# Patient Record
Sex: Female | Born: 1970 | Race: White | Hispanic: No | Marital: Single | Smoking: Never smoker
Health system: Southern US, Community
[De-identification: ages and names within clinical notes are randomized; demographics above are authoritative.]

## PROBLEM LIST (undated history)

## (undated) DIAGNOSIS — G8929 Other chronic pain: Secondary | ICD-10-CM

## (undated) DIAGNOSIS — N319 Neuromuscular dysfunction of bladder, unspecified: Secondary | ICD-10-CM

## (undated) DIAGNOSIS — G629 Polyneuropathy, unspecified: Secondary | ICD-10-CM

## (undated) DIAGNOSIS — F329 Major depressive disorder, single episode, unspecified: Secondary | ICD-10-CM

## (undated) DIAGNOSIS — I10 Essential (primary) hypertension: Secondary | ICD-10-CM

## (undated) DIAGNOSIS — G4730 Sleep apnea, unspecified: Secondary | ICD-10-CM

## (undated) DIAGNOSIS — W3400XA Accidental discharge from unspecified firearms or gun, initial encounter: Secondary | ICD-10-CM

## (undated) DIAGNOSIS — E119 Type 2 diabetes mellitus without complications: Secondary | ICD-10-CM

## (undated) DIAGNOSIS — F32A Depression, unspecified: Secondary | ICD-10-CM

## (undated) DIAGNOSIS — J45909 Unspecified asthma, uncomplicated: Secondary | ICD-10-CM

## (undated) DIAGNOSIS — K219 Gastro-esophageal reflux disease without esophagitis: Secondary | ICD-10-CM

## (undated) DIAGNOSIS — F419 Anxiety disorder, unspecified: Secondary | ICD-10-CM

## (undated) DIAGNOSIS — E785 Hyperlipidemia, unspecified: Secondary | ICD-10-CM

## (undated) HISTORY — DX: Sleep apnea, unspecified: G47.30

## (undated) HISTORY — DX: Other chronic pain: G89.29

## (undated) HISTORY — PX: APPENDECTOMY: SHX54

## (undated) HISTORY — DX: Neuromuscular dysfunction of bladder, unspecified: N31.9

## (undated) HISTORY — PX: SPINE SURGERY: SHX786

## (undated) HISTORY — DX: Depression, unspecified: F32.A

## (undated) HISTORY — PX: CHOLECYSTECTOMY: SHX55

## (undated) HISTORY — DX: Gastro-esophageal reflux disease without esophagitis: K21.9

## (undated) HISTORY — PX: TUBAL LIGATION: SHX77

## (undated) HISTORY — DX: Hyperlipidemia, unspecified: E78.5

## (undated) HISTORY — PX: COLON SURGERY: SHX602

## (undated) HISTORY — DX: Major depressive disorder, single episode, unspecified: F32.9

## (undated) HISTORY — PX: ABLATION: SHX5711

## (undated) HISTORY — DX: Unspecified asthma, uncomplicated: J45.909

## (undated) HISTORY — PX: SMALL INTESTINE SURGERY: SHX150

## (undated) HISTORY — DX: Polyneuropathy, unspecified: G62.9

## (undated) HISTORY — DX: Anxiety disorder, unspecified: F41.9

---

## 1998-12-02 DIAGNOSIS — F4310 Post-traumatic stress disorder, unspecified: Secondary | ICD-10-CM | POA: Insufficient documentation

## 1998-12-02 HISTORY — PX: LAMINECTOMY: SHX219

## 2006-06-23 ENCOUNTER — Emergency Department: Payer: Self-pay | Admitting: Emergency Medicine

## 2007-02-05 ENCOUNTER — Emergency Department: Payer: Self-pay | Admitting: Unknown Physician Specialty

## 2009-06-06 DIAGNOSIS — N920 Excessive and frequent menstruation with regular cycle: Secondary | ICD-10-CM | POA: Insufficient documentation

## 2009-12-28 ENCOUNTER — Emergency Department: Payer: Self-pay | Admitting: Emergency Medicine

## 2010-10-01 DIAGNOSIS — N840 Polyp of corpus uteri: Secondary | ICD-10-CM | POA: Insufficient documentation

## 2011-02-20 ENCOUNTER — Emergency Department: Payer: Self-pay | Admitting: Emergency Medicine

## 2013-04-29 LAB — HEPATIC FUNCTION PANEL
ALT: 27 U/L (ref 7–35)
AST: 23 U/L (ref 13–35)
Alkaline Phosphatase: 103 U/L (ref 25–125)
Bilirubin, Total: 0.3 mg/dL

## 2013-04-29 LAB — TSH: TSH: 1.35 u[IU]/mL (ref <=5.90)

## 2013-04-29 LAB — BASIC METABOLIC PANEL
BUN: 14 mg/dL (ref 4–21)
CREATININE: 0.6 mg/dL (ref <=1.1)
Glucose: 115 mg/dL
POTASSIUM: 4.2 mmol/L (ref 3.4–5.3)
SODIUM: 138 mmol/L (ref 137–147)

## 2013-04-29 LAB — CBC AND DIFFERENTIAL
HEMATOCRIT: 38 % (ref 36–46)
Hemoglobin: 13.1 g/dL (ref 12.0–16.0)
NEUTROS ABS: 4 /uL
Platelets: 288 10*3/uL (ref 150–399)
WBC: 6.6 10^3/mL

## 2013-04-29 LAB — LIPID PANEL
CHOLESTEROL: 208 mg/dL — AB (ref 0–200)
HDL: 42 mg/dL (ref 35–70)
LDL Cholesterol: 112 mg/dL
Triglycerides: 271 mg/dL — AB (ref 40–160)

## 2013-04-30 DIAGNOSIS — R739 Hyperglycemia, unspecified: Secondary | ICD-10-CM | POA: Insufficient documentation

## 2013-09-02 LAB — HM PAP SMEAR: HM Pap smear: NORMAL

## 2014-06-17 LAB — HEMOGLOBIN A1C: HEMOGLOBIN A1C: 5.5

## 2015-02-01 ENCOUNTER — Emergency Department: Payer: Self-pay | Admitting: Emergency Medicine

## 2015-02-01 DIAGNOSIS — M545 Low back pain: Secondary | ICD-10-CM | POA: Diagnosis not present

## 2015-04-06 DIAGNOSIS — H6121 Impacted cerumen, right ear: Secondary | ICD-10-CM | POA: Diagnosis not present

## 2015-04-06 DIAGNOSIS — L309 Dermatitis, unspecified: Secondary | ICD-10-CM | POA: Diagnosis not present

## 2015-04-25 DIAGNOSIS — J069 Acute upper respiratory infection, unspecified: Secondary | ICD-10-CM | POA: Diagnosis not present

## 2015-05-04 ENCOUNTER — Telehealth: Payer: Self-pay | Admitting: Family Medicine

## 2015-05-04 NOTE — Telephone Encounter (Signed)
Pt called to request a refill for OXYCODONE 5MG .  AV#[phone]/WGCB#[phone]/MJ

## 2015-06-01 ENCOUNTER — Other Ambulatory Visit: Payer: Self-pay | Admitting: Family Medicine

## 2015-06-01 DIAGNOSIS — G8929 Other chronic pain: Secondary | ICD-10-CM | POA: Insufficient documentation

## 2015-06-01 DIAGNOSIS — G8921 Chronic pain due to trauma: Secondary | ICD-10-CM

## 2015-06-01 MED ORDER — OXYCODONE HCL 5 MG PO TABS: ORAL_TABLET | ORAL | Status: DC

## 2015-06-01 NOTE — Telephone Encounter (Signed)
Last Refill 05/05/2015  Thanks,   -Vernona RiegerLaura

## 2015-06-01 NOTE — Telephone Encounter (Signed)
Prescription printed. Please notify patient it is ready for pick up. Thanks- Dr. Naomia Lenderman.  

## 2015-06-01 NOTE — Telephone Encounter (Signed)
This is a Dr. Sherrie MustacheFisher pt. Pt contacted office for refill request on the following medications: Oxycodone 5 mg Thanks TNP

## 2015-07-03 ENCOUNTER — Other Ambulatory Visit: Payer: Self-pay | Admitting: Family Medicine

## 2015-07-03 DIAGNOSIS — G8921 Chronic pain due to trauma: Secondary | ICD-10-CM

## 2015-07-03 NOTE — Telephone Encounter (Signed)
Pt contacted office for refill request on the following medications:  OXYCODONE  IMMEDIATE REL TABS.  UJ#[phone]/FA

## 2015-07-04 MED ORDER — OXYCODONE HCL 5 MG PO TABS: ORAL_TABLET | ORAL | Status: DC

## 2015-08-03 ENCOUNTER — Other Ambulatory Visit: Payer: Self-pay | Admitting: Family Medicine

## 2015-08-03 DIAGNOSIS — G8921 Chronic pain due to trauma: Secondary | ICD-10-CM

## 2015-08-03 NOTE — Telephone Encounter (Signed)
Pt contacted office for refill request on the following medications: oxyCODONE (OXY IR/ROXICODONE) 5 MG immediate release tablet. Pt would like this ready by tomorrow afternoon due to the Holiday and that we will be closed Monday. Thanks TNP

## 2015-08-04 ENCOUNTER — Telehealth: Payer: Self-pay | Admitting: Family Medicine

## 2015-08-04 MED ORDER — OXYCODONE HCL 5 MG PO TABS: ORAL_TABLET | ORAL | Status: DC

## 2015-08-31 ENCOUNTER — Other Ambulatory Visit: Payer: Self-pay | Admitting: Family Medicine

## 2015-08-31 DIAGNOSIS — G8921 Chronic pain due to trauma: Secondary | ICD-10-CM

## 2015-08-31 NOTE — Telephone Encounter (Signed)
Pt contacted office for refill request on the following medications:  oxyCODONE (OXY IR/ROXICODONE) 5 MG immediate release tablet.  CB#[phone]/MW ° °

## 2015-09-01 MED ORDER — OXYCODONE HCL 5 MG PO TABS: ORAL_TABLET | ORAL | Status: DC

## 2015-09-01 NOTE — Telephone Encounter (Signed)
Last written on 08/04/15 with no refills. Last OV was 04/25/15.

## 2015-10-05 ENCOUNTER — Other Ambulatory Visit: Payer: Self-pay | Admitting: Family Medicine

## 2015-10-05 ENCOUNTER — Telehealth: Payer: Self-pay | Admitting: Family Medicine

## 2015-10-05 DIAGNOSIS — G8921 Chronic pain due to trauma: Secondary | ICD-10-CM

## 2015-10-05 NOTE — Telephone Encounter (Signed)
Pt contacted office for refill request on the following medications:  oxyCODONE (OXY IR/ROXICODONE) 5 MG immediate release tablet.  ZO#[phone]/WJCB#[phone]/MW

## 2015-10-05 NOTE — Telephone Encounter (Signed)
Pt contacted office for refill request on the following medications[facility] [address]  CB#[phone]/MW    Gabapentin 600mg   Tizanidine HCT 50mg   Tramadol HCI 50mg   Alprazolam 0.5mg   Sertraline HCI 100mg   MetroGel-Viginal 0.75%

## 2015-10-06 ENCOUNTER — Other Ambulatory Visit: Payer: Self-pay | Admitting: Family Medicine

## 2015-10-06 MED ORDER — GABAPENTIN 600 MG PO TABS: 600.0000 mg | ORAL_TABLET | Freq: Three times a day (TID) | ORAL | Status: DC

## 2015-10-06 MED ORDER — SERTRALINE HCL 100 MG PO TABS: 100.0000 mg | ORAL_TABLET | Freq: Every day | ORAL | Status: DC

## 2015-10-06 MED ORDER — TIZANIDINE HCL 4 MG PO TABS: ORAL_TABLET | ORAL | Status: DC

## 2015-10-06 MED ORDER — ALPRAZOLAM 0.5 MG PO TABS: 0.2500 mg | ORAL_TABLET | Freq: Every day | ORAL | Status: DC | PRN

## 2015-10-06 MED ORDER — TRAMADOL HCL 50 MG PO TABS: 50.0000 mg | ORAL_TABLET | Freq: Three times a day (TID) | ORAL | Status: DC | PRN

## 2015-10-06 MED ORDER — OXYCODONE HCL 5 MG PO TABS
ORAL_TABLET | ORAL | Status: DC
Start: 2015-10-06 — End: 2015-11-03

## 2015-10-10 ENCOUNTER — Other Ambulatory Visit: Payer: Self-pay | Admitting: Family Medicine

## 2015-11-03 ENCOUNTER — Other Ambulatory Visit: Payer: Self-pay | Admitting: Family Medicine

## 2015-11-03 DIAGNOSIS — G8921 Chronic pain due to trauma: Secondary | ICD-10-CM

## 2015-11-03 MED ORDER — OXYCODONE HCL 5 MG PO TABS: ORAL_TABLET | ORAL | Status: DC

## 2015-11-03 NOTE — Telephone Encounter (Signed)
Pt call for a refill oxyCODONE (OXY IR/ROXICODONE) 5 MG immediate release tablet 10/06/15 -- Malva Limesonald E Fisher, MD Take 1 and 1/2 three times a day She will run out Saturday  Call back is [phone]  Thanks Barth Kirkseri

## 2015-12-01 ENCOUNTER — Telehealth: Payer: Self-pay | Admitting: Family Medicine

## 2015-12-01 DIAGNOSIS — G8921 Chronic pain due to trauma: Secondary | ICD-10-CM

## 2015-12-01 NOTE — Telephone Encounter (Signed)
Pt needs refil oxyCODONE (OXY IR/ROXICODONE) 5 MG immediate release tablet  Can pick up Tuesday.  Thanks, Fortune Brandsteri

## 2015-12-01 NOTE — Telephone Encounter (Signed)
Last OV: 04/25/2015  Last Refill: 11/03/2015

## 2015-12-05 MED ORDER — OXYCODONE HCL 5 MG PO TABS: ORAL_TABLET | ORAL | Status: DC

## 2015-12-05 NOTE — Telephone Encounter (Signed)
Notified rx is ready to pick up.

## 2015-12-05 NOTE — Telephone Encounter (Signed)
Pt has called back asking about refill.  Call back is  [phone]

## 2016-01-02 ENCOUNTER — Other Ambulatory Visit: Payer: Self-pay | Admitting: Family Medicine

## 2016-01-02 DIAGNOSIS — G8921 Chronic pain due to trauma: Secondary | ICD-10-CM

## 2016-01-02 MED ORDER — OXYCODONE HCL 5 MG PO TABS: ORAL_TABLET | ORAL | Status: DC

## 2016-01-02 NOTE — Telephone Encounter (Signed)
Pt contacted office for refill request on the following medications: °oxyCODONE (OXY IR/ROXICODONE) 5 MG immediate release tablet. Thanks TNP °

## 2016-01-31 ENCOUNTER — Other Ambulatory Visit: Payer: Self-pay | Admitting: Family Medicine

## 2016-01-31 DIAGNOSIS — G8921 Chronic pain due to trauma: Secondary | ICD-10-CM

## 2016-01-31 NOTE — Telephone Encounter (Signed)
Pt needs refill on her oxyCODONE (OXY IR/ROXICODONE) 5 MG immediate release tablet  Thanks teri

## 2016-02-01 MED ORDER — OXYCODONE HCL 5 MG PO TABS
ORAL_TABLET | ORAL | Status: DC
Start: 2016-02-01 — End: 2016-03-01

## 2016-02-03 ENCOUNTER — Other Ambulatory Visit: Payer: Self-pay | Admitting: Family Medicine

## 2016-02-05 DIAGNOSIS — R609 Edema, unspecified: Secondary | ICD-10-CM | POA: Insufficient documentation

## 2016-02-05 DIAGNOSIS — Z8742 Personal history of other diseases of the female genital tract: Secondary | ICD-10-CM | POA: Insufficient documentation

## 2016-02-05 DIAGNOSIS — M2550 Pain in unspecified joint: Secondary | ICD-10-CM | POA: Insufficient documentation

## 2016-02-05 DIAGNOSIS — K219 Gastro-esophageal reflux disease without esophagitis: Secondary | ICD-10-CM | POA: Insufficient documentation

## 2016-02-05 DIAGNOSIS — O009 Unspecified ectopic pregnancy without intrauterine pregnancy: Secondary | ICD-10-CM | POA: Insufficient documentation

## 2016-02-05 DIAGNOSIS — F32A Depression, unspecified: Secondary | ICD-10-CM | POA: Insufficient documentation

## 2016-02-05 DIAGNOSIS — Z8349 Family history of other endocrine, nutritional and metabolic diseases: Secondary | ICD-10-CM | POA: Insufficient documentation

## 2016-02-05 DIAGNOSIS — F329 Major depressive disorder, single episode, unspecified: Secondary | ICD-10-CM | POA: Insufficient documentation

## 2016-02-05 DIAGNOSIS — E669 Obesity, unspecified: Secondary | ICD-10-CM | POA: Insufficient documentation

## 2016-02-05 DIAGNOSIS — F410 Panic disorder [episodic paroxysmal anxiety] without agoraphobia: Secondary | ICD-10-CM | POA: Insufficient documentation

## 2016-02-05 DIAGNOSIS — H6120 Impacted cerumen, unspecified ear: Secondary | ICD-10-CM | POA: Insufficient documentation

## 2016-02-05 DIAGNOSIS — E781 Pure hyperglyceridemia: Secondary | ICD-10-CM | POA: Insufficient documentation

## 2016-02-05 DIAGNOSIS — L309 Dermatitis, unspecified: Secondary | ICD-10-CM | POA: Insufficient documentation

## 2016-02-05 NOTE — Telephone Encounter (Signed)
Please call in tramadol.  

## 2016-02-05 NOTE — Telephone Encounter (Signed)
Rx called in to pharmacy. 

## 2016-02-06 ENCOUNTER — Ambulatory Visit: Payer: Self-pay | Admitting: Family Medicine

## 2016-02-09 ENCOUNTER — Other Ambulatory Visit: Payer: Self-pay | Admitting: Family Medicine

## 2016-02-09 NOTE — Telephone Encounter (Signed)
Please call in alprazolam.  

## 2016-02-09 NOTE — Telephone Encounter (Signed)
Rx called in to pharmacy. 

## 2016-02-12 ENCOUNTER — Encounter: Payer: Self-pay | Admitting: Physician Assistant

## 2016-02-12 ENCOUNTER — Ambulatory Visit (INDEPENDENT_AMBULATORY_CARE_PROVIDER_SITE_OTHER): Payer: Medicare Other | Admitting: Physician Assistant

## 2016-02-12 VITALS — BP 110/78 | HR 102 | Temp 98.0°F | Resp 16 | Wt 200.0 lb

## 2016-02-12 DIAGNOSIS — E781 Pure hyperglyceridemia: Secondary | ICD-10-CM | POA: Diagnosis not present

## 2016-02-12 DIAGNOSIS — Z8742 Personal history of other diseases of the female genital tract: Secondary | ICD-10-CM | POA: Diagnosis not present

## 2016-02-12 DIAGNOSIS — R739 Hyperglycemia, unspecified: Secondary | ICD-10-CM | POA: Diagnosis not present

## 2016-02-12 DIAGNOSIS — Z0000 Encounter for general adult medical examination without abnormal findings: Secondary | ICD-10-CM

## 2016-02-12 DIAGNOSIS — Z124 Encounter for screening for malignant neoplasm of cervix: Secondary | ICD-10-CM

## 2016-02-12 DIAGNOSIS — Z1239 Encounter for other screening for malignant neoplasm of breast: Secondary | ICD-10-CM | POA: Diagnosis not present

## 2016-02-12 NOTE — Patient Instructions (Addendum)
Health Maintenance, Female Adopting a healthy lifestyle and getting preventive care can go a long way to promote health and wellness. Talk with your health care provider about what schedule of regular examinations is right for you. This is a good chance for you to check in with your provider about disease prevention and staying healthy. In between checkups, there are plenty of things you can do on your own. Experts have done a lot of research about which lifestyle changes and preventive measures are most likely to keep you healthy. Ask your health care provider for more information. WEIGHT AND DIET  Eat a healthy diet  Be sure to include plenty of vegetables, fruits, low-fat dairy products, and lean protein.  Do not eat a lot of foods high in solid fats, added sugars, or salt.  Get regular exercise. This is one of the most important things you can do for your health.  Most adults should exercise for at least 150 minutes each week. The exercise should increase your heart rate and make you sweat (moderate-intensity exercise).  Most adults should also do strengthening exercises at least twice a week. This is in addition to the moderate-intensity exercise.  Maintain a healthy weight  Body mass index (BMI) is a measurement that can be used to identify possible weight problems. It estimates body fat based on height and weight. Your health care provider can help determine your BMI and help you achieve or maintain a healthy weight.  For females 28 years of age and older:   A BMI below 18.5 is considered underweight.  A BMI of 18.5 to 24.9 is normal.  A BMI of 25 to 29.9 is considered overweight.  A BMI of 30 and above is considered obese.  Watch levels of cholesterol and blood lipids  You should start having your blood tested for lipids and cholesterol at 43 years of age, then have this test every 5 years.  You may need to have your cholesterol levels checked more often if:  Your lipid  or cholesterol levels are high.  You are older than 43 years of age.  You are at high risk for heart disease.  CANCER SCREENING   Lung Cancer  Lung cancer screening is recommended for adults 75-66 years old who are at high risk for lung cancer because of a history of smoking.  A yearly low-dose CT scan of the lungs is recommended for people who:  Currently smoke.  Have quit within the past 15 years.  Have at least a 30-pack-year history of smoking. A pack year is smoking an average of one pack of cigarettes a day for 1 year.  Yearly screening should continue until it has been 15 years since you quit.  Yearly screening should stop if you develop a health problem that would prevent you from having lung cancer treatment.  Breast Cancer  Practice breast self-awareness. This means understanding how your breasts normally appear and feel.  It also means doing regular breast self-exams. Let your health care provider know about any changes, no matter how small.  If you are in your 20s or 30s, you should have a clinical breast exam (CBE) by a health care provider every 1-3 years as part of a regular health exam.  If you are 25 or older, have a CBE every year. Also consider having a breast X-ray (mammogram) every year.  If you have a family history of breast cancer, talk to your health care provider about genetic screening.  If you  are at high risk for breast cancer, talk to your health care provider about having an MRI and a mammogram every year.  Breast cancer gene (BRCA) assessment is recommended for women who have family members with BRCA-related cancers. BRCA-related cancers include:  Breast.  Ovarian.  Tubal.  Peritoneal cancers.  Results of the assessment will determine the need for genetic counseling and BRCA1 and BRCA2 testing. Cervical Cancer Your health care provider may recommend that you be screened regularly for cancer of the pelvic organs (ovaries, uterus, and  vagina). This screening involves a pelvic examination, including checking for microscopic changes to the surface of your cervix (Pap test). You may be encouraged to have this screening done every 3 years, beginning at age 21.  For women ages 30-65, health care providers may recommend pelvic exams and Pap testing every 3 years, or they may recommend the Pap and pelvic exam, combined with testing for human papilloma virus (HPV), every 5 years. Some types of HPV increase your risk of cervical cancer. Testing for HPV may also be done on women of any age with unclear Pap test results.  Other health care providers may not recommend any screening for nonpregnant women who are considered low risk for pelvic cancer and who do not have symptoms. Ask your health care provider if a screening pelvic exam is right for you.  If you have had past treatment for cervical cancer or a condition that could lead to cancer, you need Pap tests and screening for cancer for at least 20 years after your treatment. If Pap tests have been discontinued, your risk factors (such as having a new sexual partner) need to be reassessed to determine if screening should resume. Some women have medical problems that increase the chance of getting cervical cancer. In these cases, your health care provider may recommend more frequent screening and Pap tests. Colorectal Cancer  This type of cancer can be detected and often prevented.  Routine colorectal cancer screening usually begins at 43 years of age and continues through 43 years of age.  Your health care provider may recommend screening at an earlier age if you have risk factors for colon cancer.  Your health care provider may also recommend using home test kits to check for hidden blood in the stool.  A small camera at the end of a tube can be used to examine your colon directly (sigmoidoscopy or colonoscopy). This is done to check for the earliest forms of colorectal  cancer.  Routine screening usually begins at age 50.  Direct examination of the colon should be repeated every 5-10 years through 43 years of age. However, you may need to be screened more often if early forms of precancerous polyps or small growths are found. Skin Cancer  Check your skin from head to toe regularly.  Tell your health care provider about any new moles or changes in moles, especially if there is a change in a mole's shape or color.  Also tell your health care provider if you have a mole that is larger than the size of a pencil eraser.  Always use sunscreen. Apply sunscreen liberally and repeatedly throughout the day.  Protect yourself by wearing long sleeves, pants, a wide-brimmed hat, and sunglasses whenever you are outside. HEART DISEASE, DIABETES, AND HIGH BLOOD PRESSURE   High blood pressure causes heart disease and increases the risk of stroke. High blood pressure is more likely to develop in:  People who have blood pressure in the high end   of the normal range (130-139/85-89 mm Hg).  People who are overweight or obese.  People who are African American.  If you are 38-23 years of age, have your blood pressure checked every 3-5 years. If you are 61 years of age or older, have your blood pressure checked every year. You should have your blood pressure measured twice--once when you are at a hospital or clinic, and once when you are not at a hospital or clinic. Record the average of the two measurements. To check your blood pressure when you are not at a hospital or clinic, you can use:  An automated blood pressure machine at a pharmacy.  A home blood pressure monitor.  If you are between 45 years and 39 years old, ask your health care provider if you should take aspirin to prevent strokes.  Have regular diabetes screenings. This involves taking a blood sample to check your fasting blood sugar level.  If you are at a normal weight and have a low risk for diabetes,  have this test once every three years after 43 years of age.  If you are overweight and have a high risk for diabetes, consider being tested at a younger age or more often. PREVENTING INFECTION  Hepatitis B  If you have a higher risk for hepatitis B, you should be screened for this virus. You are considered at high risk for hepatitis B if:  You were born in a country where hepatitis B is common. Ask your health care provider which countries are considered high risk.  Your parents were born in a high-risk country, and you have not been immunized against hepatitis B (hepatitis B vaccine).  You have HIV or AIDS.  You use needles to inject street drugs.  You live with someone who has hepatitis B.  You have had sex with someone who has hepatitis B.  You get hemodialysis treatment.  You take certain medicines for conditions, including cancer, organ transplantation, and autoimmune conditions. Hepatitis C  Blood testing is recommended for:  Everyone born from [birth year] through 1963.  Anyone with known risk factors for hepatitis C. Sexually transmitted infections (STIs)  You should be screened for sexually transmitted infections (STIs) including gonorrhea and chlamydia if:  You are sexually active and are younger than 43 years of age.  You are older than 43 years of age and your health care provider tells you that you are at risk for this type of infection.  Your sexual activity has changed since you were last screened and you are at an increased risk for chlamydia or gonorrhea. Ask your health care provider if you are at risk.  If you do not have HIV, but are at risk, it may be recommended that you take a prescription medicine daily to prevent HIV infection. This is called pre-exposure prophylaxis (PrEP). You are considered at risk if:  You are sexually active and do not regularly use condoms or know the HIV status of your partner(s).  You take drugs by injection.  You are sexually  active with a partner who has HIV. Talk with your health care provider about whether you are at high risk of being infected with HIV. If you choose to begin PrEP, you should first be tested for HIV. You should then be tested every 3 months for as long as you are taking PrEP.  PREGNANCY   If you are premenopausal and you may become pregnant, ask your health care provider about preconception counseling.  If you may  become pregnant, take 400 to 800 micrograms (mcg) of folic acid every day.  If you want to prevent pregnancy, talk to your health care provider about birth control (contraception). OSTEOPOROSIS AND MENOPAUSE   Osteoporosis is a disease in which the bones lose minerals and strength with aging. This can result in serious bone fractures. Your risk for osteoporosis can be identified using a bone density scan.  If you are 61 years of age or older, or if you are at risk for osteoporosis and fractures, ask your health care provider if you should be screened.  Ask your health care provider whether you should take a calcium or vitamin D supplement to lower your risk for osteoporosis.  Menopause may have certain physical symptoms and risks.  Hormone replacement therapy may reduce some of these symptoms and risks. Talk to your health care provider about whether hormone replacement therapy is right for you.  HOME CARE INSTRUCTIONS   Schedule regular health, dental, and eye exams.  Stay current with your immunizations.   Do not use any tobacco products including cigarettes, chewing tobacco, or electronic cigarettes.  If you are pregnant, do not drink alcohol.  If you are breastfeeding, limit how much and how often you drink alcohol.  Limit alcohol intake to no more than 1 drink per day for nonpregnant women. One drink equals 12 ounces of beer, 5 ounces of wine, or 1 ounces of hard liquor.  Do not use street drugs.  Do not share needles.  Ask your health care provider for help if  you need support or information about quitting drugs.  Tell your health care provider if you often feel depressed.  Tell your health care provider if you have ever been abused or do not feel safe at home.   This information is not intended to replace advice given to you by your health care provider. Make sure you discuss any questions you have with your health care provider.   Document Released: 06/03/2011 Document Revised: 12/09/2014 Document Reviewed: 10/20/2013 Elsevier Interactive Patient Education [employer].
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
## 2016-02-12 NOTE — Addendum Note (Signed)
Addended by: Margaretann LovelessBURNETTE, JENNIFER M on: 02/12/2016 11:24 AM   Modules accepted: Level of Service

## 2016-02-13 ENCOUNTER — Telehealth: Payer: Self-pay

## 2016-02-13 LAB — LIPID PANEL
Chol/HDL Ratio: 5.7 ratio units — ABNORMAL HIGH (ref 0.0–4.4)
Cholesterol, Total: 218 mg/dL — ABNORMAL HIGH (ref 100–199)
HDL: 38 mg/dL — ABNORMAL LOW (ref >39)
LDL Calculated: 120 mg/dL — ABNORMAL HIGH (ref 0–99)
Triglycerides: 302 mg/dL — ABNORMAL HIGH (ref 0–149)
VLDL Cholesterol Cal: 60 mg/dL — ABNORMAL HIGH (ref 5–40)

## 2016-02-13 LAB — CBC WITH DIFFERENTIAL/PLATELET
Basophils Absolute: 0 10*3/uL (ref 0.0–0.2)
Basos: 0 %
EOS (ABSOLUTE): 0.1 10*3/uL (ref 0.0–0.4)
EOS: 1 %
HEMATOCRIT: 41.2 % (ref 34.0–46.6)
HEMOGLOBIN: 13.9 g/dL (ref 11.1–15.9)
IMMATURE GRANULOCYTES: 1 %
Immature Grans (Abs): 0.1 10*3/uL (ref 0.0–0.1)
LYMPHS: 26 %
Lymphocytes Absolute: 1.9 10*3/uL (ref 0.7–3.1)
MCH: 29.4 pg (ref 26.6–33.0)
MCHC: 33.7 g/dL (ref 31.5–35.7)
MCV: 87 fL (ref 79–97)
MONOCYTES: 5 %
Monocytes Absolute: 0.3 10*3/uL (ref 0.1–0.9)
NEUTROS PCT: 67 %
Neutrophils Absolute: 4.9 10*3/uL (ref 1.4–7.0)
Platelets: 299 10*3/uL (ref 150–379)
RBC: 4.72 x10E6/uL (ref 3.77–5.28)
RDW: 12.5 % (ref 12.3–15.4)
WBC: 7.2 10*3/uL (ref 3.4–10.8)

## 2016-02-13 LAB — COMPREHENSIVE METABOLIC PANEL
ALT: 39 IU/L — ABNORMAL HIGH (ref 0–32)
AST: 26 IU/L (ref 0–40)
Albumin/Globulin Ratio: 1.5 (ref 1.2–2.2)
Albumin: 4.6 g/dL (ref 3.5–5.5)
Alkaline Phosphatase: 113 IU/L (ref 39–117)
BUN / CREAT RATIO: 16 (ref 9–23)
BUN: 12 mg/dL (ref 6–24)
Bilirubin Total: 0.5 mg/dL (ref 0.0–1.2)
CALCIUM: 9.7 mg/dL (ref 8.7–10.2)
CHLORIDE: 102 mmol/L (ref 96–106)
CO2: 25 mmol/L (ref 18–29)
CREATININE: 0.75 mg/dL (ref 0.57–1.00)
GFR calc non Af Amer: 99 mL/min/{1.73_m2} (ref >59)
GFR, EST AFRICAN AMERICAN: 114 mL/min/{1.73_m2} (ref >59)
GLOBULIN, TOTAL: 3 g/dL (ref 1.5–4.5)
GLUCOSE: 117 mg/dL — AB (ref 65–99)
POTASSIUM: 4.6 mmol/L (ref 3.5–5.2)
Sodium: 143 mmol/L (ref 134–144)
Total Protein: 7.6 g/dL (ref 6.0–8.5)

## 2016-02-13 LAB — HEMOGLOBIN A1C
Est. average glucose Bld gHb Est-mCnc: 123 mg/dL
HEMOGLOBIN A1C: 5.9 % — AB (ref 4.8–5.6)

## 2016-02-13 LAB — TSH: TSH: 0.687 u[IU]/mL (ref 0.450–4.500)

## 2016-02-13 NOTE — Telephone Encounter (Signed)
Patient advised as directed below. Voiced understanding.  Thanks,  -Lyvia Mondesir 

## 2016-02-13 NOTE — Telephone Encounter (Signed)
-----   Message from Margaretann LovelessJennifer M Burnette, PA-C sent at 02/13/2016  8:25 AM EDT ----- All labs are within normal limits and stable with exception of cholesterol and HgBA1c. Start limiting carbohydrates, sugars and fatty foods from diet. Try to add physical activity into your routine.  AHA recommends 150 minutes of moderate physical activity per week. I do also think that because of the cholesterol levels you would benefit by adding fish oil 1200mg  twice daily or red yeast rice.  We will recheck fasting cholesterol in 6 months.  If still elevated will recommend starting a cholesterol lowering medication. Thanks! -JB

## 2016-02-15 ENCOUNTER — Telehealth: Payer: Self-pay

## 2016-02-15 LAB — PAP IG AND HPV HIGH-RISK
HPV, high-risk: NEGATIVE
PAP Smear Comment: 0

## 2016-02-15 NOTE — Telephone Encounter (Signed)
Patient advised as directed below. 

## 2016-02-15 NOTE — Telephone Encounter (Signed)
-----   Message from Margaretann LovelessJennifer M Burnette, PA-C sent at 02/15/2016  8:32 AM EDT ----- Pap is normal.  Negative HPV. Will repeat in 3 years.

## 2016-03-01 ENCOUNTER — Other Ambulatory Visit: Payer: Self-pay | Admitting: Family Medicine

## 2016-03-01 DIAGNOSIS — G8921 Chronic pain due to trauma: Secondary | ICD-10-CM

## 2016-03-05 NOTE — Telephone Encounter (Signed)
Pt called to see if the RX for oxyCODONE (OXY IR/ROXICODONE) 5 MG immediate release tablet was ready for pick up. Pt called in the request on Friday 03/01/16. Please advise. Thanks TNP

## 2016-03-06 MED ORDER — OXYCODONE HCL 5 MG PO TABS: ORAL_TABLET | ORAL | Status: DC

## 2016-03-06 NOTE — Telephone Encounter (Signed)
Pt called back about RX. I spoke with Roshena and she stated to advise pt that she would contact pt within the hour after speaking with Dr. Sherrie MustacheFisher. Thanks TNP

## 2016-03-06 NOTE — Telephone Encounter (Signed)
Called patient and advised her that her prescription is ready for pick up. Prescription placed up front for pick up.

## 2016-03-06 NOTE — Telephone Encounter (Signed)
Patient requested this medication 5 days ago and hasn't heard back yet. Patient is upset because this has not been done.

## 2016-04-01 ENCOUNTER — Other Ambulatory Visit: Payer: Self-pay | Admitting: Family Medicine

## 2016-04-01 DIAGNOSIS — G8921 Chronic pain due to trauma: Secondary | ICD-10-CM

## 2016-04-01 NOTE — Telephone Encounter (Signed)
Patient is requesting a refill for   oxyCODONE (OXY IR/ROXICODONE) 5 MG immediate release tablet      Call [phone] when ready

## 2016-04-02 MED ORDER — OXYCODONE HCL 5 MG PO TABS
ORAL_TABLET | ORAL | Status: DC
Start: 2016-04-02 — End: 2016-05-03

## 2016-05-03 ENCOUNTER — Other Ambulatory Visit: Payer: Self-pay | Admitting: Family Medicine

## 2016-05-03 DIAGNOSIS — G8921 Chronic pain due to trauma: Secondary | ICD-10-CM

## 2016-05-03 MED ORDER — OXYCODONE HCL 5 MG PO TABS: ORAL_TABLET | ORAL | Status: DC

## 2016-05-03 NOTE — Telephone Encounter (Signed)
Pt contacted office for refill request on the following medications: oxyCODONE (OXY IR/ROXICODONE) 5 MG immediate release tablet. Last written: 05/03/16 Last OV: 02/12/16 Please advise. Thanks TNP

## 2016-05-06 ENCOUNTER — Other Ambulatory Visit: Payer: Self-pay | Admitting: Family Medicine

## 2016-05-07 NOTE — Telephone Encounter (Signed)
Rx called in to pharmacy. 

## 2016-05-07 NOTE — Telephone Encounter (Signed)
Please call in alprazolam.  

## 2016-05-24 ENCOUNTER — Ambulatory Visit: Payer: Medicare Other | Admitting: Family Medicine

## 2016-06-05 ENCOUNTER — Ambulatory Visit (INDEPENDENT_AMBULATORY_CARE_PROVIDER_SITE_OTHER): Payer: Medicare Other | Admitting: Family Medicine

## 2016-06-05 ENCOUNTER — Encounter: Payer: Self-pay | Admitting: Family Medicine

## 2016-06-05 ENCOUNTER — Other Ambulatory Visit: Payer: Self-pay | Admitting: Family Medicine

## 2016-06-05 VITALS — BP 140/84 | HR 102 | Temp 98.3°F | Resp 16 | Wt 208.0 lb

## 2016-06-05 DIAGNOSIS — F32A Depression, unspecified: Secondary | ICD-10-CM

## 2016-06-05 DIAGNOSIS — G8921 Chronic pain due to trauma: Secondary | ICD-10-CM | POA: Diagnosis not present

## 2016-06-05 DIAGNOSIS — Z0283 Encounter for blood-alcohol and blood-drug test: Secondary | ICD-10-CM

## 2016-06-05 DIAGNOSIS — F329 Major depressive disorder, single episode, unspecified: Secondary | ICD-10-CM

## 2016-06-05 DIAGNOSIS — F410 Panic disorder [episodic paroxysmal anxiety] without agoraphobia: Secondary | ICD-10-CM

## 2016-06-05 DIAGNOSIS — Z0189 Encounter for other specified special examinations: Secondary | ICD-10-CM | POA: Diagnosis not present

## 2016-06-05 MED ORDER — OXYCODONE HCL 5 MG PO TABS: ORAL_TABLET | ORAL | Status: DC
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
## 2016-06-06 LAB — PAIN MGT SCRN (14 DRUGS), UR
Amphetamine Screen, Ur: NEGATIVE ng/mL
BUPRENORPHINE, URINE: NEGATIVE ng/mL
Barbiturate Screen, Ur: NEGATIVE ng/mL
Benzodiazepine Screen, Urine: NEGATIVE ng/mL
CREATININE(CRT), U: 180.6 mg/dL (ref 20.0–300.0)
Cannabinoids Ur Ql Scn: NEGATIVE ng/mL
Cocaine(Metab.)Screen, Urine: POSITIVE ng/mL
FENTANYL, URINE: NEGATIVE pg/mL
METHADONE SCREEN, URINE: NEGATIVE ng/mL
Meperidine Screen, Urine: NEGATIVE ng/mL
OXYCODONE+OXYMORPHONE UR QL SCN: POSITIVE ng/mL
Opiate Scrn, Ur: NEGATIVE ng/mL
PCP SCRN UR: NEGATIVE ng/mL
PH UR, DRUG SCRN: 5.5 (ref 4.5–8.9)
Propoxyphene, Screen: NEGATIVE ng/mL
TRAMADOL UR QL SCN: NEGATIVE ng/mL

## 2016-06-07 ENCOUNTER — Telehealth: Payer: Self-pay

## 2016-06-07 NOTE — Telephone Encounter (Signed)
Patient was notified. Patient stated she will be glad come in on Monday 06/10/2016 to recheck urine.

## 2016-06-07 NOTE — Telephone Encounter (Signed)
-----   Message from Malva Limesonald E Fisher, MD sent at 06/07/2016  9:48 AM EDT ----- Urine drug screen is positive for Cocaine. We cannot refill any pain medications for her if she is using illegal drugs. She will need to come in for a repeat drug scree before we can prescribe any more pain medications.

## 2016-06-07 NOTE — Telephone Encounter (Signed)
LMTCB 06/07/2016  Thanks,   -Vernona RiegerLaura

## 2016-06-10 ENCOUNTER — Ambulatory Visit (INDEPENDENT_AMBULATORY_CARE_PROVIDER_SITE_OTHER): Payer: Medicare Other | Admitting: Family Medicine

## 2016-06-10 DIAGNOSIS — R825 Elevated urine levels of drugs, medicaments and biological substances: Secondary | ICD-10-CM

## 2016-06-11 NOTE — Progress Notes (Signed)
Here for repeat drug screen only. No MD visit.

## 2016-06-12 ENCOUNTER — Telehealth: Payer: Self-pay

## 2016-06-12 LAB — PAIN MGT SCRN (14 DRUGS), UR
Amphetamine Screen, Ur: NEGATIVE ng/mL
BUPRENORPHINE, URINE: NEGATIVE ng/mL
Barbiturate Screen, Ur: NEGATIVE ng/mL
Benzodiazepine Screen, Urine: NEGATIVE ng/mL
COCAINE(METAB.) SCREEN, URINE: NEGATIVE ng/mL
Cannabinoids Ur Ql Scn: NEGATIVE ng/mL
Creatinine(Crt), U: 92.2 mg/dL (ref 20.0–300.0)
Fentanyl, Urine: NEGATIVE pg/mL
METHADONE SCREEN, URINE: NEGATIVE ng/mL
Meperidine Screen, Urine: NEGATIVE ng/mL
Opiate Scrn, Ur: POSITIVE ng/mL
Oxycodone+Oxymorphone Ur Ql Scn: POSITIVE ng/mL
PCP Scrn, Ur: NEGATIVE ng/mL
PROPOXYPHENE SCREEN: NEGATIVE ng/mL
Ph of Urine: 7.1 (ref 4.5–8.9)
Tramadol Ur Ql Scn: NEGATIVE ng/mL

## 2016-06-12 NOTE — Telephone Encounter (Signed)
Patient returned phone call. Patient states she had oral surgery 05/20/2016. She had to have a crown replaced that broke off the day before. She states they did give her numbing medication for this surgery. Patient was advised that she should repeat UDS in 1 month. Patient verbalized understanding. Patient says she will be due for a refill on 07/04/2016. Will she be able to get a refill at that time?

## 2016-06-12 NOTE — Telephone Encounter (Signed)
-----   Message from Malva Limesonald E Fisher, MD sent at 06/12/2016  8:29 AM EDT ----- Drug screen shows presence of opioids that she is not being prescribed. This can be due to vicodin, morphine, codeine, or heroin. It can also be caused by eating poppy seeds. I cannot refill her oxycodone until her drug screen is clear.

## 2016-06-12 NOTE — Telephone Encounter (Signed)
Patient advised of results. Patient says there must be some mistake because she has not taken any medications that were not prescribed to her. She says she has not been eating any poppy seeds either. Patient reports that she did have oral surgery recently and they gave her medication during the surgery.  Patient wants to know when she needs to come back in to have her urine rechecked?  I consulted with Dr. Sherrie MustacheFisher. Dr. Sherrie MustacheFisher wants  To know what date she had oral surgery? Per Dr. Sherrie MustacheFisher, patient should repeat UDS in 1 month (mid  To late August). Tried calling patient. Left message for patient to call back.

## 2016-07-04 ENCOUNTER — Other Ambulatory Visit: Payer: Self-pay | Admitting: Family Medicine

## 2016-07-04 DIAGNOSIS — G8921 Chronic pain due to trauma: Secondary | ICD-10-CM

## 2016-07-04 NOTE — Telephone Encounter (Signed)
Pt contacted office for refill request on the following medications: oxyCODONE (OXY IR/ROXICODONE) 5 MG immediate release tablet   Pt stated that she will be out of the medication either tomorrow or Saturday.  Last written: 06/05/16 Last OV: 06/10/16  Please advise. Thanks TNP

## 2016-07-05 MED ORDER — OXYCODONE HCL 5 MG PO TABS: ORAL_TABLET | ORAL | 0 refills | Status: DC

## 2016-07-05 NOTE — Telephone Encounter (Signed)
Patient needs to leave urine sample for drug screen when she comes in to pick up prescription

## 2016-07-08 ENCOUNTER — Other Ambulatory Visit: Payer: Self-pay

## 2016-07-08 ENCOUNTER — Other Ambulatory Visit: Payer: Self-pay | Admitting: Family Medicine

## 2016-07-08 DIAGNOSIS — G8921 Chronic pain due to trauma: Secondary | ICD-10-CM | POA: Diagnosis not present

## 2016-07-08 NOTE — Telephone Encounter (Signed)
Patient was notified.

## 2016-07-09 NOTE — Telephone Encounter (Signed)
Please call in tramadol.  

## 2016-07-10 LAB — PAIN MGT SCRN (14 DRUGS), UR
AMPHETAMINE SCRN UR: NEGATIVE ng/mL
BARBITURATE SCRN UR: NEGATIVE ng/mL
BENZODIAZEPINE SCREEN, URINE: NEGATIVE ng/mL
Buprenorphine, Urine: NEGATIVE ng/mL
COCAINE(METAB.) SCREEN, URINE: NEGATIVE ng/mL
Cannabinoids Ur Ql Scn: NEGATIVE ng/mL
Creatinine(Crt), U: 280.4 mg/dL (ref 20.0–300.0)
Fentanyl, Urine: NEGATIVE pg/mL
MEPERIDINE SCREEN, URINE: NEGATIVE ng/mL
Methadone Scn, Ur: NEGATIVE ng/mL
OPIATE SCRN UR: NEGATIVE ng/mL
Oxycodone+Oxymorphone Ur Ql Scn: NEGATIVE ng/mL
PCP Scrn, Ur: NEGATIVE ng/mL
PROPOXYPHENE SCREEN: NEGATIVE ng/mL
Ph of Urine: 6.2 (ref 4.5–8.9)
Tramadol Ur Ql Scn: NEGATIVE ng/mL

## 2016-07-10 NOTE — Telephone Encounter (Signed)
Rx called in to pharmacy. 

## 2016-07-17 ENCOUNTER — Ambulatory Visit: Payer: Medicare Other | Admitting: Family Medicine

## 2016-08-01 ENCOUNTER — Other Ambulatory Visit: Payer: Self-pay | Admitting: Family Medicine

## 2016-08-01 DIAGNOSIS — G8921 Chronic pain due to trauma: Secondary | ICD-10-CM

## 2016-08-01 MED ORDER — OXYCODONE HCL 5 MG PO TABS: ORAL_TABLET | ORAL | 0 refills | Status: DC

## 2016-08-01 NOTE — Telephone Encounter (Signed)
Pt contacted office for refill request on the following medications:  oxyCODONE (OXY IR/ROXICODONE) 5 MG immediate release tablet.  CB#[phone]/MW ° °

## 2016-08-30 ENCOUNTER — Other Ambulatory Visit: Payer: Self-pay | Admitting: Family Medicine

## 2016-08-30 DIAGNOSIS — G8921 Chronic pain due to trauma: Secondary | ICD-10-CM

## 2016-08-30 NOTE — Telephone Encounter (Signed)
Pt contacted office for refill request on the following medications:  oxyCODONE (OXY IR/ROXICODONE) 5 MG immediate release tablet.  CB#[phone]/MW ° °

## 2016-08-30 NOTE — Telephone Encounter (Signed)
For review, patient has been notified by phone team that you are currently out of the office. KW

## 2016-08-31 MED ORDER — OXYCODONE HCL 5 MG PO TABS: ORAL_TABLET | ORAL | 0 refills | Status: DC

## 2016-09-06 ENCOUNTER — Other Ambulatory Visit: Payer: Self-pay | Admitting: Family Medicine

## 2016-10-07 ENCOUNTER — Telehealth: Payer: Self-pay | Admitting: Family Medicine

## 2016-10-07 DIAGNOSIS — G8921 Chronic pain due to trauma: Secondary | ICD-10-CM

## 2016-10-07 MED ORDER — OXYCODONE HCL 5 MG PO TABS: ORAL_TABLET | ORAL | 0 refills | Status: DC

## 2016-10-07 NOTE — Telephone Encounter (Signed)
Pt needs refill on her oxycodone 5mg   Please call [phone] when ready   thanksTeri

## 2016-10-07 NOTE — Telephone Encounter (Signed)
Last filled 08/31/16, please review. KW

## 2016-10-08 ENCOUNTER — Other Ambulatory Visit: Payer: Self-pay | Admitting: Family Medicine

## 2016-10-08 NOTE — Telephone Encounter (Signed)
Please call in alprazolam.  

## 2016-10-08 NOTE — Telephone Encounter (Signed)
Rx called in to pharmacy. 

## 2016-10-22 ENCOUNTER — Other Ambulatory Visit: Payer: Self-pay | Admitting: Family Medicine

## 2016-11-05 ENCOUNTER — Other Ambulatory Visit: Payer: Self-pay | Admitting: Family Medicine

## 2016-11-05 DIAGNOSIS — G8921 Chronic pain due to trauma: Secondary | ICD-10-CM

## 2016-11-05 MED ORDER — OXYCODONE HCL 5 MG PO TABS: ORAL_TABLET | ORAL | 0 refills | Status: DC

## 2016-11-05 NOTE — Telephone Encounter (Signed)
Pt contacted office for refill request on the following medications:  oxyCODONE (OXY IR/ROXICODONE) 5 MG immediate release tablet.  CB#[phone]/MW ° °

## 2016-11-06 ENCOUNTER — Other Ambulatory Visit: Payer: Self-pay | Admitting: Family Medicine

## 2016-11-07 NOTE — Telephone Encounter (Signed)
Rx called in to pharmacy. 

## 2016-11-07 NOTE — Telephone Encounter (Signed)
Please call in alprazolam.  

## 2016-12-06 ENCOUNTER — Other Ambulatory Visit: Payer: Self-pay | Admitting: Family Medicine

## 2016-12-06 DIAGNOSIS — G8921 Chronic pain due to trauma: Secondary | ICD-10-CM

## 2016-12-06 MED ORDER — OXYCODONE HCL 5 MG PO TABS: ORAL_TABLET | ORAL | 0 refills | Status: DC

## 2016-12-06 NOTE — Telephone Encounter (Signed)
Pt advised. Torien Ramroop Drozdowski, CMA  

## 2016-12-06 NOTE — Telephone Encounter (Signed)
LOV 06/05/2016. Last refill 11/05/2016. Allene DillonEmily Drozdowski, CMA

## 2016-12-06 NOTE — Telephone Encounter (Signed)
Pt contacted office for refill request on the following medications:  oxyCODONE (OXY IR/ROXICODONE) 5 MG immediate release tablet.  CB#[phone]/MW ° °

## 2017-01-07 ENCOUNTER — Other Ambulatory Visit: Payer: Self-pay | Admitting: Family Medicine

## 2017-01-07 DIAGNOSIS — G8921 Chronic pain due to trauma: Secondary | ICD-10-CM

## 2017-01-07 NOTE — Telephone Encounter (Signed)
Pt contacted office for refill request on the following medications:  oxyCODONE (OXY IR/ROXICODONE) 5 MG immediate release tablet.  CB#[phone]/MW ° °

## 2017-01-07 NOTE — Telephone Encounter (Signed)
Last refill 12/06/2016. LOV 06/10/2016. Allene DillonEmily Drozdowski, CMA

## 2017-01-09 MED ORDER — OXYCODONE HCL 5 MG PO TABS: ORAL_TABLET | ORAL | 0 refills | Status: DC

## 2017-01-10 ENCOUNTER — Other Ambulatory Visit: Payer: Self-pay | Admitting: Family Medicine

## 2017-01-11 NOTE — Telephone Encounter (Signed)
Please call in tramadol.  

## 2017-01-13 NOTE — Telephone Encounter (Signed)
Rx called in to pharmacy. 

## 2017-01-14 ENCOUNTER — Ambulatory Visit: Payer: Medicare Other | Admitting: Family Medicine

## 2017-02-04 ENCOUNTER — Telehealth: Payer: Self-pay | Admitting: Family Medicine

## 2017-02-04 DIAGNOSIS — G8921 Chronic pain due to trauma: Secondary | ICD-10-CM

## 2017-02-04 NOTE — Telephone Encounter (Signed)
Last ov 06/10/16 Last filled 01/09/17 Please review. Thank you. sd

## 2017-02-04 NOTE — Telephone Encounter (Signed)
Pt scheduled for Awv w/Mckenzie this Thursday @ 3pm would like to also pick up oxyCODONE (OXY IR/ROXICODONE) 5 MG immediate release tablet while she is here. Please confirm and c/b pt. - knb

## 2017-02-06 ENCOUNTER — Ambulatory Visit (INDEPENDENT_AMBULATORY_CARE_PROVIDER_SITE_OTHER): Payer: Medicare Other

## 2017-02-06 VITALS — BP 130/82 | HR 96 | Temp 99.9°F | Ht 64.0 in | Wt 219.2 lb

## 2017-02-06 DIAGNOSIS — Z0000 Encounter for general adult medical examination without abnormal findings: Secondary | ICD-10-CM

## 2017-02-06 MED ORDER — OXYCODONE HCL 5 MG PO TABS
ORAL_TABLET | ORAL | 0 refills | Status: DC
Start: 2017-02-06 — End: 2017-03-06

## 2017-02-06 NOTE — Patient Instructions (Signed)

## 2017-02-06 NOTE — Progress Notes (Signed)
Subjective:   Alison Landry is a 46 y.o. female who presents for Medicare Annual (Subsequent) preventive examination.  Review of Systems:  N/A  Cardiac Risk Factors include: obesity (BMI >30kg/m2)     Objective:     Vitals: BP 130/82 (BP Location: Right Arm)   Pulse 96   Temp 99.9 F (37.7 C) (Oral)   Ht 5\' 4"  (1.626 m)   Wt 219 lb 3.2 oz (99.4 kg)   BMI 37.63 kg/m   Body mass index is 37.63 kg/m.   Tobacco History  Smoking Status  . Never Smoker  Smokeless Tobacco  . Never Used     Counseling given: Not Answered   Past Medical History:  Diagnosis Date  . Depression    Past Surgical History:  Procedure Laterality Date  . ABLATION     Family History  Problem Relation Age of Onset  . Healthy Mother   . Hypertension Father   . COPD Maternal Grandmother   . Heart disease Maternal Grandfather   . Hypertension Maternal Grandfather   . Heart disease Paternal Grandfather    History  Sexual Activity  . Sexual activity: Not on file    Outpatient Encounter Prescriptions as of 02/06/2017  Medication Sig  . ALPRAZolam (XANAX) 0.5 MG tablet TAKE 1/2 TO 1 TABLET BY MOUTH DAILY AS NEEDED  . gabapentin (NEURONTIN) 600 MG tablet TAKE 1 TABLET BY MOUTH THREE TIMES DAILY  . metroNIDAZOLE (METROGEL) 0.75 % vaginal gel INSERT ONE APPLICATORFUL VAGINALLY AT BEDTIME  . oxyCODONE (OXY IR/ROXICODONE) 5 MG immediate release tablet Take 1 and 1/2 three times a day.  . sertraline (ZOLOFT) 100 MG tablet Take 1 tablet (100 mg total) by mouth daily.  Marland Kitchen tiZANidine (ZANAFLEX) 4 MG tablet Every 4-6 hours as needed  . traMADol (ULTRAM) 50 MG tablet TAKE 1 TABLET BY MOUTH THREE TIMES DAILY AS NEEDED   No facility-administered encounter medications on file as of 02/06/2017.     Activities of Daily Living In your present state of health, do you have any difficulty performing the following activities: 02/06/2017 02/12/2016  Hearing? N N  Vision? Y Y  Difficulty concentrating or making  decisions? Y N  Walking or climbing stairs? Y N  Dressing or bathing? N N  Doing errands, shopping? N N  Preparing Food and eating ? N -  Using the Toilet? N -  In the past six months, have you accidently leaked urine? N -  Do you have problems with loss of bowel control? N -  Managing your Medications? N -  Managing your Finances? N -  Housekeeping or managing your Housekeeping? Y -  Some recent data might be hidden    Patient Care Team: Malva Limes, MD as PCP - General (Family Medicine) Hulan Amato, MD as Referring Physician (Obstetrics and Gynecology)    Assessment:    Exercise Activities and Dietary recommendations Current Exercise Habits: Home exercise routine, Type of exercise: walking, Time (Minutes): 15, Frequency (Times/Week): 3, Weekly Exercise (Minutes/Week): 45, Intensity: Mild  Goals    . Increase water intake          Recommend increasing water intake to 3-4 glasses a day.      Fall Risk Fall Risk  02/06/2017 02/12/2016  Falls in the past year? Yes Yes  Number falls in past yr: 2 or more 2 or more  Injury with Fall? No No  Risk Factor Category  (No Data) -  Follow up Falls prevention discussed -  Depression Screen PHQ 2/9 Scores 02/06/2017 02/12/2016  PHQ - 2 Score 0 2  PHQ- 9 Score - 9     Cognitive Function     6CIT Screen 02/06/2017  What Year? 0 points  What month? 0 points  What time? 0 points  Count back from 20 0 points  Months in reverse 0 points  Repeat phrase 0 points  Total Score 0     There is no immunization history on file for this patient. Screening Tests Health Maintenance  Topic Date Due  . INFLUENZA VACCINE  08/02/2017 (Originally 07/02/2016)  . TETANUS/TDAP  12/02/2026 (Originally 08/04/1992)  . HIV Screening  12/02/2026 (Originally 08/04/1988)  . PAP SMEAR  02/12/2019      Plan:  I have personally reviewed and addressed the Medicare Annual Wellness questionnaire and have noted the following in the patient's chart:    A. Medical and social history B. Use of alcohol, tobacco or illicit drugs  C. Current medications and supplements D. Functional ability and status E.  Nutritional status F.  Physical activity G. Advance directives H. List of other physicians I.  Hospitalizations, surgeries, and ER visits in previous 12 months J.  Vitals K. Screenings such as hearing and vision if needed, cognitive and depression L. Referrals and appointments - none  In addition, I have reviewed and discussed with patient certain preventive protocols, quality metrics, and best practice recommendations. A written personalized care plan for preventive services as well as general preventive health recommendations were provided to patient.  See attached scanned questionnaire for additional information.   Signed,  Hyacinth MeekerMckenzie Jaquay Morneault, LPN Nurse Health Advisor   MD Recommendations: None. Pt declined influenza vaccine, tetanus vaccine and HIV screening.  I have reviewed the health advisor's note, was available for consultation, and agree with documentation and plan  Mila Merryonald Fisher, MD

## 2017-02-12 ENCOUNTER — Encounter: Payer: Medicare Other | Admitting: Physician Assistant

## 2017-02-12 ENCOUNTER — Other Ambulatory Visit: Payer: Self-pay | Admitting: Family Medicine

## 2017-02-12 ENCOUNTER — Ambulatory Visit: Payer: Medicare Other

## 2017-02-13 NOTE — Telephone Encounter (Signed)
Please advise patient it Is time for follow up office visit. OK to call in 30 alprazolam to get by until she can be seen.

## 2017-02-13 NOTE — Telephone Encounter (Signed)
RX called in at [facility]. She has a follow up appointment scheduled for 02/24/2017

## 2017-02-24 ENCOUNTER — Encounter: Payer: Medicare Other | Admitting: Physician Assistant

## 2017-03-05 ENCOUNTER — Other Ambulatory Visit: Payer: Self-pay

## 2017-03-05 DIAGNOSIS — G8921 Chronic pain due to trauma: Secondary | ICD-10-CM

## 2017-03-05 NOTE — Telephone Encounter (Signed)
Patient is coming in to see you tomorrow for an appointment and wanted to see if she can pick this refill up at that time. Please review. Thank you-aa

## 2017-03-06 ENCOUNTER — Other Ambulatory Visit: Payer: Self-pay | Admitting: Family Medicine

## 2017-03-06 ENCOUNTER — Encounter: Payer: Self-pay | Admitting: Physician Assistant

## 2017-03-06 ENCOUNTER — Ambulatory Visit (INDEPENDENT_AMBULATORY_CARE_PROVIDER_SITE_OTHER): Payer: Medicare Other | Admitting: Physician Assistant

## 2017-03-06 VITALS — BP 130/80 | HR 100 | Temp 98.7°F | Resp 16 | Ht 64.0 in | Wt 217.0 lb

## 2017-03-06 DIAGNOSIS — Z1231 Encounter for screening mammogram for malignant neoplasm of breast: Secondary | ICD-10-CM | POA: Diagnosis not present

## 2017-03-06 DIAGNOSIS — A549 Gonococcal infection, unspecified: Secondary | ICD-10-CM

## 2017-03-06 DIAGNOSIS — Z113 Encounter for screening for infections with a predominantly sexual mode of transmission: Secondary | ICD-10-CM | POA: Diagnosis not present

## 2017-03-06 DIAGNOSIS — N760 Acute vaginitis: Secondary | ICD-10-CM | POA: Diagnosis not present

## 2017-03-06 DIAGNOSIS — G8921 Chronic pain due to trauma: Secondary | ICD-10-CM | POA: Diagnosis not present

## 2017-03-06 DIAGNOSIS — Z8349 Family history of other endocrine, nutritional and metabolic diseases: Secondary | ICD-10-CM

## 2017-03-06 DIAGNOSIS — Z1239 Encounter for other screening for malignant neoplasm of breast: Secondary | ICD-10-CM

## 2017-03-06 DIAGNOSIS — A749 Chlamydial infection, unspecified: Secondary | ICD-10-CM

## 2017-03-06 DIAGNOSIS — Z0000 Encounter for general adult medical examination without abnormal findings: Secondary | ICD-10-CM | POA: Diagnosis not present

## 2017-03-06 DIAGNOSIS — B9689 Other specified bacterial agents as the cause of diseases classified elsewhere: Secondary | ICD-10-CM

## 2017-03-06 DIAGNOSIS — N921 Excessive and frequent menstruation with irregular cycle: Secondary | ICD-10-CM

## 2017-03-06 DIAGNOSIS — E781 Pure hyperglyceridemia: Secondary | ICD-10-CM | POA: Diagnosis not present

## 2017-03-06 MED ORDER — NORGESTIM-ETH ESTRAD TRIPHASIC 0.18/0.215/0.25 MG-25 MCG PO TABS: 1.0000 | ORAL_TABLET | Freq: Every day | ORAL | 11 refills | Status: DC

## 2017-03-06 MED ORDER — OXYCODONE HCL 5 MG PO TABS: ORAL_TABLET | ORAL | 0 refills | Status: DC
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
## 2017-03-06 NOTE — Patient Instructions (Signed)
Health Maintenance, Female Adopting a healthy lifestyle and getting preventive care can go a long way to promote health and wellness. Talk with your health care provider about what schedule of regular examinations is right for you. This is a good chance for you to check in with your provider about disease prevention and staying healthy. In between checkups, there are plenty of things you can do on your own. Experts have done a lot of research about which lifestyle changes and preventive measures are most likely to keep you healthy. Ask your health care provider for more information. Weight and diet Eat a healthy diet  Be sure to include plenty of vegetables, fruits, low-fat dairy products, and lean protein.  Do not eat a lot of foods high in solid fats, added sugars, or salt.  Get regular exercise. This is one of the most important things you can do for your health.  Most adults should exercise for at least 150 minutes each week. The exercise should increase your heart rate and make you sweat (moderate-intensity exercise).  Most adults should also do strengthening exercises at least twice a week. This is in addition to the moderate-intensity exercise. Maintain a healthy weight  Body mass index (BMI) is a measurement that can be used to identify possible weight problems. It estimates body fat based on height and weight. Your health care provider can help determine your BMI and help you achieve or maintain a healthy weight.  For females 44 years of age and older:  A BMI below 18.5 is considered underweight.  A BMI of 18.5 to 24.9 is normal.  A BMI of 25 to 29.9 is considered overweight.  A BMI of 30 and above is considered obese. Watch levels of cholesterol and blood lipids  You should start having your blood tested for lipids and cholesterol at 44 years of age, then have this test every 5 years.  You may need to have your cholesterol levels checked more often if:  Your lipid or  cholesterol levels are high.  You are older than 44 years of age.  You are at high risk for heart disease. Cancer screening Lung Cancer  Lung cancer screening is recommended for adults 64-42 years old who are at high risk for lung cancer because of a history of smoking.  A yearly low-dose CT scan of the lungs is recommended for people who:  Currently smoke.  Have quit within the past 15 years.  Have at least a 30-pack-year history of smoking. A pack year is smoking an average of one pack of cigarettes a day for 1 year.  Yearly screening should continue until it has been 15 years since you quit.  Yearly screening should stop if you develop a health problem that would prevent you from having lung cancer treatment. Breast Cancer  Practice breast self-awareness. This means understanding how your breasts normally appear and feel.  It also means doing regular breast self-exams. Let your health care provider know about any changes, no matter how small.  If you are in your 20s or 30s, you should have a clinical breast exam (CBE) by a health care provider every 1-3 years as part of a regular health exam.  If you are 34 or older, have a CBE every year. Also consider having a breast X-ray (mammogram) every year.  If you have a family history of breast cancer, talk to your health care provider about genetic screening.  If you are at high risk for breast cancer, talk  to your health care provider about having an MRI and a mammogram every year.  Breast cancer gene (BRCA) assessment is recommended for women who have family members with BRCA-related cancers. BRCA-related cancers include:  Breast.  Ovarian.  Tubal.  Peritoneal cancers.  Results of the assessment will determine the need for genetic counseling and BRCA1 and BRCA2 testing. Cervical Cancer  Your health care provider may recommend that you be screened regularly for cancer of the pelvic organs (ovaries, uterus, and vagina).  This screening involves a pelvic examination, including checking for microscopic changes to the surface of your cervix (Pap test). You may be encouraged to have this screening done every 3 years, beginning at age 24.  For women ages 66-65, health care providers may recommend pelvic exams and Pap testing every 3 years, or they may recommend the Pap and pelvic exam, combined with testing for human papilloma virus (HPV), every 5 years. Some types of HPV increase your risk of cervical cancer. Testing for HPV may also be done on women of any age with unclear Pap test results.  Other health care providers may not recommend any screening for nonpregnant women who are considered low risk for pelvic cancer and who do not have symptoms. Ask your health care provider if a screening pelvic exam is right for you.  If you have had past treatment for cervical cancer or a condition that could lead to cancer, you need Pap tests and screening for cancer for at least 20 years after your treatment. If Pap tests have been discontinued, your risk factors (such as having a new sexual partner) need to be reassessed to determine if screening should resume. Some women have medical problems that increase the chance of getting cervical cancer. In these cases, your health care provider may recommend more frequent screening and Pap tests. Colorectal Cancer  This type of cancer can be detected and often prevented.  Routine colorectal cancer screening usually begins at 44 years of age and continues through 44 years of age.  Your health care provider may recommend screening at an earlier age if you have risk factors for colon cancer.  Your health care provider may also recommend using home test kits to check for hidden blood in the stool.  A small camera at the end of a tube can be used to examine your colon directly (sigmoidoscopy or colonoscopy). This is done to check for the earliest forms of colorectal cancer.  Routine  screening usually begins at age 41.  Direct examination of the colon should be repeated every 5-10 years through 44 years of age. However, you may need to be screened more often if early forms of precancerous polyps or small growths are found. Skin Cancer  Check your skin from head to toe regularly.  Tell your health care provider about any new moles or changes in moles, especially if there is a change in a mole's shape or color.  Also tell your health care provider if you have a mole that is larger than the size of a pencil eraser.  Always use sunscreen. Apply sunscreen liberally and repeatedly throughout the day.  Protect yourself by wearing long sleeves, pants, a wide-brimmed hat, and sunglasses whenever you are outside. Heart disease, diabetes, and high blood pressure  High blood pressure causes heart disease and increases the risk of stroke. High blood pressure is more likely to develop in:  People who have blood pressure in the high end of the normal range (130-139/85-89 mm Hg).  People who are overweight or obese.  People who are African American.  If you are 59-24 years of age, have your blood pressure checked every 3-5 years. If you are 34 years of age or older, have your blood pressure checked every year. You should have your blood pressure measured twice-once when you are at a hospital or clinic, and once when you are not at a hospital or clinic. Record the average of the two measurements. To check your blood pressure when you are not at a hospital or clinic, you can use:  An automated blood pressure machine at a pharmacy.  A home blood pressure monitor.  If you are between 29 years and 60 years old, ask your health care provider if you should take aspirin to prevent strokes.  Have regular diabetes screenings. This involves taking a blood sample to check your fasting blood sugar level.  If you are at a normal weight and have a low risk for diabetes, have this test once  every three years after 44 years of age.  If you are overweight and have a high risk for diabetes, consider being tested at a younger age or more often. Preventing infection Hepatitis B  If you have a higher risk for hepatitis B, you should be screened for this virus. You are considered at high risk for hepatitis B if:  You were born in a country where hepatitis B is common. Ask your health care provider which countries are considered high risk.  Your parents were born in a high-risk country, and you have not been immunized against hepatitis B (hepatitis B vaccine).  You have HIV or AIDS.  You use needles to inject street drugs.  You live with someone who has hepatitis B.  You have had sex with someone who has hepatitis B.  You get hemodialysis treatment.  You take certain medicines for conditions, including cancer, organ transplantation, and autoimmune conditions. Hepatitis C  Blood testing is recommended for:  Everyone born from [birth year] through 1963.  Anyone with known risk factors for hepatitis C. Sexually transmitted infections (STIs)  You should be screened for sexually transmitted infections (STIs) including gonorrhea and chlamydia if:  You are sexually active and are younger than 44 years of age.  You are older than 44 years of age and your health care provider tells you that you are at risk for this type of infection.  Your sexual activity has changed since you were last screened and you are at an increased risk for chlamydia or gonorrhea. Ask your health care provider if you are at risk.  If you do not have HIV, but are at risk, it may be recommended that you take a prescription medicine daily to prevent HIV infection. This is called pre-exposure prophylaxis (PrEP). You are considered at risk if:  You are sexually active and do not regularly use condoms or know the HIV status of your partner(s).  You take drugs by injection.  You are sexually active with a partner  who has HIV. Talk with your health care provider about whether you are at high risk of being infected with HIV. If you choose to begin PrEP, you should first be tested for HIV. You should then be tested every 3 months for as long as you are taking PrEP. Pregnancy  If you are premenopausal and you may become pregnant, ask your health care provider about preconception counseling.  If you may become pregnant, take 400 to 800 micrograms (mcg) of folic acid  every day.  If you want to prevent pregnancy, talk to your health care provider about birth control (contraception). Osteoporosis and menopause  Osteoporosis is a disease in which the bones lose minerals and strength with aging. This can result in serious bone fractures. Your risk for osteoporosis can be identified using a bone density scan.  If you are 4 years of age or older, or if you are at risk for osteoporosis and fractures, ask your health care provider if you should be screened.  Ask your health care provider whether you should take a calcium or vitamin D supplement to lower your risk for osteoporosis.  Menopause may have certain physical symptoms and risks.  Hormone replacement therapy may reduce some of these symptoms and risks. Talk to your health care provider about whether hormone replacement therapy is right for you. Follow these instructions at home:  Schedule regular health, dental, and eye exams.  Stay current with your immunizations.  Do not use any tobacco products including cigarettes, chewing tobacco, or electronic cigarettes.  If you are pregnant, do not drink alcohol.  If you are breastfeeding, limit how much and how often you drink alcohol.  Limit alcohol intake to no more than 1 drink per day for nonpregnant women. One drink equals 12 ounces of beer, 5 ounces of wine, or 1 ounces of hard liquor.  Do not use street drugs.  Do not share needles.  Ask your health care provider for help if you need support  or information about quitting drugs.  Tell your health care provider if you often feel depressed.  Tell your health care provider if you have ever been abused or do not feel safe at home. This information is not intended to replace advice given to you by your health care provider. Make sure you discuss any questions you have with your health care provider. Document Released: 06/03/2011 Document Revised: 04/25/2016 Document Reviewed: 08/22/2015 Elsevier Interactive Patient Education  2017 [employer].

## 2017-03-09 NOTE — Telephone Encounter (Signed)
Please call in alprazolam.  

## 2017-03-10 DIAGNOSIS — A749 Chlamydial infection, unspecified: Secondary | ICD-10-CM | POA: Insufficient documentation

## 2017-03-10 DIAGNOSIS — N760 Acute vaginitis: Secondary | ICD-10-CM

## 2017-03-10 DIAGNOSIS — B9689 Other specified bacterial agents as the cause of diseases classified elsewhere: Secondary | ICD-10-CM | POA: Insufficient documentation

## 2017-03-10 DIAGNOSIS — A549 Gonococcal infection, unspecified: Secondary | ICD-10-CM

## 2017-03-10 HISTORY — DX: Gonococcal infection, unspecified: A54.9

## 2017-03-10 HISTORY — DX: Chlamydial infection, unspecified: A74.9

## 2017-03-10 LAB — NUSWAB VAGINITIS PLUS (VG+)
Atopobium vaginae: HIGH Score — AB
BVAB 2: HIGH {score} — AB
CANDIDA ALBICANS, NAA: NEGATIVE
CHLAMYDIA TRACHOMATIS, NAA: POSITIVE — AB
Candida glabrata, NAA: NEGATIVE
Neisseria gonorrhoeae, NAA: POSITIVE — AB
Trich vag by NAA: NEGATIVE

## 2017-03-10 MED ORDER — AZITHROMYCIN 500 MG PO TABS: 1000.0000 mg | ORAL_TABLET | Freq: Once | ORAL | 0 refills | Status: AC

## 2017-03-10 MED ORDER — CEFIXIME 400 MG PO TABS: 400.0000 mg | ORAL_TABLET | Freq: Once | ORAL | 0 refills | Status: AC

## 2017-03-10 MED ORDER — METRONIDAZOLE 500 MG PO TABS: 500.0000 mg | ORAL_TABLET | Freq: Two times a day (BID) | ORAL | 0 refills | Status: DC

## 2017-03-10 NOTE — Progress Notes (Signed)
Advised  ED 

## 2017-03-10 NOTE — Telephone Encounter (Signed)
Rx called in to pharmacy. 

## 2017-03-10 NOTE — Addendum Note (Signed)
Addended by: Margaretann Loveless on: 03/10/2017 11:13 AM   Modules accepted: Orders

## 2017-03-11 ENCOUNTER — Telehealth: Payer: Self-pay

## 2017-03-11 ENCOUNTER — Telehealth: Payer: Self-pay | Admitting: Family Medicine

## 2017-03-11 MED ORDER — AZITHROMYCIN 500 MG PO TABS: 500.0000 mg | ORAL_TABLET | Freq: Once | ORAL | 0 refills | Status: AC

## 2017-03-11 MED ORDER — CEFIXIME 400 MG PO TABS: 400.0000 mg | ORAL_TABLET | Freq: Every day | ORAL | 0 refills | Status: DC

## 2017-03-11 NOTE — Telephone Encounter (Signed)
Patient requesting refill on Suprax and Azithromycin. Patient reports she took both medications yesterday at the same time and vomited pills up.  Per Boneta Lucks, send medications [facility]. Patient advised if this happens again to call and she will need to come in the office for a shot of ceftriaxone.

## 2017-03-24 ENCOUNTER — Telehealth: Payer: Self-pay | Admitting: Family Medicine

## 2017-03-24 DIAGNOSIS — N921 Excessive and frequent menstruation with irregular cycle: Secondary | ICD-10-CM

## 2017-03-24 DIAGNOSIS — Z8619 Personal history of other infectious and parasitic diseases: Secondary | ICD-10-CM

## 2017-03-24 NOTE — Telephone Encounter (Signed)
Referral placed.

## 2017-03-24 NOTE — Telephone Encounter (Signed)
Please review

## 2017-03-24 NOTE — Telephone Encounter (Signed)
Pt is requesting a referral to go to [facility] Minimal invasive surgery.  Phone # [phone] and Fax # #[phone] due to heavy bleeding.  GN#[phone]/QI

## 2017-04-07 ENCOUNTER — Other Ambulatory Visit: Payer: Self-pay | Admitting: Family Medicine

## 2017-04-07 DIAGNOSIS — G8921 Chronic pain due to trauma: Secondary | ICD-10-CM

## 2017-04-07 MED ORDER — OXYCODONE HCL 5 MG PO TABS: ORAL_TABLET | ORAL | 0 refills | Status: DC

## 2017-04-07 NOTE — Telephone Encounter (Signed)
Pt contacted office for refill request on the following medications:  oxyCODONE (OXY IR/ROXICODONE) 5 MG immediate release tablet.  CB#[phone]/MW ° °

## 2017-04-14 DIAGNOSIS — N924 Excessive bleeding in the premenopausal period: Secondary | ICD-10-CM | POA: Diagnosis not present

## 2017-05-05 ENCOUNTER — Other Ambulatory Visit: Payer: Self-pay | Admitting: Family Medicine

## 2017-05-05 DIAGNOSIS — G8921 Chronic pain due to trauma: Secondary | ICD-10-CM

## 2017-05-05 MED ORDER — OXYCODONE HCL 5 MG PO TABS: ORAL_TABLET | ORAL | 0 refills | Status: DC

## 2017-05-05 NOTE — Telephone Encounter (Signed)
Las fill was 04/07/17-aa

## 2017-05-05 NOTE — Telephone Encounter (Signed)
Pt contacted office for refill request on the following medications:  oxyCODONE (OXY IR/ROXICODONE) 5 MG immediate release tablet.  CB#[phone]/MW ° °

## 2017-05-21 ENCOUNTER — Other Ambulatory Visit: Payer: Self-pay | Admitting: Radiology

## 2017-05-21 DIAGNOSIS — Z803 Family history of malignant neoplasm of breast: Secondary | ICD-10-CM | POA: Diagnosis not present

## 2017-05-21 DIAGNOSIS — Z79899 Other long term (current) drug therapy: Secondary | ICD-10-CM | POA: Diagnosis not present

## 2017-05-21 DIAGNOSIS — Z87442 Personal history of urinary calculi: Secondary | ICD-10-CM | POA: Diagnosis not present

## 2017-05-21 DIAGNOSIS — Z842 Family history of other diseases of the genitourinary system: Secondary | ICD-10-CM | POA: Diagnosis not present

## 2017-05-21 DIAGNOSIS — N939 Abnormal uterine and vaginal bleeding, unspecified: Secondary | ICD-10-CM

## 2017-05-21 DIAGNOSIS — G629 Polyneuropathy, unspecified: Secondary | ICD-10-CM | POA: Diagnosis not present

## 2017-05-21 DIAGNOSIS — Z8249 Family history of ischemic heart disease and other diseases of the circulatory system: Secondary | ICD-10-CM | POA: Diagnosis not present

## 2017-05-21 DIAGNOSIS — Z833 Family history of diabetes mellitus: Secondary | ICD-10-CM | POA: Diagnosis not present

## 2017-05-21 DIAGNOSIS — Z9049 Acquired absence of other specified parts of digestive tract: Secondary | ICD-10-CM | POA: Diagnosis not present

## 2017-05-27 ENCOUNTER — Ambulatory Visit
Admission: RE | Admit: 2017-05-27 | Discharge: 2017-05-27 | Disposition: A | Discharge status: Home / Self Care | Payer: Medicare Other | Source: Ambulatory Visit | Attending: Radiology | Admitting: Radiology

## 2017-05-27 DIAGNOSIS — K5730 Diverticulosis of large intestine without perforation or abscess without bleeding: Secondary | ICD-10-CM | POA: Insufficient documentation

## 2017-05-27 DIAGNOSIS — Q512 Other doubling of uterus: Secondary | ICD-10-CM | POA: Insufficient documentation

## 2017-05-27 DIAGNOSIS — N858 Other specified noninflammatory disorders of uterus: Secondary | ICD-10-CM | POA: Diagnosis not present

## 2017-05-27 DIAGNOSIS — N939 Abnormal uterine and vaginal bleeding, unspecified: Secondary | ICD-10-CM

## 2017-05-27 MED ORDER — GADOBENATE DIMEGLUMINE 529 MG/ML IV SOLN
20.0000 mL | Freq: Once | INTRAVENOUS | Status: AC | PRN
  Administered 2017-05-27: 20 mL via INTRAVENOUS

## 2017-06-02 ENCOUNTER — Other Ambulatory Visit: Payer: Self-pay | Admitting: Family Medicine

## 2017-06-02 DIAGNOSIS — G8921 Chronic pain due to trauma: Secondary | ICD-10-CM

## 2017-06-02 MED ORDER — OXYCODONE HCL 5 MG PO TABS: ORAL_TABLET | ORAL | 0 refills | Status: DC

## 2017-06-02 NOTE — Telephone Encounter (Signed)
Pt contacted office for refill request on the following medications:  oxyCODONE (OXY IR/ROXICODONE) 5 MG immediate release tablet.  CB#[phone]/MW ° °

## 2017-06-05 HISTORY — PX: UTERINE ARTERY EMBOLIZATION: SHX2629

## 2017-06-06 ENCOUNTER — Encounter: Payer: Self-pay | Admitting: Family Medicine

## 2017-06-10 ENCOUNTER — Encounter: Payer: Self-pay | Admitting: Family Medicine

## 2017-06-22 ENCOUNTER — Other Ambulatory Visit: Payer: Self-pay

## 2017-06-22 ENCOUNTER — Encounter: Payer: Self-pay | Admitting: Emergency Medicine

## 2017-06-22 ENCOUNTER — Emergency Department: Payer: Medicare Other

## 2017-06-22 DIAGNOSIS — K222 Esophageal obstruction: Secondary | ICD-10-CM | POA: Diagnosis not present

## 2017-06-22 DIAGNOSIS — K219 Gastro-esophageal reflux disease without esophagitis: Secondary | ICD-10-CM | POA: Insufficient documentation

## 2017-06-22 DIAGNOSIS — R109 Unspecified abdominal pain: Secondary | ICD-10-CM | POA: Diagnosis not present

## 2017-06-22 DIAGNOSIS — Z79899 Other long term (current) drug therapy: Secondary | ICD-10-CM | POA: Insufficient documentation

## 2017-06-22 DIAGNOSIS — Q393 Congenital stenosis and stricture of esophagus: Secondary | ICD-10-CM | POA: Diagnosis not present

## 2017-06-22 DIAGNOSIS — R05 Cough: Secondary | ICD-10-CM | POA: Diagnosis not present

## 2017-06-22 DIAGNOSIS — J40 Bronchitis, not specified as acute or chronic: Secondary | ICD-10-CM | POA: Diagnosis not present

## 2017-06-22 LAB — COMPREHENSIVE METABOLIC PANEL
ALK PHOS: 108 U/L (ref 38–126)
ALT: 31 U/L (ref 14–54)
AST: 35 U/L (ref 15–41)
Albumin: 3.7 g/dL (ref 3.5–5.0)
Anion gap: 11 (ref 5–15)
BUN: 7 mg/dL (ref 6–20)
CALCIUM: 9.2 mg/dL (ref 8.9–10.3)
CHLORIDE: 101 mmol/L (ref 101–111)
CO2: 26 mmol/L (ref 22–32)
CREATININE: 0.59 mg/dL (ref 0.44–1.00)
Glucose, Bld: 215 mg/dL — ABNORMAL HIGH (ref 65–99)
Potassium: 3.4 mmol/L — ABNORMAL LOW (ref 3.5–5.1)
Sodium: 138 mmol/L (ref 135–145)
Total Bilirubin: 0.7 mg/dL (ref 0.3–1.2)
Total Protein: 7.5 g/dL (ref 6.5–8.1)

## 2017-06-22 LAB — CBC
HCT: 38 % (ref 35.0–47.0)
Hemoglobin: 13.5 g/dL (ref 12.0–16.0)
MCH: 30 pg (ref 26.0–34.0)
MCHC: 35.4 g/dL (ref 32.0–36.0)
MCV: 84.7 fL (ref 80.0–100.0)
PLATELETS: 279 10*3/uL (ref 150–440)
RBC: 4.49 MIL/uL (ref 3.80–5.20)
RDW: 12.7 % (ref 11.5–14.5)
WBC: 7.8 10*3/uL (ref 3.6–11.0)

## 2017-06-22 LAB — TROPONIN I

## 2017-06-22 LAB — LIPASE, BLOOD: LIPASE: 24 U/L (ref 11–51)

## 2017-06-22 NOTE — ED Triage Notes (Signed)
Patient states that she has had a cough times one week. Patient states that 2 days ago she started having upper abdominal pain. Patient states that since 9:00 this morning she felt like has a food bolus. Patient states that every time she eats that the food comes back up.

## 2017-06-23 ENCOUNTER — Emergency Department
Admission: EM | Admit: 2017-06-23 | Discharge: 2017-06-23 | Disposition: A | Discharge status: Home / Self Care | Payer: Medicare Other | Attending: Emergency Medicine | Admitting: Emergency Medicine

## 2017-06-23 DIAGNOSIS — K222 Esophageal obstruction: Secondary | ICD-10-CM

## 2017-06-23 DIAGNOSIS — J40 Bronchitis, not specified as acute or chronic: Secondary | ICD-10-CM

## 2017-06-23 DIAGNOSIS — K219 Gastro-esophageal reflux disease without esophagitis: Secondary | ICD-10-CM

## 2017-06-23 HISTORY — DX: Accidental discharge from unspecified firearms or gun, initial encounter: W34.00XA

## 2017-06-23 MED ORDER — ALBUTEROL SULFATE HFA 108 (90 BASE) MCG/ACT IN AERS: 2.0000 | INHALATION_SPRAY | RESPIRATORY_TRACT | 0 refills | Status: DC | PRN

## 2017-06-23 MED ORDER — HYDROCOD POLST-CPM POLST ER 10-8 MG/5ML PO SUER: 5.0000 mL | Freq: Two times a day (BID) | ORAL | 0 refills | Status: DC

## 2017-06-23 MED ORDER — HYDROCOD POLST-CPM POLST ER 10-8 MG/5ML PO SUER
5.0000 mL | Freq: Once | ORAL | Status: AC
  Administered 2017-06-23: 5 mL via ORAL
  Filled 2017-06-23: qty 5

## 2017-06-23 MED ORDER — IPRATROPIUM-ALBUTEROL 0.5-2.5 (3) MG/3ML IN SOLN
3.0000 mL | Freq: Once | RESPIRATORY_TRACT | Status: AC
Start: 2017-06-23 — End: 2017-06-23
  Administered 2017-06-23: 3 mL via RESPIRATORY_TRACT
  Filled 2017-06-23: qty 3

## 2017-06-23 MED ORDER — FAMOTIDINE 20 MG PO TABS: 20.0000 mg | ORAL_TABLET | Freq: Two times a day (BID) | ORAL | 0 refills | Status: DC

## 2017-06-23 NOTE — Discharge Instructions (Signed)
1. Start Pepcid twice daily for reflux. 2. Use albuterol inhaler 2 puffs every 4 hours as needed for cough/difficulty breathing. 3. You may take Tussionex as needed for cough. 4. Eat blended foods or liquids until seen by the GI doctor. 5. Return to the ER for worsening symptoms, persistent vomiting, difficulty breathing or other concerns.

## 2017-06-23 NOTE — ED Notes (Signed)
Patient to stat desk asking about wait time. Patient updated on wait time.  
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
## 2017-07-10 ENCOUNTER — Other Ambulatory Visit: Payer: Self-pay | Admitting: Emergency Medicine

## 2017-07-10 DIAGNOSIS — G8921 Chronic pain due to trauma: Secondary | ICD-10-CM

## 2017-07-10 MED ORDER — OXYCODONE HCL 5 MG PO TABS
ORAL_TABLET | ORAL | 0 refills | Status: DC
Start: 2017-07-10 — End: 2017-08-05

## 2017-08-05 ENCOUNTER — Other Ambulatory Visit: Payer: Self-pay | Admitting: Family Medicine

## 2017-08-05 DIAGNOSIS — G8921 Chronic pain due to trauma: Secondary | ICD-10-CM

## 2017-08-05 MED ORDER — OXYCODONE HCL 5 MG PO TABS: ORAL_TABLET | ORAL | 0 refills | Status: DC

## 2017-08-05 NOTE — Telephone Encounter (Signed)
Pt contacted office for refill request on the following medications:  oxyCODONE (OXY IR/ROXICODONE) 5 MG immediate release tablet  Last Rx: 07/10/17 LOV: 03/06/17 with Jenni Pt doesn't have a F/U scheduled at this time. Please advise. Thanks TNP

## 2017-08-26 ENCOUNTER — Telehealth: Payer: Self-pay | Admitting: Family Medicine

## 2017-08-26 MED ORDER — SCOPOLAMINE 1 MG/3DAYS TD PT72: 1.0000 | MEDICATED_PATCH | TRANSDERMAL | 0 refills | Status: AC

## 2017-08-26 NOTE — Telephone Encounter (Signed)
Patient stated yes med request is for motion sickness. She will be on cruise for 11 days.

## 2017-08-26 NOTE — Telephone Encounter (Signed)
Please clarify, does she want something for motion sickness? How long will she be gone.

## 2017-08-26 NOTE — Telephone Encounter (Signed)
Please advise 

## 2017-08-26 NOTE — Telephone Encounter (Signed)
Pt would like to get something for nausea for a cruise that she is going to on Tuesday.  She uses [facility] in [place]  Pt's call [phone]  Thanks Barth Kirks

## 2017-08-29 ENCOUNTER — Other Ambulatory Visit: Payer: Self-pay | Admitting: Family Medicine

## 2017-08-29 DIAGNOSIS — G8921 Chronic pain due to trauma: Secondary | ICD-10-CM

## 2017-08-29 NOTE — Telephone Encounter (Signed)
Pt contacted office for refill request on the following medications:  oxyCODONE (OXY IR/ROXICODONE) 5 MG immediate release tablet.  CB#[phone]/MW ° °

## 2017-08-29 NOTE — Telephone Encounter (Signed)
Last RF 08/05/17 and last OV 03/06/17 for CPE

## 2017-09-01 ENCOUNTER — Other Ambulatory Visit: Payer: Self-pay | Admitting: Family Medicine

## 2017-09-01 MED ORDER — OXYCODONE HCL 5 MG PO TABS: ORAL_TABLET | ORAL | 0 refills | Status: DC

## 2017-09-01 NOTE — Telephone Encounter (Signed)
Please call in alprazolam.  

## 2017-09-02 NOTE — Telephone Encounter (Signed)
Rx called in to pharmacy. 

## 2017-10-02 ENCOUNTER — Other Ambulatory Visit: Payer: Self-pay

## 2017-10-02 DIAGNOSIS — G8921 Chronic pain due to trauma: Secondary | ICD-10-CM

## 2017-10-02 MED ORDER — OXYCODONE HCL 5 MG PO TABS: ORAL_TABLET | ORAL | 0 refills | Status: DC

## 2017-10-02 NOTE — Telephone Encounter (Signed)
Patient called requesting refill on Oxycodone and last fill was on 09/01/17-,Anastasiya V Hopkins, RMA

## 2017-10-06 ENCOUNTER — Other Ambulatory Visit: Payer: Self-pay | Admitting: Family Medicine

## 2017-10-06 NOTE — Telephone Encounter (Signed)
Please call in alprazolam.  

## 2017-10-07 NOTE — Telephone Encounter (Signed)
Called in Rx as below.  

## 2017-11-03 ENCOUNTER — Other Ambulatory Visit: Payer: Self-pay | Admitting: Family Medicine

## 2017-11-03 DIAGNOSIS — G8921 Chronic pain due to trauma: Secondary | ICD-10-CM

## 2017-11-03 NOTE — Telephone Encounter (Signed)
Pt contacted office for refill request on the following medications:  oxyCODONE (OXY IR/ROXICODONE) 5 MG immediate release tablet   [facility] [address]  [phone]#[phone]/MW

## 2017-11-04 MED ORDER — OXYCODONE HCL 5 MG PO TABS: ORAL_TABLET | ORAL | 0 refills | Status: DC

## 2017-11-04 NOTE — Telephone Encounter (Signed)
Please advise patient that oxycodone/apap  prescription has been sent electronically to   [facility] - [place], [place] - [address] AT [address]EC OF [address] [place] [address] Phone: [phone] Fax: [phone]   

## 2017-11-21 ENCOUNTER — Encounter: Payer: Self-pay | Admitting: Family Medicine

## 2017-11-21 ENCOUNTER — Ambulatory Visit (INDEPENDENT_AMBULATORY_CARE_PROVIDER_SITE_OTHER): Payer: Medicare Other | Admitting: Family Medicine

## 2017-11-21 VITALS — BP 138/82 | HR 98 | Temp 98.2°F | Wt 194.0 lb

## 2017-11-21 DIAGNOSIS — E119 Type 2 diabetes mellitus without complications: Secondary | ICD-10-CM | POA: Insufficient documentation

## 2017-11-21 DIAGNOSIS — Z114 Encounter for screening for human immunodeficiency virus [HIV]: Secondary | ICD-10-CM

## 2017-11-21 DIAGNOSIS — G909 Disorder of the autonomic nervous system, unspecified: Secondary | ICD-10-CM

## 2017-11-21 DIAGNOSIS — R300 Dysuria: Secondary | ICD-10-CM

## 2017-11-21 LAB — POCT URINALYSIS DIPSTICK
BILIRUBIN UA: NEGATIVE
KETONES UA: NEGATIVE
Leukocytes, UA: NEGATIVE
Nitrite, UA: NEGATIVE
PH UA: 6 (ref 5.0–8.0)
Protein, UA: NEGATIVE
RBC UA: NEGATIVE
SPEC GRAV UA: 1.015 (ref 1.010–1.025)
UROBILINOGEN UA: 0.2 U/dL

## 2017-11-21 LAB — POCT UA - MICROALBUMIN: MICROALBUMIN (UR) POC: 20 mg/L

## 2017-11-21 LAB — POCT GLYCOSYLATED HEMOGLOBIN (HGB A1C): Hemoglobin A1C: 10.8

## 2017-11-21 MED ORDER — METFORMIN HCL 500 MG PO TABS: 500.0000 mg | ORAL_TABLET | Freq: Two times a day (BID) | ORAL | 3 refills | Status: DC

## 2017-11-21 MED ORDER — BLOOD GLUCOSE MONITOR KIT: PACK | 0 refills | Status: DC

## 2017-11-21 NOTE — Patient Instructions (Signed)
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
## 2017-11-22 LAB — C. TRACHOMATIS/N. GONORRHOEAE RNA
C. trachomatis RNA, TMA: NOT DETECTED
N. gonorrhoeae RNA, TMA: NOT DETECTED

## 2017-11-23 LAB — URINE CULTURE
MICRO NUMBER: 81441531
SPECIMEN QUALITY: ADEQUATE

## 2017-11-24 ENCOUNTER — Telehealth: Payer: Self-pay

## 2017-11-24 MED ORDER — AMOXICILLIN-POT CLAVULANATE 875-125 MG PO TABS: 1.0000 | ORAL_TABLET | Freq: Two times a day (BID) | ORAL | 0 refills | Status: DC

## 2017-11-24 MED ORDER — FLUCONAZOLE 150 MG PO TABS: 150.0000 mg | ORAL_TABLET | Freq: Once | ORAL | 0 refills | Status: DC

## 2017-11-24 NOTE — Telephone Encounter (Signed)
Both RXs sent in-Lyndal Alamillo Ander PurpuraV Georgeanna Radziewicz, RMA

## 2017-11-24 NOTE — Telephone Encounter (Signed)
Diflucan 150 mg 1 stat by mouth if yeast develops #1.

## 2017-11-24 NOTE — Telephone Encounter (Signed)
Patient advised and she states she gets yeast infection from antibiotics can she have something for this? Please advise and then will [facility]May[facility] Bare [facility] Ermal Brzozowski, RMA

## 2017-11-24 NOTE — Telephone Encounter (Signed)
-----   Message from Tamsen Roersennis E Chrismon, [place] sent at 11/24/2017  8:29 AM EST ----- A strep bacteria found in urine culture. Need Amoxicillin 875 mg BID #20 and increase water intake to flush kidneys. Urine test for STD's was negative. Awaiting final blood test results.

## 2017-12-03 ENCOUNTER — Other Ambulatory Visit: Payer: Self-pay | Admitting: Family Medicine

## 2017-12-03 DIAGNOSIS — G8921 Chronic pain due to trauma: Secondary | ICD-10-CM

## 2017-12-03 MED ORDER — OXYCODONE HCL 5 MG PO TABS: ORAL_TABLET | ORAL | 0 refills | Status: DC

## 2017-12-03 NOTE — Telephone Encounter (Signed)
Please review. Thanks!  

## 2017-12-03 NOTE — Telephone Encounter (Signed)
Needs refill on Oxycodone

## 2017-12-04 ENCOUNTER — Other Ambulatory Visit: Payer: Self-pay | Admitting: Family Medicine

## 2017-12-04 DIAGNOSIS — E119 Type 2 diabetes mellitus without complications: Secondary | ICD-10-CM | POA: Diagnosis not present

## 2017-12-05 ENCOUNTER — Encounter: Payer: Self-pay | Admitting: Family Medicine

## 2017-12-05 ENCOUNTER — Ambulatory Visit (INDEPENDENT_AMBULATORY_CARE_PROVIDER_SITE_OTHER): Payer: Medicare Other | Admitting: Family Medicine

## 2017-12-05 VITALS — BP 128/84 | HR 97 | Temp 98.6°F | Resp 18 | Wt 192.8 lb

## 2017-12-05 DIAGNOSIS — E119 Type 2 diabetes mellitus without complications: Secondary | ICD-10-CM

## 2017-12-05 LAB — CBC WITH DIFFERENTIAL/PLATELET
Basophils Absolute: 0 10*3/uL (ref 0.0–0.2)
Basos: 0 %
EOS (ABSOLUTE): 0.1 10*3/uL (ref 0.0–0.4)
Eos: 1 %
Hematocrit: 40.8 % (ref 34.0–46.6)
Hemoglobin: 13.7 g/dL (ref 11.1–15.9)
IMMATURE GRANULOCYTES: 1 %
Immature Grans (Abs): 0 10*3/uL (ref 0.0–0.1)
Lymphocytes Absolute: 2 10*3/uL (ref 0.7–3.1)
Lymphs: 26 %
MCH: 29.9 pg (ref 26.6–33.0)
MCHC: 33.6 g/dL (ref 31.5–35.7)
MCV: 89 fL (ref 79–97)
MONOS ABS: 0.3 10*3/uL (ref 0.1–0.9)
Monocytes: 4 %
NEUTROS PCT: 68 %
Neutrophils Absolute: 5.1 10*3/uL (ref 1.4–7.0)
PLATELETS: 322 10*3/uL (ref 150–379)
RBC: 4.58 x10E6/uL (ref 3.77–5.28)
RDW: 13.1 % (ref 12.3–15.4)
WBC: 7.5 10*3/uL (ref 3.4–10.8)

## 2017-12-05 LAB — COMPREHENSIVE METABOLIC PANEL
A/G RATIO: 1.6 (ref 1.2–2.2)
ALT: 52 IU/L — AB (ref 0–32)
AST: 38 IU/L (ref 0–40)
Albumin: 4.2 g/dL (ref 3.5–5.5)
Alkaline Phosphatase: 138 IU/L — ABNORMAL HIGH (ref 39–117)
BUN/Creatinine Ratio: 11 (ref 9–23)
BUN: 6 mg/dL (ref 6–24)
Bilirubin Total: 0.5 mg/dL (ref 0.0–1.2)
CALCIUM: 9.2 mg/dL (ref 8.7–10.2)
CHLORIDE: 104 mmol/L (ref 96–106)
CO2: 18 mmol/L — AB (ref 20–29)
Creatinine, Ser: 0.56 mg/dL — ABNORMAL LOW (ref 0.57–1.00)
GFR calc Af Amer: 131 mL/min/{1.73_m2} (ref >59)
GFR, EST NON AFRICAN AMERICAN: 114 mL/min/{1.73_m2} (ref >59)
GLUCOSE: 163 mg/dL — AB (ref 65–99)
Globulin, Total: 2.6 g/dL (ref 1.5–4.5)
POTASSIUM: 4.1 mmol/L (ref 3.5–5.2)
Sodium: 140 mmol/L (ref 134–144)
Total Protein: 6.8 g/dL (ref 6.0–8.5)

## 2017-12-05 LAB — HIV ANTIBODY (ROUTINE TESTING W REFLEX): HIV Screen 4th Generation wRfx: NONREACTIVE
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
## 2017-12-08 ENCOUNTER — Telehealth: Payer: Self-pay | Admitting: Family Medicine

## 2017-12-08 NOTE — Telephone Encounter (Signed)
No switch that I was aware of - just thought I saw her because I was available at the time.

## 2017-12-08 NOTE — Telephone Encounter (Signed)
Patient would like to know if she could get her hormone levels checked.  Please advise.

## 2017-12-08 NOTE — Telephone Encounter (Signed)
Maurine MinisterDennis, you last saw this patient recently on 12/05/2017.Did she switch PCP's. Patient wants to know if she can have her hormone levels checked.

## 2017-12-09 NOTE — Telephone Encounter (Signed)
LMOVM to return call.

## 2017-12-09 NOTE — Telephone Encounter (Signed)
I would need to see her for o.v. For whatever problems she thinks are related to hormones.

## 2017-12-10 NOTE — Telephone Encounter (Signed)
Patient stated that she will discuss getting hormone levels checked when she sees Maurine MinisterDennis in March.

## 2017-12-13 ENCOUNTER — Other Ambulatory Visit: Payer: Self-pay | Admitting: Family Medicine

## 2018-01-05 ENCOUNTER — Other Ambulatory Visit: Payer: Self-pay

## 2018-01-05 DIAGNOSIS — G8921 Chronic pain due to trauma: Secondary | ICD-10-CM

## 2018-01-05 NOTE — Telephone Encounter (Signed)
Patient is requesting a refill on oxyCODONE (OXY IR/ROXICODONE) 5 MG immediate release tablet be sent to [facility] pharmacy.  

## 2018-01-06 ENCOUNTER — Other Ambulatory Visit: Payer: Self-pay | Admitting: Family Medicine

## 2018-01-06 MED ORDER — OXYCODONE HCL 5 MG PO TABS: ORAL_TABLET | ORAL | 0 refills | Status: DC

## 2018-01-06 NOTE — Telephone Encounter (Signed)
Pharmacy requesting refills. Thanks!  

## 2018-02-02 ENCOUNTER — Other Ambulatory Visit: Payer: Self-pay | Admitting: Family Medicine

## 2018-02-02 DIAGNOSIS — G8921 Chronic pain due to trauma: Secondary | ICD-10-CM

## 2018-02-02 MED ORDER — OXYCODONE HCL 5 MG PO TABS: ORAL_TABLET | ORAL | 0 refills | Status: DC

## 2018-02-02 NOTE — Telephone Encounter (Signed)
Pt contacted office for refill request on the following medications:  oxyCODONE (OXY IR/ROXICODONE) 5 MG immediate release tablet  [facility] [address]/[place]  Last Rx: 01/06/18 LOV: 12/05/17 Maurine Minister(Dennis)  Please advise. Thanks TNP

## 2018-02-12 ENCOUNTER — Telehealth: Payer: Self-pay | Admitting: Family Medicine

## 2018-02-12 ENCOUNTER — Other Ambulatory Visit: Payer: Self-pay | Admitting: Family Medicine

## 2018-02-12 MED ORDER — FLUCONAZOLE 150 MG PO TABS: 150.0000 mg | ORAL_TABLET | Freq: Once | ORAL | 0 refills | Status: DC

## 2018-02-12 NOTE — Telephone Encounter (Signed)
Please advise 

## 2018-02-12 NOTE — Telephone Encounter (Signed)
[facility] [address] Keep follow up appointment.

## 2018-02-12 NOTE — Telephone Encounter (Signed)
Pt contacted office for refill request on the following medications:  fluconazole (DIFLUCAN) 150 MG tablet   [facility] [address]  Pt stated that she gets yeast infection because of her diabetes and nothing OTC helps. Pt stated that she has OV scheduled for 02/26/18 and she can't come in before that date and she doesn't want to suffer until the appt. Please advise. Thanks TNP

## 2018-02-13 NOTE — Telephone Encounter (Signed)
LMOVM for pt to return call 

## 2018-02-13 NOTE — Progress Notes (Signed)
Patient advised.

## 2018-02-17 NOTE — Telephone Encounter (Signed)
Left detailed message advising pt rx was sent to pharmacy.

## 2018-02-26 ENCOUNTER — Encounter: Payer: Self-pay | Admitting: Family Medicine

## 2018-02-26 ENCOUNTER — Ambulatory Visit (INDEPENDENT_AMBULATORY_CARE_PROVIDER_SITE_OTHER): Payer: Medicare Other | Admitting: Family Medicine

## 2018-02-26 ENCOUNTER — Ambulatory Visit: Payer: Medicare Other | Admitting: Family Medicine

## 2018-02-26 VITALS — BP 170/98 | HR 100 | Temp 98.3°F | Wt 199.6 lb

## 2018-02-26 DIAGNOSIS — G909 Disorder of the autonomic nervous system, unspecified: Secondary | ICD-10-CM | POA: Diagnosis not present

## 2018-02-26 DIAGNOSIS — E119 Type 2 diabetes mellitus without complications: Secondary | ICD-10-CM | POA: Diagnosis not present

## 2018-02-26 DIAGNOSIS — E782 Mixed hyperlipidemia: Secondary | ICD-10-CM

## 2018-02-26 DIAGNOSIS — Z113 Encounter for screening for infections with a predominantly sexual mode of transmission: Secondary | ICD-10-CM

## 2018-02-26 LAB — POCT GLYCOSYLATED HEMOGLOBIN (HGB A1C): Hemoglobin A1C: 6.5

## 2018-02-26 NOTE — Progress Notes (Deleted)
Patient: Alison Landry Female    DOB: [birth year]   47 y.o.   MRN: [id number] Visit Date: 02/26/2018  Today's Provider: Vernie Murders, PA   No chief complaint on file.  Subjective:    HPI  Diabetes Mellitus Type II, Follow-up:   RecentLabs       Lab Results  Component Value Date   HGBA1C 10.8 11/21/2017   HGBA1C 5.9 (H) 02/12/2016   HGBA1C 5.5 06/17/2014      Last seen for diabetes 3 months ago  Management since then includes continue Metformin 500 mg BID and advised to check FBS. She reports good compliance with treatment. She is not having side effects.  Current symptoms include visual disturbances Home blood sugar records: highest was 442 and lowest was 190  Episodes of hypoglycemia? no              Current Insulin Regimen: none Most Recent Eye Exam: recently diagnosed on 11/21/17 Weight trend: stable Prior visit with dietician:patient declined referral on 12/05/17 Current diet: in general, an "unhealthy" diet Current exercise: none  Pertinent Labs: RecentLabs   Lab Results  Component Value Date   CHOL 218 (H) 02/12/2016   HDL 38 (L) 02/12/2016   LDLCALC 120 (H) 02/12/2016   TRIG 302 (H) 02/12/2016   CHOLHDL 5.7 (H) 02/12/2016        Wt Readings from Last 3 Encounters:  11/21/17 194 lb (88 kg)  06/22/17 210 lb (95.3 kg)  03/06/17 217 lb (98.4 kg)                 Allergies  Allergen Reactions  . Nubain  [Nalbuphine]      Current Outpatient Medications:  .  ALPRAZolam (XANAX) 0.5 MG tablet, TAKE 1/2 TO 1 TABLET BY MOUTH EVERY DAY AS NEEDED, Disp: 30 tablet, Rfl: 5 .  blood glucose meter kit and supplies KIT, Dispense based on patient and insurance preference. Use up to four times daily as directed. (FOR ICD-9 250.00, 250.01)., Disp: 1 each, Rfl: 0 .  gabapentin (NEURONTIN) 600 MG tablet, TAKE 1 TABLET BY MOUTH THREE TIMES DAILY, Disp: 90 tablet, Rfl: 12 .  glucose blood (ONE TOUCH ULTRA TEST) test strip, Check  fasting blood sugar once a day., Disp: 100 each, Rfl: 4 .  metFORMIN (GLUCOPHAGE) 500 MG tablet, Take 1 tablet (500 mg total) by mouth 2 (two) times daily with a meal., Disp: 180 tablet, Rfl: 3 .  metroNIDAZOLE (METROGEL) 0.75 % vaginal gel, INSERT ONE APPLICATORFUL VAGINALLY AT BEDTIME, Disp: 70 g, Rfl: 3 .  Norgestimate-Ethinyl Estradiol Triphasic (ORTHO TRI-CYCLEN LO) 0.18/0.215/0.25 MG-25 MCG tab, Take 1 tablet by mouth daily., Disp: 1 Package, Rfl: 11 .  ONETOUCH DELICA LANCETS 74J MISC, Check fasting blood sugar once a day., Disp: 100 each, Rfl: 4 .  oxyCODONE (OXY IR/ROXICODONE) 5 MG immediate release tablet, Take 1 and 1/2 three times a day., Disp: 135 tablet, Rfl: 0 .  sertraline (ZOLOFT) 100 MG tablet, Take 1 tablet (100 mg total) by mouth daily., Disp: 30 tablet, Rfl: 0 .  tiZANidine (ZANAFLEX) 4 MG tablet, Every 4-6 hours as needed, Disp: 120 tablet, Rfl: 1 .  traMADol (ULTRAM) 50 MG tablet, TAKE 1 TABLET BY MOUTH THREE TIMES DAILY AS NEEDED, Disp: 90 tablet, Rfl: 4  Review of Systems  Social History   Tobacco Use  . Smoking status: Never Smoker  . Smokeless tobacco: Never Used  Substance Use Topics  . Alcohol use: Yes  Alcohol/week: 0.0 oz    Comment: Occasionally. Once a month.    Objective:   There were no vitals taken for this visit.   Physical Exam      Assessment & Plan:           Vernie Murders, PA  [facility]
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
## 2018-03-04 LAB — GC/CHLAMYDIA PROBE AMP
Chlamydia trachomatis, NAA: NEGATIVE
NEISSERIA GONORRHOEAE BY PCR: NEGATIVE

## 2018-03-05 ENCOUNTER — Other Ambulatory Visit: Payer: Self-pay

## 2018-03-05 DIAGNOSIS — G8921 Chronic pain due to trauma: Secondary | ICD-10-CM

## 2018-03-05 NOTE — Telephone Encounter (Signed)
Patient called requesting refill on medication. 

## 2018-03-06 MED ORDER — OXYCODONE HCL 5 MG PO TABS: ORAL_TABLET | ORAL | 0 refills | Status: DC

## 2018-03-18 ENCOUNTER — Telehealth: Payer: Self-pay | Admitting: Family Medicine

## 2018-03-18 DIAGNOSIS — L989 Disorder of the skin and subcutaneous tissue, unspecified: Secondary | ICD-10-CM

## 2018-03-18 NOTE — Telephone Encounter (Signed)
Pt stated at her OV with Maurine Ministerennis on 02/26/18 they had discussed the growth/possible mole on pt's left eye. [facility] on [address] so she can see about having it removed. Please advise. Thanks TNP

## 2018-03-19 NOTE — Telephone Encounter (Signed)
It is a growth around the eye, not on the eye ball or eye lid.

## 2018-03-19 NOTE — Telephone Encounter (Signed)
Schedule dermatology referral for lesion around eye of uncertain behavior.

## 2018-03-19 NOTE — Telephone Encounter (Signed)
If this is truly on the eye instead of the eyelid, the dermatologist may want you to be seen by an ophthalmologist.

## 2018-03-19 NOTE — Telephone Encounter (Signed)
Referral placed.

## 2018-04-25 ENCOUNTER — Other Ambulatory Visit: Payer: Self-pay | Admitting: Family Medicine

## 2018-04-28 ENCOUNTER — Other Ambulatory Visit: Payer: Self-pay | Admitting: Family Medicine

## 2018-04-28 DIAGNOSIS — G8921 Chronic pain due to trauma: Secondary | ICD-10-CM

## 2018-04-28 MED ORDER — OXYCODONE HCL 5 MG PO TABS: ORAL_TABLET | ORAL | 0 refills | Status: DC

## 2018-04-28 NOTE — Telephone Encounter (Signed)
Patient is requesting a refill on the following medication  oxyCODONE (OXY IR/ROXICODONE) 5 MG immediate release tablet  She uses [facility] on [address] and [place],

## 2018-05-20 ENCOUNTER — Ambulatory Visit (INDEPENDENT_AMBULATORY_CARE_PROVIDER_SITE_OTHER): Payer: Medicare Other

## 2018-05-20 VITALS — BP 136/70 | HR 98 | Temp 98.0°F | Ht 64.0 in | Wt 211.0 lb

## 2018-05-20 DIAGNOSIS — Z0000 Encounter for general adult medical examination without abnormal findings: Secondary | ICD-10-CM | POA: Diagnosis not present

## 2018-05-20 NOTE — Patient Instructions (Signed)
Ms. Alison Landry , Thank you for taking time to come for your Medicare Wellness Visit. I appreciate your ongoing commitment to your health goals. Please review the following plan we discussed and let me know if I can assist you in the future.   Screening recommendations/referrals: Colonoscopy: N/A Mammogram: N/A Bone Density: N/A Recommended yearly ophthalmology/optometry visit for glaucoma screening and checkup Recommended yearly dental visit for hygiene and checkup  Vaccinations: Influenza vaccine: N/A Pneumococcal vaccine: N/A Tdap vaccine: Pt declines today.  Shingles vaccine: N/A    Advanced directives: Advance directive discussed with you today. I have provided a copy for you to complete at home and have notarized. Once this is complete please bring a copy in to our office so we can scan it into your chart.  Conditions/risks identified: Obesity- recommend to start exercising 3 days a week for at least 30 minutes at a time.   Next appointment: 05/24/19 @ 2 for AWV. Pt declined setting up CPE today.   Preventive Care 40-64 Years, Female Preventive care refers to lifestyle choices and visits with your health care provider that can promote health and wellness. What does preventive care include?  A yearly physical exam. This is also called an annual well check.  Dental exams once or twice a year.  Routine eye exams. Ask your health care provider how often you should have your eyes checked.  Personal lifestyle choices, including:  Daily care of your teeth and gums.  Regular physical activity.  Eating a healthy diet.  Avoiding tobacco and drug use.  Limiting alcohol use.  Practicing safe sex.  Taking low-dose aspirin daily starting at age 56.  Taking vitamin and mineral supplements as recommended by your health care provider. What happens during an annual well check? The services and screenings done by your health care provider during your annual well check will depend on  your age, overall health, lifestyle risk factors, and family history of disease. Counseling  Your health care provider may ask you questions about your:  Alcohol use.  Tobacco use.  Drug use.  Emotional well-being.  Home and relationship well-being.  Sexual activity.  Eating habits.  Work and work Statistician.  Method of birth control.  Menstrual cycle.  Pregnancy history. Screening  You may have the following tests or measurements:  Height, weight, and BMI.  Blood pressure.  Lipid and cholesterol levels. These may be checked every 5 years, or more frequently if you are over 83 years old.  Skin check.  Lung cancer screening. You may have this screening every year starting at age 67 if you have a 30-pack-year history of smoking and currently smoke or have quit within the past 15 years.  Fecal occult blood test (FOBT) of the stool. You may have this test every year starting at age 51.  Flexible sigmoidoscopy or colonoscopy. You may have a sigmoidoscopy every 5 years or a colonoscopy every 10 years starting at age 50.  Hepatitis C blood test.  Hepatitis B blood test.  Sexually transmitted disease (STD) testing.  Diabetes screening. This is done by checking your blood sugar (glucose) after you have not eaten for a while (fasting). You may have this done every 1-3 years.  Mammogram. This may be done every 1-2 years. Talk to your health care provider about when you should start having regular mammograms. This may depend on whether you have a family history of breast cancer.  BRCA-related cancer screening. This may be done if you have a family history of  breast, ovarian, tubal, or peritoneal cancers.  Pelvic exam and Pap test. This may be done every 3 years starting at age 39. Starting at age 15, this may be done every 5 years if you have a Pap test in combination with an HPV test.  Bone density scan. This is done to screen for osteoporosis. You may have this scan if  you are at high risk for osteoporosis. Discuss your test results, treatment options, and if necessary, the need for more tests with your health care provider. Vaccines  Your health care provider may recommend certain vaccines, such as:  Influenza vaccine. This is recommended every year.  Tetanus, diphtheria, and acellular pertussis (Tdap, Td) vaccine. You may need a Td booster every 10 years.  Zoster vaccine. You may need this after age 72.  Pneumococcal 13-valent conjugate (PCV13) vaccine. You may need this if you have certain conditions and were not previously vaccinated.  Pneumococcal polysaccharide (PPSV23) vaccine. You may need one or two doses if you smoke cigarettes or if you have certain conditions. Talk to your health care provider about which screenings and vaccines you need and how often you need them. This information is not intended to replace advice given to you by your health care provider. Make sure you discuss any questions you have with your health care provider. Document Released: 12/15/2015 Document Revised: 08/07/2016 Document Reviewed: 09/19/2015 Elsevier Interactive Patient Education  2017 [place] Prevention in the Home Falls can cause injuries. They can happen to people of all ages. There are many things you can do to make your home safe and to help prevent falls. What can I do on the outside of my home?  Regularly fix the edges of walkways and driveways and fix any cracks.  Remove anything that might make you trip as you walk through a door, such as a raised step or threshold.  Trim any bushes or trees on the path to your home.  Use bright outdoor lighting.  Clear any walking paths of anything that might make someone trip, such as rocks or tools.  Regularly check to see if handrails are loose or broken. Make sure that both sides of any steps have handrails.  Any raised decks and porches should have guardrails on the edges.  Have any  leaves, snow, or ice cleared regularly.  Use sand or salt on walking paths during winter.  Clean up any spills in your garage right away. This includes oil or grease spills. What can I do in the bathroom?  Use night lights.  Install grab bars by the toilet and in the tub and shower. Do not use towel bars as grab bars.  Use non-skid mats or decals in the tub or shower.  If you need to sit down in the shower, use a plastic, non-slip stool.  Keep the floor dry. Clean up any water that spills on the floor as soon as it happens.  Remove soap buildup in the tub or shower regularly.  Attach bath mats securely with double-sided non-slip rug tape.  Do not have throw rugs and other things on the floor that can make you trip. What can I do in the bedroom?  Use night lights.  Make sure that you have a light by your bed that is easy to reach.  Do not use any sheets or blankets that are too big for your bed. They should not hang down onto the floor.  Have a firm chair that has  side arms. You can use this for support while you get dressed.  Do not have throw rugs and other things on the floor that can make you trip. What can I do in the kitchen?  Clean up any spills right away.  Avoid walking on wet floors.  Keep items that you use a lot in easy-to-reach places.  If you need to reach something above you, use a strong step stool that has a grab bar.  Keep electrical cords out of the way.  Do not use floor polish or wax that makes floors slippery. If you must use wax, use non-skid floor wax.  Do not have throw rugs and other things on the floor that can make you trip. What can I do with my stairs?  Do not leave any items on the stairs.  Make sure that there are handrails on both sides of the stairs and use them. Fix handrails that are broken or loose. Make sure that handrails are as long as the stairways.  Check any carpeting to make sure that it is firmly attached to the stairs.  Fix any carpet that is loose or worn.  Avoid having throw rugs at the top or bottom of the stairs. If you do have throw rugs, attach them to the floor with carpet tape.  Make sure that you have a light switch at the top of the stairs and the bottom of the stairs. If you do not have them, ask someone to add them for you. What else can I do to help prevent falls?  Wear shoes that:  Do not have high heels.  Have rubber bottoms.  Are comfortable and fit you well.  Are closed at the toe. Do not wear sandals.  If you use a stepladder:  Make sure that it is fully opened. Do not climb a closed stepladder.  Make sure that both sides of the stepladder are locked into place.  Ask someone to hold it for you, if possible.  Clearly mark and make sure that you can see:  Any grab bars or handrails.  First and last steps.  Where the edge of each step is.  Use tools that help you move around (mobility aids) if they are needed. These include:  Canes.  Walkers.  Scooters.  Crutches.  Turn on the lights when you go into a dark area. Replace any light bulbs as soon as they burn out.  Set up your furniture so you have a clear path. Avoid moving your furniture around.  If any of your floors are uneven, fix them.  If there are any pets around you, be aware of where they are.  Review your medicines with your doctor. Some medicines can make you feel dizzy. This can increase your chance of falling. Ask your doctor what other things that you can do to help prevent falls. This information is not intended to replace advice given to you by your health care provider. Make sure you discuss any questions you have with your health care provider. Document Released: 09/14/2009 Document Revised: 04/25/2016 Document Reviewed: 12/23/2014 Elsevier Interactive Patient Education  2017 [employer].
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
## 2018-05-21 ENCOUNTER — Other Ambulatory Visit: Payer: Self-pay

## 2018-05-21 DIAGNOSIS — G8921 Chronic pain due to trauma: Secondary | ICD-10-CM

## 2018-05-21 NOTE — Telephone Encounter (Signed)
Patient request refill

## 2018-05-22 NOTE — Telephone Encounter (Signed)
i've not seen patient since 2017. Needs to schedule follow up before refill can be approved.

## 2018-05-26 ENCOUNTER — Encounter: Payer: Self-pay | Admitting: Family Medicine

## 2018-05-26 ENCOUNTER — Ambulatory Visit: Payer: Medicare Other | Admitting: Family Medicine

## 2018-05-26 VITALS — BP 132/62 | HR 112 | Temp 99.2°F | Wt 211.4 lb

## 2018-05-26 DIAGNOSIS — E781 Pure hyperglyceridemia: Secondary | ICD-10-CM | POA: Diagnosis not present

## 2018-05-26 DIAGNOSIS — E119 Type 2 diabetes mellitus without complications: Secondary | ICD-10-CM

## 2018-05-26 DIAGNOSIS — G8921 Chronic pain due to trauma: Secondary | ICD-10-CM | POA: Diagnosis not present

## 2018-05-26 DIAGNOSIS — G909 Disorder of the autonomic nervous system, unspecified: Secondary | ICD-10-CM | POA: Diagnosis not present

## 2018-05-26 DIAGNOSIS — F4310 Post-traumatic stress disorder, unspecified: Secondary | ICD-10-CM | POA: Diagnosis not present

## 2018-05-26 DIAGNOSIS — N319 Neuromuscular dysfunction of bladder, unspecified: Secondary | ICD-10-CM | POA: Diagnosis not present

## 2018-05-26 MED ORDER — OXYCODONE HCL 5 MG PO TABS: ORAL_TABLET | ORAL | 0 refills | Status: DC

## 2018-05-26 MED ORDER — GABAPENTIN 600 MG PO TABS: 600.0000 mg | ORAL_TABLET | Freq: Three times a day (TID) | ORAL | 12 refills | Status: DC

## 2018-05-26 MED ORDER — TIZANIDINE HCL 4 MG PO TABS: ORAL_TABLET | ORAL | 1 refills | Status: DC

## 2018-05-26 MED ORDER — TRAMADOL HCL 50 MG PO TABS: 50.0000 mg | ORAL_TABLET | Freq: Three times a day (TID) | ORAL | 4 refills | Status: DC | PRN
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
## 2018-06-09 DIAGNOSIS — N319 Neuromuscular dysfunction of bladder, unspecified: Secondary | ICD-10-CM | POA: Diagnosis not present

## 2018-06-09 DIAGNOSIS — E119 Type 2 diabetes mellitus without complications: Secondary | ICD-10-CM | POA: Diagnosis not present

## 2018-06-09 DIAGNOSIS — G8921 Chronic pain due to trauma: Secondary | ICD-10-CM | POA: Diagnosis not present

## 2018-06-09 DIAGNOSIS — G909 Disorder of the autonomic nervous system, unspecified: Secondary | ICD-10-CM | POA: Diagnosis not present

## 2018-06-10 LAB — COMPREHENSIVE METABOLIC PANEL
A/G RATIO: 1.4 (ref 1.2–2.2)
ALBUMIN: 4.2 g/dL (ref 3.5–5.5)
ALT: 32 IU/L (ref 0–32)
AST: 32 IU/L (ref 0–40)
Alkaline Phosphatase: 120 IU/L — ABNORMAL HIGH (ref 39–117)
BUN / CREAT RATIO: 18 (ref 9–23)
BUN: 10 mg/dL (ref 6–24)
Bilirubin Total: 0.4 mg/dL (ref 0.0–1.2)
CO2: 16 mmol/L — ABNORMAL LOW (ref 20–29)
Calcium: 9.4 mg/dL (ref 8.7–10.2)
Chloride: 101 mmol/L (ref 96–106)
Creatinine, Ser: 0.57 mg/dL (ref 0.57–1.00)
GFR, EST AFRICAN AMERICAN: 130 mL/min/{1.73_m2} (ref >59)
GFR, EST NON AFRICAN AMERICAN: 113 mL/min/{1.73_m2} (ref >59)
GLOBULIN, TOTAL: 3 g/dL (ref 1.5–4.5)
Glucose: 214 mg/dL — ABNORMAL HIGH (ref 65–99)
POTASSIUM: 4 mmol/L (ref 3.5–5.2)
SODIUM: 141 mmol/L (ref 134–144)
TOTAL PROTEIN: 7.2 g/dL (ref 6.0–8.5)

## 2018-06-10 LAB — HEMOGLOBIN A1C
ESTIMATED AVERAGE GLUCOSE: 160 mg/dL
Hgb A1c MFr Bld: 7.2 % — ABNORMAL HIGH (ref 4.8–5.6)

## 2018-06-10 LAB — LIPID PANEL
CHOL/HDL RATIO: 6.3 ratio — AB (ref 0.0–4.4)
Cholesterol, Total: 202 mg/dL — ABNORMAL HIGH (ref 100–199)
HDL: 32 mg/dL — AB (ref >39)
Triglycerides: 425 mg/dL — ABNORMAL HIGH (ref 0–149)

## 2018-06-10 LAB — CBC WITH DIFFERENTIAL/PLATELET
Basophils Absolute: 0 10*3/uL (ref 0.0–0.2)
Basos: 1 %
EOS (ABSOLUTE): 0.1 10*3/uL (ref 0.0–0.4)
EOS: 2 %
HEMATOCRIT: 39.4 % (ref 34.0–46.6)
Hemoglobin: 13.9 g/dL (ref 11.1–15.9)
IMMATURE GRANS (ABS): 0.1 10*3/uL (ref 0.0–0.1)
IMMATURE GRANULOCYTES: 1 %
LYMPHS: 28 %
Lymphocytes Absolute: 1.8 10*3/uL (ref 0.7–3.1)
MCH: 30.6 pg (ref 26.6–33.0)
MCHC: 35.3 g/dL (ref 31.5–35.7)
MCV: 87 fL (ref 79–97)
Monocytes Absolute: 0.3 10*3/uL (ref 0.1–0.9)
Monocytes: 5 %
NEUTROS PCT: 63 %
Neutrophils Absolute: 4.2 10*3/uL (ref 1.4–7.0)
Platelets: 314 10*3/uL (ref 150–450)
RBC: 4.54 x10E6/uL (ref 3.77–5.28)
RDW: 13.8 % (ref 12.3–15.4)
WBC: 6.5 10*3/uL (ref 3.4–10.8)

## 2018-06-10 LAB — TSH: TSH: 1.32 u[IU]/mL (ref 0.450–4.500)

## 2018-06-11 ENCOUNTER — Telehealth: Payer: Self-pay

## 2018-06-11 ENCOUNTER — Other Ambulatory Visit: Payer: Self-pay | Admitting: Family Medicine

## 2018-06-11 DIAGNOSIS — R110 Nausea: Secondary | ICD-10-CM

## 2018-06-11 MED ORDER — PROMETHAZINE HCL 25 MG PO TABS: 25.0000 mg | ORAL_TABLET | Freq: Three times a day (TID) | ORAL | 0 refills | Status: DC | PRN

## 2018-06-11 NOTE — Telephone Encounter (Signed)
RX called in at [facility] pharmacy. Patient advised.  

## 2018-06-11 NOTE — Telephone Encounter (Signed)
LMTCB  Also, need to increase Metformin to 1000 mg BID.

## 2018-06-11 NOTE — Telephone Encounter (Signed)
Promethazine 25 mg 1 tablet TID prn nausea #21.

## 2018-06-11 NOTE — Telephone Encounter (Signed)
Patient advised. She states she was only taking Metformin 500 mg daily instead of BID due to nausea. She states she just now started taking the Metformin BID. [facility] She prefers to try Metformin 500  Mg BID until follow up appointment before increasing dose. Follow up scheduled.

## 2018-06-11 NOTE — Telephone Encounter (Signed)
-----   Message from Tamsen Roersennis E Chrismon, [place] sent at 06/11/2018  9:40 AM EDT ----- Glucose high at 214 and Hgb A1C worse at 7.2%. Triglycerides much higher and HDL low. Unable to calculate LDL due to high triglycerides. May try Krill Oil 1000 mg qd with Red Yeast Rice 600 mg qd (both are OTC supplements) and need to lose weight. Recheck progress in 3 months.

## 2018-06-19 ENCOUNTER — Telehealth: Payer: Self-pay | Admitting: Family Medicine

## 2018-06-19 MED ORDER — FLUCONAZOLE 150 MG PO TABS: 150.0000 mg | ORAL_TABLET | Freq: Once | ORAL | 0 refills | Status: AC

## 2018-06-19 NOTE — Telephone Encounter (Signed)
Patient called in asking for [facility] [address]

## 2018-06-22 ENCOUNTER — Other Ambulatory Visit: Payer: Self-pay | Admitting: Family Medicine

## 2018-06-22 DIAGNOSIS — G8921 Chronic pain due to trauma: Secondary | ICD-10-CM

## 2018-06-22 MED ORDER — OXYCODONE HCL 5 MG PO TABS: ORAL_TABLET | ORAL | 0 refills | Status: DC

## 2018-06-22 NOTE — Telephone Encounter (Signed)
Pt contacted office for refill request on the following medications:  oxyCODONE (OXY IR/ROXICODONE) 5 MG immediate release tablet  [facility] [address]  Last Rx: 05/26/18 LOV: 05/26/18 with Maurine Ministerennis NOV: 09/11/18 Pt stated her PCP is Dr. Sherrie MustacheFisher but she has recently been seeing Maurine Ministerennis. Maurine MinisterDennis is out of the office for a few weeks can Dr. Sherrie MustacheFisher refill the medication? Please advise. Thanks TNP

## 2018-07-06 ENCOUNTER — Other Ambulatory Visit: Payer: Self-pay | Admitting: Family Medicine

## 2018-07-06 DIAGNOSIS — G8921 Chronic pain due to trauma: Secondary | ICD-10-CM

## 2018-07-21 ENCOUNTER — Other Ambulatory Visit: Payer: Self-pay | Admitting: Family Medicine

## 2018-07-21 DIAGNOSIS — G8921 Chronic pain due to trauma: Secondary | ICD-10-CM

## 2018-07-21 NOTE — Telephone Encounter (Signed)
Pt contacted office for refill request on the following medications:  oxyCODONE (OXY IR/ROXICODONE) 5 MG immediate release tablet   [facility] [address]  Last Rx: 06/22/18 LOV: 05/26/18 with Maurine Ministerennis Please advise. Thanks TNP

## 2018-07-22 MED ORDER — OXYCODONE HCL 5 MG PO TABS: ORAL_TABLET | ORAL | 0 refills | Status: DC

## 2018-08-14 ENCOUNTER — Telehealth: Payer: Self-pay | Admitting: Family Medicine

## 2018-08-14 MED ORDER — FLUCONAZOLE 150 MG PO TABS: 150.0000 mg | ORAL_TABLET | Freq: Once | ORAL | 0 refills | Status: AC

## 2018-08-14 NOTE — Telephone Encounter (Signed)
Please review. Thanks!  

## 2018-08-14 NOTE — Telephone Encounter (Signed)
Pt [facility]  [facility] [address]  She thinks she has a yeast infection   She frequently gets them  CB#  [phone]  Thank s Barth Kirksteri

## 2018-08-19 DIAGNOSIS — H52223 Regular astigmatism, bilateral: Secondary | ICD-10-CM | POA: Diagnosis not present

## 2018-08-19 DIAGNOSIS — E1165 Type 2 diabetes mellitus with hyperglycemia: Secondary | ICD-10-CM | POA: Diagnosis not present

## 2018-08-19 DIAGNOSIS — H5213 Myopia, bilateral: Secondary | ICD-10-CM | POA: Diagnosis not present

## 2018-08-19 DIAGNOSIS — Z7984 Long term (current) use of oral hypoglycemic drugs: Secondary | ICD-10-CM | POA: Diagnosis not present

## 2018-08-19 LAB — HM DIABETES EYE EXAM

## 2018-08-21 ENCOUNTER — Other Ambulatory Visit: Payer: Self-pay | Admitting: Family Medicine

## 2018-08-21 DIAGNOSIS — G8921 Chronic pain due to trauma: Secondary | ICD-10-CM

## 2018-08-21 MED ORDER — OXYCODONE HCL 5 MG PO TABS: ORAL_TABLET | ORAL | 0 refills | Status: DC

## 2018-08-21 NOTE — Telephone Encounter (Signed)
Pt needs a refill on   Oxycodone 5 mg''  She uses [facility] [address]  CB#  [phone]  Thanks Barth Kirksteri

## 2018-08-31 ENCOUNTER — Other Ambulatory Visit: Payer: Self-pay | Admitting: Family Medicine

## 2018-08-31 DIAGNOSIS — E119 Type 2 diabetes mellitus without complications: Secondary | ICD-10-CM

## 2018-09-11 ENCOUNTER — Ambulatory Visit (INDEPENDENT_AMBULATORY_CARE_PROVIDER_SITE_OTHER): Payer: Medicare Other | Admitting: Family Medicine

## 2018-09-11 ENCOUNTER — Encounter: Payer: Self-pay | Admitting: Family Medicine

## 2018-09-11 VITALS — BP 140/90 | HR 102 | Temp 98.5°F | Wt 206.8 lb

## 2018-09-11 DIAGNOSIS — E781 Pure hyperglyceridemia: Secondary | ICD-10-CM

## 2018-09-11 DIAGNOSIS — E119 Type 2 diabetes mellitus without complications: Secondary | ICD-10-CM | POA: Diagnosis not present

## 2018-09-11 DIAGNOSIS — G909 Disorder of the autonomic nervous system, unspecified: Secondary | ICD-10-CM
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
## 2018-09-16 DIAGNOSIS — G909 Disorder of the autonomic nervous system, unspecified: Secondary | ICD-10-CM | POA: Diagnosis not present

## 2018-09-16 DIAGNOSIS — E781 Pure hyperglyceridemia: Secondary | ICD-10-CM | POA: Diagnosis not present

## 2018-09-16 DIAGNOSIS — E119 Type 2 diabetes mellitus without complications: Secondary | ICD-10-CM | POA: Diagnosis not present

## 2018-09-17 ENCOUNTER — Other Ambulatory Visit: Payer: Self-pay | Admitting: Family Medicine

## 2018-09-17 DIAGNOSIS — E119 Type 2 diabetes mellitus without complications: Secondary | ICD-10-CM

## 2018-09-17 LAB — LIPID PANEL
CHOLESTEROL TOTAL: 207 mg/dL — AB (ref 100–199)
Chol/HDL Ratio: 7.7 ratio — ABNORMAL HIGH (ref 0.0–4.4)
HDL: 27 mg/dL — ABNORMAL LOW (ref >39)
Triglycerides: 703 mg/dL (ref 0–149)

## 2018-09-17 LAB — COMPREHENSIVE METABOLIC PANEL
ALK PHOS: 127 IU/L — AB (ref 39–117)
ALT: 45 IU/L — ABNORMAL HIGH (ref 0–32)
AST: 26 IU/L (ref 0–40)
Albumin/Globulin Ratio: 1.7 (ref 1.2–2.2)
Albumin: 4.4 g/dL (ref 3.5–5.5)
BILIRUBIN TOTAL: 0.4 mg/dL (ref 0.0–1.2)
BUN / CREAT RATIO: 14 (ref 9–23)
BUN: 8 mg/dL (ref 6–24)
CHLORIDE: 96 mmol/L (ref 96–106)
CO2: 18 mmol/L — AB (ref 20–29)
Calcium: 9.4 mg/dL (ref 8.7–10.2)
Creatinine, Ser: 0.59 mg/dL (ref 0.57–1.00)
GFR calc non Af Amer: 111 mL/min/{1.73_m2} (ref >59)
GFR, EST AFRICAN AMERICAN: 128 mL/min/{1.73_m2} (ref >59)
GLOBULIN, TOTAL: 2.6 g/dL (ref 1.5–4.5)
Glucose: 294 mg/dL — ABNORMAL HIGH (ref 65–99)
POTASSIUM: 3.9 mmol/L (ref 3.5–5.2)
Sodium: 134 mmol/L (ref 134–144)
TOTAL PROTEIN: 7 g/dL (ref 6.0–8.5)

## 2018-09-17 LAB — CBC WITH DIFFERENTIAL/PLATELET
Basophils Absolute: 0.1 10*3/uL (ref 0.0–0.2)
Basos: 1 %
EOS (ABSOLUTE): 0.1 10*3/uL (ref 0.0–0.4)
EOS: 2 %
HEMOGLOBIN: 14.1 g/dL (ref 11.1–15.9)
Hematocrit: 41.2 % (ref 34.0–46.6)
IMMATURE GRANS (ABS): 0.1 10*3/uL (ref 0.0–0.1)
Immature Granulocytes: 1 %
LYMPHS ABS: 2 10*3/uL (ref 0.7–3.1)
Lymphs: 31 %
MCH: 29.4 pg (ref 26.6–33.0)
MCHC: 34.2 g/dL (ref 31.5–35.7)
MCV: 86 fL (ref 79–97)
MONOCYTES: 5 %
Monocytes Absolute: 0.4 10*3/uL (ref 0.1–0.9)
NEUTROS ABS: 4.1 10*3/uL (ref 1.4–7.0)
Neutrophils: 60 %
Platelets: 302 10*3/uL (ref 150–450)
RBC: 4.79 x10E6/uL (ref 3.77–5.28)
RDW: 12.3 % (ref 12.3–15.4)
WBC: 6.7 10*3/uL (ref 3.4–10.8)

## 2018-09-17 LAB — HEMOGLOBIN A1C
ESTIMATED AVERAGE GLUCOSE: 220 mg/dL
HEMOGLOBIN A1C: 9.3 % — AB (ref 4.8–5.6)

## 2018-09-17 LAB — TSH: TSH: 1.13 u[IU]/mL (ref 0.450–4.500)

## 2018-09-17 MED ORDER — METFORMIN HCL 1000 MG PO TABS: ORAL_TABLET | ORAL | 1 refills | Status: DC

## 2018-09-17 MED ORDER — GEMFIBROZIL 600 MG PO TABS: 600.0000 mg | ORAL_TABLET | Freq: Two times a day (BID) | ORAL | 1 refills | Status: DC

## 2018-09-17 NOTE — Progress Notes (Signed)
Done. Recheck in 3-4 weeks.

## 2018-09-21 ENCOUNTER — Other Ambulatory Visit: Payer: Self-pay | Admitting: Family Medicine

## 2018-09-21 DIAGNOSIS — G8921 Chronic pain due to trauma: Secondary | ICD-10-CM

## 2018-09-21 MED ORDER — OXYCODONE HCL 5 MG PO TABS: ORAL_TABLET | ORAL | 0 refills | Status: DC

## 2018-09-21 NOTE — Telephone Encounter (Signed)
t needs a refill on   Oxycodone 5mg   [facility] [address] [facility]  CB#  [phone]  Thanks Barth Kirks

## 2018-10-22 ENCOUNTER — Other Ambulatory Visit: Payer: Self-pay | Admitting: Family Medicine

## 2018-10-22 DIAGNOSIS — G8921 Chronic pain due to trauma: Secondary | ICD-10-CM

## 2018-10-22 MED ORDER — OXYCODONE HCL 5 MG PO TABS: ORAL_TABLET | ORAL | 0 refills | Status: DC

## 2018-10-22 NOTE — Telephone Encounter (Signed)
Pt needing a refill on: ° °oxyCODONE (OXY IR/ROXICODONE) 5 MG immediate release tablet ° °Please fill at: ° °[id number] - [place], [place] - [address] [phone] (Phone) [phone] (Fax)  ° °Thanks, °[facility] °

## 2018-10-31 ENCOUNTER — Other Ambulatory Visit: Payer: Self-pay | Admitting: Family Medicine

## 2018-11-01 NOTE — Telephone Encounter (Signed)
This patient is now followed by Maurine Ministerennis and is due to follow up with him for diabetes. I've not seen her for two years. Please advise that I sent a one month refill for alprazolam but I can't continue to fill it. She needs to make a follow up appt with dennis and talk to him about prescribing alprazolam refills in the future.

## 2018-11-20 ENCOUNTER — Other Ambulatory Visit: Payer: Self-pay | Admitting: Family Medicine

## 2018-11-20 DIAGNOSIS — G8921 Chronic pain due to trauma: Secondary | ICD-10-CM

## 2018-11-20 MED ORDER — OXYCODONE HCL 5 MG PO TABS: ORAL_TABLET | ORAL | 0 refills | Status: DC

## 2018-11-20 NOTE — Telephone Encounter (Signed)
Pt needing a refill on: ° °oxyCODONE (OXY IR/ROXICODONE) 5 MG immediate release tablet ° °Please fill at: ° °[id number] - [place], [place] - [address] AT [address] [phone] (Phone) [phone] (Fax)  ° °Thanks, °[facility] °

## 2018-12-05 ENCOUNTER — Other Ambulatory Visit: Payer: Self-pay | Admitting: Family Medicine

## 2018-12-21 ENCOUNTER — Other Ambulatory Visit: Payer: Self-pay | Admitting: Family Medicine

## 2018-12-21 ENCOUNTER — Telehealth: Payer: Self-pay | Admitting: Family Medicine

## 2018-12-21 DIAGNOSIS — G8921 Chronic pain due to trauma: Secondary | ICD-10-CM

## 2018-12-21 MED ORDER — OXYCODONE HCL 5 MG PO TABS: ORAL_TABLET | ORAL | 0 refills | Status: DC

## 2018-12-21 NOTE — Telephone Encounter (Signed)
Done but also due for diabetes follow up appointment.

## 2018-12-21 NOTE — Telephone Encounter (Signed)
Pt needing refill on:  oxyCODONE (OXY IR/ROXICODONE) 5 MG immediate release tablet  Please fill at:  [id number] [place], [place] - [address] AT [facility] [address] [phone] (Phone) [phone] (Fax)   Thanks, [facility]

## 2018-12-23 NOTE — Telephone Encounter (Signed)
Patient scheduled appointment.
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
## 2019-01-01 ENCOUNTER — Ambulatory Visit (INDEPENDENT_AMBULATORY_CARE_PROVIDER_SITE_OTHER): Payer: Medicare Other | Admitting: Family Medicine

## 2019-01-01 ENCOUNTER — Encounter: Payer: Self-pay | Admitting: Family Medicine

## 2019-01-01 VITALS — BP 122/70 | HR 72 | Temp 98.6°F | Resp 16 | Wt 204.0 lb

## 2019-01-01 DIAGNOSIS — E781 Pure hyperglyceridemia: Secondary | ICD-10-CM

## 2019-01-01 DIAGNOSIS — N319 Neuromuscular dysfunction of bladder, unspecified: Secondary | ICD-10-CM

## 2019-01-01 DIAGNOSIS — E119 Type 2 diabetes mellitus without complications: Secondary | ICD-10-CM

## 2019-01-01 DIAGNOSIS — G8921 Chronic pain due to trauma: Secondary | ICD-10-CM | POA: Diagnosis not present

## 2019-01-01 LAB — POCT GLYCOSYLATED HEMOGLOBIN (HGB A1C)
ESTIMATED AVERAGE GLUCOSE: 154
Hemoglobin A1C: 7 % — AB (ref 4.0–5.6)

## 2019-01-18 ENCOUNTER — Other Ambulatory Visit: Payer: Self-pay | Admitting: Family Medicine

## 2019-01-18 DIAGNOSIS — R110 Nausea: Secondary | ICD-10-CM

## 2019-01-20 ENCOUNTER — Encounter: Payer: Self-pay | Admitting: Family Medicine

## 2019-01-20 ENCOUNTER — Ambulatory Visit (INDEPENDENT_AMBULATORY_CARE_PROVIDER_SITE_OTHER): Payer: Medicare Other | Admitting: Family Medicine

## 2019-01-20 VITALS — BP 160/100 | HR 122 | Temp 99.5°F | Resp 18 | Wt 201.0 lb

## 2019-01-20 DIAGNOSIS — R059 Cough, unspecified: Secondary | ICD-10-CM

## 2019-01-20 DIAGNOSIS — R05 Cough: Secondary | ICD-10-CM | POA: Diagnosis not present

## 2019-01-20 DIAGNOSIS — R6889 Other general symptoms and signs: Secondary | ICD-10-CM | POA: Insufficient documentation

## 2019-01-20 MED ORDER — AZITHROMYCIN 250 MG PO TABS: ORAL_TABLET | ORAL | 0 refills | Status: AC

## 2019-01-20 MED ORDER — OSELTAMIVIR PHOSPHATE 75 MG PO CAPS: 75.0000 mg | ORAL_CAPSULE | Freq: Two times a day (BID) | ORAL | 0 refills | Status: AC

## 2019-01-20 MED ORDER — HYDROCODONE-HOMATROPINE 5-1.5 MG/5ML PO SYRP: 5.0000 mL | ORAL_SOLUTION | Freq: Three times a day (TID) | ORAL | 0 refills | Status: DC | PRN

## 2019-01-20 NOTE — Progress Notes (Signed)
     Patient: Alison Landry Female    DOB: 01/06/1971   48 y.o.   MRN: [id number] Visit Date: 01/20/2019  Today's Provider: Donald Fisher, MD   Chief Complaint  Patient presents with  . Cough   Subjective:     Cough  This is a new problem. Episode onset: 2 days ago. The problem has been gradually worsening. The cough is non-productive. Associated symptoms include chills, a sore throat and shortness of breath. Pertinent negatives include no chest pain, ear congestion, ear pain, fever, headaches, hemoptysis, nasal congestion, postnasal drip, rhinorrhea or wheezing. Treatments tried: Robitussin, DayQuil, NyQuil, Delsym and Mucinex. The treatment provided no relief.  Not achy or headaches.   Allergies  Allergen Reactions  . Nalbuphine Other (See Comments)     Current Outpatient Medications:  .  ALPRAZolam (XANAX) 0.5 MG tablet, TAKE 1/2 TO 1 TABLET BY MOUTH EVERY DAY AS NEEDED, Disp: 30 tablet, Rfl: 5 .  blood glucose meter kit and supplies KIT, Dispense based on patient and insurance preference. Use up to four times daily as directed. (FOR ICD-9 250.00, 250.01)., Disp: 1 each, Rfl: 0 .  fluconazole (DIFLUCAN) 150 MG tablet, , Disp: , Rfl: 0 .  gabapentin (NEURONTIN) 600 MG tablet, Take 1 tablet (600 mg total) by mouth 3 (three) times daily., Disp: 90 tablet, Rfl: 12 .  gemfibrozil (LOPID) 600 MG tablet, Take 1 tablet (600 mg total) by mouth 2 (two) times daily before a meal., Disp: 180 tablet, Rfl: 1 .  glucose blood test strip, CHECK FASTING BLOOD SUGAR ONCE DAILY, Disp: 100 each, Rfl: 4 .  Lancets (ONETOUCH DELICA PLUS LANCET33G) MISC, CHECK FASTING BLOOD SUGAR ONCE DAILY AS DIRECTED, Disp: 100 each, Rfl: 4 .  metFORMIN (GLUCOPHAGE) 1000 MG tablet, TAKE 1 TABLET(500 MG) BY MOUTH TWICE DAILY WITH A MEAL, Disp: 180 tablet, Rfl: 1 .  metroNIDAZOLE (METROGEL) 0.75 % vaginal gel, INSERT ONE APPLICATORFUL VAGINALLY AT BEDTIME, Disp: 70 g, Rfl: 3 .  oxyCODONE (OXY IR/ROXICODONE) 5 MG  immediate release tablet, Take 1 and 1/2 three times a day., Disp: 135 tablet, Rfl: 0 .  promethazine (PHENERGAN) 25 MG tablet, TAKE 1 TABLET(25 MG) BY MOUTH EVERY 8 HOURS AS NEEDED FOR NAUSEA OR VOMITING, Disp: 21 tablet, Rfl: 0 .  sertraline (ZOLOFT) 100 MG tablet, Take 1 tablet (100 mg total) by mouth daily., Disp: 30 tablet, Rfl: 0 .  tiZANidine (ZANAFLEX) 4 MG tablet, TAKE 1 TABLET BY MOUTH EVERY 4 TO 6 HOURS AS NEEDED, Disp: 120 tablet, Rfl: 1 .  traMADol (ULTRAM) 50 MG tablet, Take 1 tablet (50 mg total) by mouth 3 (three) times daily as needed., Disp: 90 tablet, Rfl: 4  Review of Systems  Constitutional: Positive for chills, diaphoresis and fatigue. Negative for appetite change and fever.  HENT: Positive for congestion (chest congestion) and sore throat. Negative for ear pain, postnasal drip and rhinorrhea.   Respiratory: Positive for cough, chest tightness and shortness of breath. Negative for hemoptysis and wheezing.        Hurts to deep breath  Cardiovascular: Negative for chest pain and palpitations.  Gastrointestinal: Positive for nausea. Negative for abdominal pain and vomiting.  Neurological: Negative for dizziness, weakness and headaches.    Social History   Tobacco Use  . Smoking status: Never Smoker  . Smokeless tobacco: Never Used  Substance Use Topics  . Alcohol use: Yes    Alcohol/week: 0.0 standard drinks    Comment: Occasionally. Once a month.         Objective:   BP (!) 160/100 (BP Location: Left Arm, Cuff Size: Large)   Pulse (!) 122   Temp 99.5 F (37.5 C) (Oral)   Resp 18   Wt 201 lb (91.2 kg)   SpO2 96% Comment: room air  BMI 34.50 kg/m  Vitals:   01/20/19 1112 01/20/19 1119  BP: (!) 166/110 (!) 160/100  Pulse: (!) 122   Resp: 18   Temp: 99.5 F (37.5 C)   TempSrc: Oral   SpO2: 96%   Weight: 201 lb (91.2 kg)      Physical Exam  General Appearance:    Alert, cooperative, no distress  HENT:     Eyes:    PERRL, conjunctiva/corneas clear,  EOM's intact       Lungs:     Clear to auscultation bilaterally, respirations unlabored  Heart:    Regular rate and rhythm  Neurologic:   Awake, alert, oriented x 3. No apparent focal neurological           defect.          Assessment & Plan    1. Flu-like symptoms  - oseltamivir (TAMIFLU) 75 MG capsule; Take 1 capsule (75 mg total) by mouth 2 (two) times daily for 5 days.  Dispense: 10 capsule; Refill: 0  2. Cough  - azithromycin (ZITHROMAX) 250 MG tablet; 2 by mouth today, then 1 daily for 4 days  Dispense: 6 tablet; Refill: 0 - HYDROcodone-homatropine (HYCODAN) 5-1.5 MG/5ML syrup; Take 5 mLs by mouth every 8 (eight) hours as needed.  Dispense: 100 mL; Refill: 0  Call if symptoms change or if not rapidly improving.        Lelon Huh, MD  [facility]

## 2019-01-20 NOTE — Patient Instructions (Signed)
.   Please review the attached list of medications and notify my office if there are any errors.   . Please bring all of your medications to every appointment so we can make sure that our medication list is the same as yours.    You can take OTC Mucinex DM during the day for your cough. Do not take any other cough or cold medications   Do not take oxycodone within 4 hours of the hydrocodone cough syrup

## 2019-01-21 ENCOUNTER — Other Ambulatory Visit: Payer: Self-pay | Admitting: Family Medicine

## 2019-01-21 DIAGNOSIS — G8921 Chronic pain due to trauma: Secondary | ICD-10-CM

## 2019-01-21 MED ORDER — OXYCODONE HCL 5 MG PO TABS: ORAL_TABLET | ORAL | 0 refills | Status: DC

## 2019-01-21 NOTE — Telephone Encounter (Signed)
pt needs refill on oxycodone 5 mg   [facility]  [address]  CB#  [phone]  Thanks Barth Kirks

## 2019-02-05 ENCOUNTER — Other Ambulatory Visit: Payer: Self-pay

## 2019-02-05 DIAGNOSIS — R059 Cough, unspecified: Secondary | ICD-10-CM

## 2019-02-05 DIAGNOSIS — R05 Cough: Secondary | ICD-10-CM

## 2019-02-05 NOTE — Telephone Encounter (Signed)
Patient called office requesting refill for Hycodan. Patient states that Dr. Sherrie Mustache prescribed her medication to treat for URI symptoms. Patient states that medication was helping her with cough but states that since she finished medicine and cough has returned. Patient is requesting refill on medication. KW

## 2019-02-05 NOTE — Telephone Encounter (Signed)
Please advise 

## 2019-02-08 ENCOUNTER — Other Ambulatory Visit: Payer: Self-pay | Admitting: Family Medicine

## 2019-02-08 DIAGNOSIS — E119 Type 2 diabetes mellitus without complications: Secondary | ICD-10-CM

## 2019-02-08 MED ORDER — HYDROCODONE-HOMATROPINE 5-1.5 MG/5ML PO SYRP: 5.0000 mL | ORAL_SOLUTION | Freq: Three times a day (TID) | ORAL | 0 refills | Status: DC | PRN

## 2019-02-08 NOTE — Telephone Encounter (Signed)
Do you want to refill? Chronic oxycodone and xanax.

## 2019-02-19 ENCOUNTER — Other Ambulatory Visit: Payer: Self-pay | Admitting: Family Medicine

## 2019-02-19 DIAGNOSIS — G8921 Chronic pain due to trauma: Secondary | ICD-10-CM

## 2019-02-19 MED ORDER — OXYCODONE HCL 5 MG PO TABS: ORAL_TABLET | ORAL | 0 refills | Status: DC

## 2019-02-19 NOTE — Telephone Encounter (Signed)
Pt needing a refill on: ° °oxyCODONE (OXY IR/ROXICODONE) 5 MG immediate release tablet ° °Please fill at: ° °[id number] - [place], [place] - [address] [phone] (Phone) °[phone] (Fax)  ° °Thanks, °[facility] °

## 2019-02-25 ENCOUNTER — Telehealth: Payer: Self-pay

## 2019-02-25 NOTE — Telephone Encounter (Signed)
Need more information regarding nausea and when it stopped responding to the Phenergan. Also, need to know the side effects she is having.

## 2019-02-25 NOTE — Telephone Encounter (Signed)
Patient wants to know if she could have Zofran send in to the pharmacy for nausea instead of the phenergan due to the side effects. Please advise.

## 2019-02-25 NOTE — Telephone Encounter (Signed)
Please advise 

## 2019-03-01 NOTE — Telephone Encounter (Signed)
Need to schedule appointment if she is still having nausea.

## 2019-03-01 NOTE — Telephone Encounter (Signed)
LMOVM for pt to return call 

## 2019-03-01 NOTE — Telephone Encounter (Signed)
Patient stated Phenergan works however it makes her very drowsy. Patient states she has taken Zofran in the past and it work well for her.

## 2019-03-02 NOTE — Telephone Encounter (Signed)
Pt advised.  She states the nausea is not that bad at this time.  She says she will call back if she needs to schedule an appointment.  Thanks,   -Vernona Rieger

## 2019-03-02 NOTE — Telephone Encounter (Signed)
LMTCB 03/02/2019  Thanks,   -Vernona Rieger

## 2019-03-23 ENCOUNTER — Other Ambulatory Visit: Payer: Self-pay | Admitting: Family Medicine

## 2019-03-23 ENCOUNTER — Other Ambulatory Visit: Payer: Self-pay

## 2019-03-23 DIAGNOSIS — G8921 Chronic pain due to trauma: Secondary | ICD-10-CM

## 2019-03-23 DIAGNOSIS — E119 Type 2 diabetes mellitus without complications: Secondary | ICD-10-CM

## 2019-03-23 MED ORDER — OXYCODONE HCL 5 MG PO TABS: ORAL_TABLET | ORAL | 0 refills | Status: DC

## 2019-03-23 NOTE — Telephone Encounter (Signed)
Patiwent is requesting a refill on   oxyCODONE (OXY IR/ROXICODONE) 5 MG immediate release tablet Be sent to [facility].

## 2019-03-23 NOTE — Telephone Encounter (Signed)
Pharmacy requesting refills. Thanks!  

## 2019-04-21 ENCOUNTER — Other Ambulatory Visit: Payer: Self-pay

## 2019-04-21 DIAGNOSIS — G8921 Chronic pain due to trauma: Secondary | ICD-10-CM

## 2019-04-22 MED ORDER — OXYCODONE HCL 5 MG PO TABS: ORAL_TABLET | ORAL | 0 refills | Status: DC

## 2019-05-20 ENCOUNTER — Other Ambulatory Visit: Payer: Self-pay | Admitting: Family Medicine

## 2019-05-20 DIAGNOSIS — G8921 Chronic pain due to trauma: Secondary | ICD-10-CM

## 2019-05-20 MED ORDER — OXYCODONE HCL 5 MG PO TABS: ORAL_TABLET | ORAL | 0 refills | Status: DC

## 2019-05-20 NOTE — Telephone Encounter (Signed)
Pt needs a refill on oxycodone 5 mg  [facility] [address]  Thanks Con Memos

## 2019-05-24 ENCOUNTER — Other Ambulatory Visit: Payer: Self-pay

## 2019-05-24 NOTE — Progress Notes (Signed)
This encounter was created in error - please disregard.

## 2019-05-31 ENCOUNTER — Other Ambulatory Visit: Payer: Self-pay | Admitting: Family Medicine

## 2019-05-31 DIAGNOSIS — E119 Type 2 diabetes mellitus without complications: Secondary | ICD-10-CM

## 2019-06-17 ENCOUNTER — Other Ambulatory Visit: Payer: Self-pay | Admitting: Family Medicine

## 2019-06-18 ENCOUNTER — Other Ambulatory Visit: Payer: Self-pay

## 2019-06-18 DIAGNOSIS — G8921 Chronic pain due to trauma: Secondary | ICD-10-CM

## 2019-06-18 MED ORDER — OXYCODONE HCL 5 MG PO TABS: ORAL_TABLET | ORAL | 0 refills | Status: DC

## 2019-06-18 NOTE — Telephone Encounter (Signed)
Patient called requesting refills. Thanks!  

## 2019-06-23 ENCOUNTER — Other Ambulatory Visit: Payer: Self-pay

## 2019-06-23 ENCOUNTER — Ambulatory Visit (INDEPENDENT_AMBULATORY_CARE_PROVIDER_SITE_OTHER): Payer: Medicare Other

## 2019-06-23 DIAGNOSIS — Z0000 Encounter for general adult medical examination without abnormal findings: Secondary | ICD-10-CM | POA: Diagnosis not present

## 2019-06-23 NOTE — Patient Instructions (Signed)
Alison Landry , Thank you for taking time to come for your Medicare Wellness Visit. I appreciate your ongoing commitment to your health goals. Please review the following plan we discussed and let me know if I can assist you in the future.   Screening recommendations/referrals: Recommended yearly ophthalmology/optometry visit for glaucoma screening and checkup Recommended yearly dental visit for hygiene and checkup  Vaccinations: Influenza vaccine: Due fall 2020 Tdap vaccine: Pt declines today.     Advanced directives: Advance directive discussed with you today. Even though you declined this today please call our office should you change your mind and we can give you the proper paperwork for you to fill out.  Conditions/risks identified: Fall risk prevention discussed today.   Next appointment: 06/26/20 for AWV. Declined scheduling a CPE for this year.  Preventive Care 40-64 Years, Female Preventive care refers to lifestyle choices and visits with your health care provider that can promote health and wellness. What does preventive care include?  A yearly physical exam. This is also called an annual well check.  Dental exams once or twice a year.  Routine eye exams. Ask your health care provider how often you should have your eyes checked.  Personal lifestyle choices, including:  Daily care of your teeth and gums.  Regular physical activity.  Eating a healthy diet.  Avoiding tobacco and drug use.  Limiting alcohol use.  Practicing safe sex.  Taking low-dose aspirin daily starting at age 35.  Taking vitamin and mineral supplements as recommended by your health care provider. What happens during an annual well check? The services and screenings done by your health care provider during your annual well check will depend on your age, overall health, lifestyle risk factors, and family history of disease. Counseling  Your health care provider may ask you questions about your:   Alcohol use.  Tobacco use.  Drug use.  Emotional well-being.  Home and relationship well-being.  Sexual activity.  Eating habits.  Work and work Statistician.  Method of birth control.  Menstrual cycle.  Pregnancy history. Screening  You may have the following tests or measurements:  Height, weight, and BMI.  Blood pressure.  Lipid and cholesterol levels. These may be checked every 5 years, or more frequently if you are over 43 years old.  Skin check.  Lung cancer screening. You may have this screening every year starting at age 20 if you have a 30-pack-year history of smoking and currently smoke or have quit within the past 15 years.  Fecal occult blood test (FOBT) of the stool. You may have this test every year starting at age 18.  Flexible sigmoidoscopy or colonoscopy. You may have a sigmoidoscopy every 5 years or a colonoscopy every 10 years starting at age 70.  Hepatitis C blood test.  Hepatitis B blood test.  Sexually transmitted disease (STD) testing.  Diabetes screening. This is done by checking your blood sugar (glucose) after you have not eaten for a while (fasting). You may have this done every 1-3 years.  Mammogram. This may be done every 1-2 years. Talk to your health care provider about when you should start having regular mammograms. This may depend on whether you have a family history of breast cancer.  BRCA-related cancer screening. This may be done if you have a family history of breast, ovarian, tubal, or peritoneal cancers.  Pelvic exam and Pap test. This may be done every 3 years starting at age 42. Starting at age 58, this may be done  every 5 years if you have a Pap test in combination with an HPV test.  Bone density scan. This is done to screen for osteoporosis. You may have this scan if you are at high risk for osteoporosis. Discuss your test results, treatment options, and if necessary, the need for more tests with your health care  provider. Vaccines  Your health care provider may recommend certain vaccines, such as:  Influenza vaccine. This is recommended every year.  Tetanus, diphtheria, and acellular pertussis (Tdap, Td) vaccine. You may need a Td booster every 10 years.  Zoster vaccine. You may need this after age 20.  Pneumococcal 13-valent conjugate (PCV13) vaccine. You may need this if you have certain conditions and were not previously vaccinated.  Pneumococcal polysaccharide (PPSV23) vaccine. You may need one or two doses if you smoke cigarettes or if you have certain conditions. Talk to your health care provider about which screenings and vaccines you need and how often you need them. This information is not intended to replace advice given to you by your health care provider. Make sure you discuss any questions you have with your health care provider. Document Released: 12/15/2015 Document Revised: 08/07/2016 Document Reviewed: 09/19/2015 Elsevier Interactive Patient Education  2017 [place] Prevention in the Home Falls can cause injuries. They can happen to people of all ages. There are many things you can do to make your home safe and to help prevent falls. What can I do on the outside of my home?  Regularly fix the edges of walkways and driveways and fix any cracks.  Remove anything that might make you trip as you walk through a door, such as a raised step or threshold.  Trim any bushes or trees on the path to your home.  Use bright outdoor lighting.  Clear any walking paths of anything that might make someone trip, such as rocks or tools.  Regularly check to see if handrails are loose or broken. Make sure that both sides of any steps have handrails.  Any raised decks and porches should have guardrails on the edges.  Have any leaves, snow, or ice cleared regularly.  Use sand or salt on walking paths during winter.  Clean up any spills in your garage right away. This includes  oil or grease spills. What can I do in the bathroom?  Use night lights.  Install grab bars by the toilet and in the tub and shower. Do not use towel bars as grab bars.  Use non-skid mats or decals in the tub or shower.  If you need to sit down in the shower, use a plastic, non-slip stool.  Keep the floor dry. Clean up any water that spills on the floor as soon as it happens.  Remove soap buildup in the tub or shower regularly.  Attach bath mats securely with double-sided non-slip rug tape.  Do not have throw rugs and other things on the floor that can make you trip. What can I do in the bedroom?  Use night lights.  Make sure that you have a light by your bed that is easy to reach.  Do not use any sheets or blankets that are too big for your bed. They should not hang down onto the floor.  Have a firm chair that has side arms. You can use this for support while you get dressed.  Do not have throw rugs and other things on the floor that can make you trip. What can  I do in the kitchen?  Clean up any spills right away.  Avoid walking on wet floors.  Keep items that you use a lot in easy-to-reach places.  If you need to reach something above you, use a strong step stool that has a grab bar.  Keep electrical cords out of the way.  Do not use floor polish or wax that makes floors slippery. If you must use wax, use non-skid floor wax.  Do not have throw rugs and other things on the floor that can make you trip. What can I do with my stairs?  Do not leave any items on the stairs.  Make sure that there are handrails on both sides of the stairs and use them. Fix handrails that are broken or loose. Make sure that handrails are as long as the stairways.  Check any carpeting to make sure that it is firmly attached to the stairs. Fix any carpet that is loose or worn.  Avoid having throw rugs at the top or bottom of the stairs. If you do have throw rugs, attach them to the floor  with carpet tape.  Make sure that you have a light switch at the top of the stairs and the bottom of the stairs. If you do not have them, ask someone to add them for you. What else can I do to help prevent falls?  Wear shoes that:  Do not have high heels.  Have rubber bottoms.  Are comfortable and fit you well.  Are closed at the toe. Do not wear sandals.  If you use a stepladder:  Make sure that it is fully opened. Do not climb a closed stepladder.  Make sure that both sides of the stepladder are locked into place.  Ask someone to hold it for you, if possible.  Clearly mark and make sure that you can see:  Any grab bars or handrails.  First and last steps.  Where the edge of each step is.  Use tools that help you move around (mobility aids) if they are needed. These include:  Canes.  Walkers.  Scooters.  Crutches.  Turn on the lights when you go into a dark area. Replace any light bulbs as soon as they burn out.  Set up your furniture so you have a clear path. Avoid moving your furniture around.  If any of your floors are uneven, fix them.  If there are any pets around you, be aware of where they are.  Review your medicines with your doctor. Some medicines can make you feel dizzy. This can increase your chance of falling. Ask your doctor what other things that you can do to help prevent falls. This information is not intended to replace advice given to you by your health care provider. Make sure you discuss any questions you have with your health care provider. Document Released: 09/14/2009 Document Revised: 04/25/2016 Document Reviewed: 12/23/2014 Elsevier Interactive Patient Education  2017 [employer].
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
## 2019-07-19 ENCOUNTER — Other Ambulatory Visit: Payer: Self-pay | Admitting: Family Medicine

## 2019-07-19 DIAGNOSIS — G8921 Chronic pain due to trauma: Secondary | ICD-10-CM

## 2019-07-19 NOTE — Telephone Encounter (Signed)
Pt needs refill on oxycodone 5mg   [facility]  [address]

## 2019-07-19 NOTE — Telephone Encounter (Signed)
Please review. Dennis pt. Thanks!  

## 2019-07-20 MED ORDER — OXYCODONE HCL 5 MG PO TABS: ORAL_TABLET | ORAL | 0 refills | Status: DC

## 2019-08-19 ENCOUNTER — Other Ambulatory Visit: Payer: Self-pay | Admitting: Family Medicine

## 2019-08-19 DIAGNOSIS — G8921 Chronic pain due to trauma: Secondary | ICD-10-CM

## 2019-08-19 MED ORDER — OXYCODONE HCL 5 MG PO TABS: ORAL_TABLET | ORAL | 0 refills | Status: DC

## 2019-08-19 NOTE — Telephone Encounter (Signed)
Pt needs a refill on Oxycodone 5 mg  [facility]  [address]

## 2019-08-26 ENCOUNTER — Other Ambulatory Visit: Payer: Self-pay | Admitting: Family Medicine

## 2019-08-26 DIAGNOSIS — G909 Disorder of the autonomic nervous system, unspecified: Secondary | ICD-10-CM

## 2019-09-17 ENCOUNTER — Other Ambulatory Visit: Payer: Self-pay | Admitting: Family Medicine

## 2019-09-17 DIAGNOSIS — G8921 Chronic pain due to trauma: Secondary | ICD-10-CM

## 2019-09-17 MED ORDER — OXYCODONE HCL 5 MG PO TABS: ORAL_TABLET | ORAL | 0 refills | Status: DC

## 2019-09-17 NOTE — Telephone Encounter (Signed)
Pt needing a refill on:  oxyCODONE (OXY IR/ROXICODONE) 5 MG immediate release tablet  Please fill at:  [facility] [id number], [place] [phone] (Phone) [phone] (Fax)   Thanks, [place]

## 2019-09-21 ENCOUNTER — Other Ambulatory Visit: Payer: Self-pay | Admitting: Family Medicine

## 2019-09-21 DIAGNOSIS — E119 Type 2 diabetes mellitus without complications: Secondary | ICD-10-CM

## 2019-10-18 ENCOUNTER — Other Ambulatory Visit: Payer: Self-pay | Admitting: Family Medicine

## 2019-10-18 DIAGNOSIS — G8921 Chronic pain due to trauma: Secondary | ICD-10-CM

## 2019-10-18 NOTE — Telephone Encounter (Signed)
Pt request refill  oxyCODONE (OXY IR/ROXICODONE) 5 MG immediate release tablet  Also would like the dr to send in fluconazole (DIFLUCAN) 150 MG tablet  Pt states she has had spike in blood sugars which gives her yeast infection at times.  She states the dr aware of this issue.  [id number], [place] [phone] (Phone) [phone] (Fax)

## 2019-10-20 MED ORDER — FLUCONAZOLE 150 MG PO TABS: 150.0000 mg | ORAL_TABLET | Freq: Once | ORAL | 0 refills | Status: AC

## 2019-10-20 MED ORDER — OXYCODONE HCL 5 MG PO TABS: ORAL_TABLET | ORAL | 0 refills | Status: DC

## 2019-10-20 NOTE — Telephone Encounter (Signed)
Pt is aware refill can take up to 3 business days 

## 2019-10-22 ENCOUNTER — Other Ambulatory Visit: Payer: Self-pay

## 2019-10-22 ENCOUNTER — Ambulatory Visit (INDEPENDENT_AMBULATORY_CARE_PROVIDER_SITE_OTHER): Payer: Medicare Other | Admitting: Family Medicine

## 2019-10-22 ENCOUNTER — Ambulatory Visit: Payer: Self-pay | Admitting: Family Medicine

## 2019-10-22 ENCOUNTER — Encounter: Payer: Self-pay | Admitting: Family Medicine

## 2019-10-22 VITALS — BP 139/85 | HR 105 | Temp 97.1°F | Resp 16 | Ht 64.0 in | Wt 200.0 lb

## 2019-10-22 DIAGNOSIS — Z0283 Encounter for blood-alcohol and blood-drug test: Secondary | ICD-10-CM

## 2019-10-22 DIAGNOSIS — R110 Nausea: Secondary | ICD-10-CM

## 2019-10-22 DIAGNOSIS — E119 Type 2 diabetes mellitus without complications: Secondary | ICD-10-CM

## 2019-10-22 DIAGNOSIS — N319 Neuromuscular dysfunction of bladder, unspecified: Secondary | ICD-10-CM

## 2019-10-22 DIAGNOSIS — G8921 Chronic pain due to trauma: Secondary | ICD-10-CM

## 2019-10-22 DIAGNOSIS — G909 Disorder of the autonomic nervous system, unspecified: Secondary | ICD-10-CM

## 2019-10-22 DIAGNOSIS — F410 Panic disorder [episodic paroxysmal anxiety] without agoraphobia: Secondary | ICD-10-CM | POA: Diagnosis not present

## 2019-10-22 LAB — POCT UA - MICROALBUMIN: Microalbumin Ur, POC: 100 mg/L

## 2019-10-22 LAB — POCT GLUCOSE (DEVICE FOR HOME USE): POC Glucose: 298 mg/dl — AB (ref 70–99)

## 2019-10-22 MED ORDER — ONDANSETRON 4 MG PO TBDP: 4.0000 mg | ORAL_TABLET | Freq: Three times a day (TID) | ORAL | 0 refills | Status: DC | PRN

## 2019-10-22 NOTE — Telephone Encounter (Signed)
Pt. Called to schedule an appt. For high blood sugars.  Reported fasting BS's over last 4 days ranging from 315-370; with one reading at 289.  Reported current blood sugar taken at 10:55 was 523.  Reported she feels a little nauseated, but stated that is her usual baseline.  Denied any other ill feeling at this time.  Denied missing any doses of Metformin.  Has a 1:20 PM appt. Today with PCP.  Advised to increase water intake at this time, to aide in lowering blood sugar.  Reported eating chicken minis about 10:00 AM, and drinking 1/2 bottle water.  Called FC.  Advised to send Triage note to the office for PCP to review and make further recommendations re: ER vs. Office appt. Today.  Pt. Made aware that office will call her back with further recommendations.  Encouraged to continue drinking water at this time to help to lower blood sugar.  Verb. Understanding.   Reason for Disposition . [1] Blood glucose > 240 mg/dL (13.3 mmol/L) AND [2] urine ketones moderate-large (or more than 1+)  Answer Assessment - Initial Assessment Questions 1. BLOOD GLUCOSE: "What is your blood glucose level?"      523 @ 10:55 AM  2. ONSET: "When did you check the blood glucose?"     See above 3. USUAL RANGE: "What is your glucose level usually?" (e.g., usual fasting morning value, usual evening value)     Fasting readings 289- 370 4. KETONES: "Do you check for ketones (urine or blood test strips)?" If yes, ask: "What does the test show now?"      *No Answer* 5. TYPE 1 or 2:  "Do you know what type of diabetes you have?"  (e.g., Type 1, Type 2, Gestational; doesn't know)     Type 2 6. INSULIN: "Do you take insulin?" "What type of insulin(s) do you use? What is the mode of delivery? (syringe, pen; injection or pump)?"      no 7. DIABETES PILLS: "Do you take any pills for your diabetes?" If yes, ask: "Have you missed taking any pills recently?"     Metformin; last dose at 9:30 AM;  8. OTHER SYMPTOMS: "Do you have any  symptoms?" (e.g., fever, frequent urination, difficulty breathing, dizziness, weakness, vomiting)     Feels "a little nauseated"; (reported she feels like that all the time) Denies any symptoms 9. PREGNANCY: "Is there any chance you are pregnant?" "When was your last menstrual period?"     LMP: has had tubal ligation  Protocols used: DIABETES - HIGH BLOOD SUGAR-A-AH

## 2019-10-22 NOTE — Telephone Encounter (Signed)
Will check at office appointment at 1:20 pm today.

## 2019-10-22 NOTE — Telephone Encounter (Signed)
Please advise 
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
## 2019-10-23 LAB — MED LIST OPTION NOT SELECTED

## 2019-10-24 LAB — PAIN MGT SCRN (14 DRUGS), UR
Amphetamine Scrn, Ur: NEGATIVE ng/mL
BARBITURATE SCREEN URINE: NEGATIVE ng/mL
BENZODIAZEPINE SCREEN, URINE: NEGATIVE ng/mL
Buprenorphine, Urine: NEGATIVE ng/mL
CANNABINOIDS UR QL SCN: NEGATIVE ng/mL
Cocaine (Metab) Scrn, Ur: NEGATIVE ng/mL
Creatinine(Crt), U: 26 mg/dL (ref 20.0–300.0)
Fentanyl, Urine: NEGATIVE pg/mL
Meperidine Screen, Urine: NEGATIVE ng/mL
Methadone Screen, Urine: NEGATIVE ng/mL
OXYCODONE+OXYMORPHONE UR QL SCN: NEGATIVE ng/mL
Opiate Scrn, Ur: NEGATIVE ng/mL
Ph of Urine: 5.7 (ref 4.5–8.9)
Phencyclidine Qn, Ur: NEGATIVE ng/mL
Propoxyphene Scrn, Ur: NEGATIVE ng/mL
Tramadol Screen, Urine: NEGATIVE ng/mL

## 2019-10-25 LAB — COMPREHENSIVE METABOLIC PANEL
ALT: 40 IU/L — ABNORMAL HIGH (ref 0–32)
AST: 27 IU/L (ref 0–40)
Albumin/Globulin Ratio: 1.4 (ref 1.2–2.2)
Albumin: 4.4 g/dL (ref 3.8–4.8)
Alkaline Phosphatase: 141 IU/L — ABNORMAL HIGH (ref 39–117)
BUN/Creatinine Ratio: 14 (ref 9–23)
BUN: 9 mg/dL (ref 6–24)
Bilirubin Total: 0.3 mg/dL (ref 0.0–1.2)
CO2: 22 mmol/L (ref 20–29)
Calcium: 9.7 mg/dL (ref 8.7–10.2)
Chloride: 98 mmol/L (ref 96–106)
Creatinine, Ser: 0.66 mg/dL (ref 0.57–1.00)
GFR calc Af Amer: 123 mL/min/{1.73_m2} (ref >59)
GFR calc non Af Amer: 106 mL/min/{1.73_m2} (ref >59)
Globulin, Total: 3.1 g/dL (ref 1.5–4.5)
Glucose: 247 mg/dL — ABNORMAL HIGH (ref 65–99)
Potassium: 4 mmol/L (ref 3.5–5.2)
Sodium: 138 mmol/L (ref 134–144)
Total Protein: 7.5 g/dL (ref 6.0–8.5)

## 2019-10-25 LAB — CBC WITH DIFFERENTIAL/PLATELET
Basophils Absolute: 0.1 10*3/uL (ref 0.0–0.2)
Basos: 1 %
EOS (ABSOLUTE): 0.1 10*3/uL (ref 0.0–0.4)
Eos: 2 %
Hematocrit: 43 % (ref 34.0–46.6)
Hemoglobin: 14.5 g/dL (ref 11.1–15.9)
Immature Grans (Abs): 0.1 10*3/uL (ref 0.0–0.1)
Immature Granulocytes: 1 %
Lymphocytes Absolute: 2.6 10*3/uL (ref 0.7–3.1)
Lymphs: 34 %
MCH: 29.1 pg (ref 26.6–33.0)
MCHC: 33.7 g/dL (ref 31.5–35.7)
MCV: 86 fL (ref 79–97)
Monocytes Absolute: 0.4 10*3/uL (ref 0.1–0.9)
Monocytes: 5 %
Neutrophils Absolute: 4.4 10*3/uL (ref 1.4–7.0)
Neutrophils: 57 %
Platelets: 342 10*3/uL (ref 150–450)
RBC: 4.98 x10E6/uL (ref 3.77–5.28)
RDW: 12.4 % (ref 11.7–15.4)
WBC: 7.6 10*3/uL (ref 3.4–10.8)

## 2019-10-25 LAB — HEMOGLOBIN A1C
Est. average glucose Bld gHb Est-mCnc: 223 mg/dL
Hgb A1c MFr Bld: 9.4 % — ABNORMAL HIGH (ref 4.8–5.6)

## 2019-10-26 ENCOUNTER — Telehealth: Payer: Self-pay

## 2019-10-26 MED ORDER — GLIPIZIDE 5 MG PO TABS: 5.0000 mg | ORAL_TABLET | Freq: Every day | ORAL | 3 refills | Status: DC

## 2019-10-26 NOTE — Telephone Encounter (Signed)
-----   Message from Margo Common, [place] sent at 10/25/2019  6:50 PM EST ----- Blood sugar, urine microalbumin and Hgb A1C very high. Must take Metformin 500 mg 2 tablets BID and add Glipizide 5 mg qd #30 & 3 RF. Check fasting blood sugar daily and schedule follow up appointment in 3 weeks.

## 2019-10-26 NOTE — Telephone Encounter (Signed)
Patient advised as below. Patient verbalizes understanding and is in agreement with treatment plan.  

## 2019-10-26 NOTE — Telephone Encounter (Signed)
Which medication is needing refill? The Oxycodone refill was sent to the pharmacy on 10-20-19. Glipizide was sent today 9 10-26-19).

## 2019-10-26 NOTE — Telephone Encounter (Signed)
Patient advised as below. Patient reports she does take medication daily, and has been taking for about 20 years. Patient reports that she had been without medication for three days before the test was done. Patient also reports that she had drunk eight glasses of water before being able to void for test. Patient is requesting medication to be sent in to pharmacy. Please advise.   Notes recorded by Margo Common, PA on 10/25/2019 at 8:22 AM EST  Blood sugar high at 247 the Hgb A1C report is pending. Drug screen does not show any Oxycodone in your system. Can't prescribe this if you are not taking it. This should not be shared with anyone else.

## 2019-10-27 NOTE — Telephone Encounter (Signed)
Tried calling; no answer.   Thanks,   -Laura  

## 2019-10-27 NOTE — Telephone Encounter (Signed)
No answer and no vm

## 2019-10-27 NOTE — Telephone Encounter (Signed)
Patient requesting call back to discuss instructions for the medication glipiZIDE (GLUCOTROL) 5 MG tablet

## 2019-10-29 NOTE — Telephone Encounter (Signed)
Pt called back.  States she was verbally instructed at office to take medicine in the afternoon, but printed instructions on bottle say to take in the morning.  Pt hasn't heard from anyone on how to properly take medication and is very upset. Pt would like a call back ASAP.

## 2019-10-29 NOTE — Telephone Encounter (Signed)
Left detailed message on voicemail.  

## 2019-10-29 NOTE — Telephone Encounter (Signed)
Left a voice message for patient to return call to be advised. Triage nurse can advise patient.

## 2019-10-29 NOTE — Telephone Encounter (Signed)
Pt calling back. Pt states that this is concerning new blood medication and not the oxycodone

## 2019-10-29 NOTE — Telephone Encounter (Signed)
Take this medication in the morning with breakfast.

## 2019-10-29 NOTE — Telephone Encounter (Signed)
Patient Is checking status of  Blood sugar medication not pain medication. Please advise. Patient has been trying to get an answer since Wednesday.   Call back [phone]

## 2019-11-01 NOTE — Telephone Encounter (Signed)
No answer

## 2019-11-01 NOTE — Telephone Encounter (Signed)
Patient was advised to take the medication in the morning with breakfast.

## 2019-11-17 ENCOUNTER — Other Ambulatory Visit: Payer: Self-pay | Admitting: Family Medicine

## 2019-11-17 NOTE — Telephone Encounter (Signed)
Copied from [place] [phone]. Topic: Quick Communication - Rx Refill/Question >> Nov 17, 2019 11:23 AM Rainey Pines A wrote: Medication: oxyCODONE (OXY IR/ROXICODONE) 5 MG immediate release tablet  Has the patient contacted their pharmacy? {Yes (Agent: If no, request that the patient contact the pharmacy for the refill.) (Agent: If yes, when and what did the pharmacy advise?)Contact PCP  Preferred Pharmacy (with phone number or street name): [id number] [address], [place]  Phone:  [phone] Fax:  [phone]     Agent: Please be advised that RX refills may take up to 3 business days. We ask that you follow-up with your pharmacy.

## 2019-11-17 NOTE — Telephone Encounter (Signed)
Tried calling patient and got no answer.  Alison Landry is out of the office the rest of the month.  When asked if another provider would refill the medication they declined because patient had negative drug screen last month.

## 2019-11-19 ENCOUNTER — Encounter: Payer: Self-pay | Admitting: Adult Health

## 2019-11-19 ENCOUNTER — Encounter: Payer: Self-pay | Admitting: *Deleted

## 2019-11-19 ENCOUNTER — Other Ambulatory Visit: Payer: Self-pay

## 2019-11-19 ENCOUNTER — Ambulatory Visit (INDEPENDENT_AMBULATORY_CARE_PROVIDER_SITE_OTHER): Payer: Medicare Other | Admitting: Adult Health

## 2019-11-19 VITALS — BP 166/106 | HR 116 | Temp 98.0°F | Resp 16 | Ht 64.0 in | Wt 201.0 lb

## 2019-11-19 DIAGNOSIS — Z9119 Patient's noncompliance with other medical treatment and regimen: Secondary | ICD-10-CM | POA: Diagnosis not present

## 2019-11-19 DIAGNOSIS — Z79899 Other long term (current) drug therapy: Secondary | ICD-10-CM | POA: Diagnosis not present

## 2019-11-19 DIAGNOSIS — Z91199 Patient's noncompliance with other medical treatment and regimen due to unspecified reason: Secondary | ICD-10-CM

## 2019-11-19 DIAGNOSIS — I10 Essential (primary) hypertension: Secondary | ICD-10-CM

## 2019-11-19 DIAGNOSIS — Z0283 Encounter for blood-alcohol and blood-drug test: Secondary | ICD-10-CM | POA: Diagnosis not present

## 2019-11-19 DIAGNOSIS — N898 Other specified noninflammatory disorders of vagina: Secondary | ICD-10-CM

## 2019-11-19 MED ORDER — TERCONAZOLE 0.8 % VA CREA: 1.0000 | TOPICAL_CREAM | Freq: Every day | VAGINAL | 0 refills | Status: DC

## 2019-11-19 NOTE — Patient Instructions (Addendum)
May use Ibuprofen 800 mg every 8 hours as needed for pain.  Seek emergency care/ 911 if emergency.    Ibuprofen tablets and capsules What is this medicine? IBUPROFEN (eye BYOO proe fen) is a non-steroidal anti-inflammatory drug (NSAID). It is used for dental pain, fever, headaches or migraines, osteoarthritis, rheumatoid arthritis, or painful monthly periods. It can also relieve minor aches and pains caused by a cold, flu, or sore throat. This medicine may be used for other purposes; ask your health care provider or pharmacist if you have questions. COMMON BRAND NAME(S): Advil, Advil Junior Strength, Advil Migraine, Genpril, Ibren, IBU, Midol, Midol Cramps and Body Aches, Motrin, Motrin IB, Motrin Junior Strength, Motrin Migraine Pain, Samson-8, Toxicology Saliva Collection What should I tell my health care provider before I take this medicine? They need to know if you have any of these conditions:  cigarette smoker  coronary artery bypass graft (CABG) surgery within the past 2 weeks  drink more than 3 alcohol-containing drinks a day  heart disease  high blood pressure  history of stomach bleeding  kidney disease  liver disease  lung or breathing disease, like asthma  an unusual or allergic reaction to ibuprofen, aspirin, other NSAIDs, other medicines, foods, dyes, or preservatives  pregnant or trying to get pregnant  breast-feeding How should I use this medicine? Take this medicine by mouth with a glass of water. Follow the directions on the prescription label. Take this medicine with food if your stomach gets upset. Try to not lie down for at least 10 minutes after you take the medicine. Take your medicine at regular intervals. Do not take your medicine more often than directed. A special MedGuide will be given to you by the pharmacist with each prescription and refill. Be sure to read this information carefully each time. Talk to your pediatrician regarding the use of this  medicine in children. Special care may be needed. Overdosage: If you think you have taken too much of this medicine contact a poison control center or emergency room at once. NOTE: This medicine is only for you. Do not share this medicine with others. What if I miss a dose? If you miss a dose, take it as soon as you can. If it is almost time for your next dose, take only that dose. Do not take double or extra doses. What may interact with this medicine? Do not take this medicine with any of the following medications:  cidofovir  ketorolac  methotrexate  pemetrexed This medicine may also interact with the following medications:  alcohol  aspirin  diuretics  lithium  other drugs for inflammation like prednisone  warfarin This list may not describe all possible interactions. Give your health care provider a list of all the medicines, herbs, non-prescription drugs, or dietary supplements you use. Also tell them if you smoke, drink alcohol, or use illegal drugs. Some items may interact with your medicine. What should I watch for while using this medicine? Tell your doctor or healthcare provider if your symptoms do not start to get better or if they get worse. This medicine may cause serious skin reactions. They can happen weeks to months after starting the medicine. Contact your healthcare provider right away if you notice fevers or flu-like symptoms with a rash. The rash may be red or purple and then turn into blisters or peeling of the skin. Or, you might notice a red rash with swelling of the face, lips or lymph nodes in your neck or under  your arms. This medicine does not prevent heart attack or stroke. In fact, this medicine may increase the chance of a heart attack or stroke. The chance may increase with longer use of this medicine and in people who have heart disease. If you take aspirin to prevent heart attack or stroke, talk with your doctor or healthcare provider. Do not take  other medicines that contain aspirin, ibuprofen, or naproxen with this medicine. Side effects such as stomach upset, nausea, or ulcers may be more likely to occur. Many medicines available without a prescription should not be taken with this medicine. This medicine can cause ulcers and bleeding in the stomach and intestines at any time during treatment. Ulcers and bleeding can happen without warning symptoms and can cause death. To reduce your risk, do not smoke cigarettes or drink alcohol while you are taking this medicine. You may get drowsy or dizzy. Do not drive, use machinery, or do anything that needs mental alertness until you know how this medicine affects you. Do not stand or sit up quickly, especially if you are an older patient. This reduces the risk of dizzy or fainting spells. This medicine can cause you to bleed more easily. Try to avoid damage to your teeth and gums when you brush or floss your teeth. This medicine may be used to treat migraines. If you take migraine medicines for 10 or more days a month, your migraines may get worse. Keep a diary of headache days and medicine use. Contact your healthcare provider if your migraine attacks occur more frequently. What side effects may I notice from receiving this medicine? Side effects that you should report to your doctor or health care professional as soon as possible:  allergic reactions like skin rash, itching or hives, swelling of the face, lips, or tongue  redness, blistering, peeling or loosening of the skin, including inside the mouth  severe stomach pain  signs and symptoms of bleeding such as bloody or black, tarry stools; red or dark-brown urine; spitting up blood or brown material that looks like coffee grounds; red spots on the skin; unusual bruising or bleeding from the eye, gums, or nose  signs and symptoms of a blood clot such as changes in vision; chest pain; severe, sudden headache; trouble speaking; sudden numbness or  weakness of the face, arm, or leg  unexplained weight gain or swelling  unusually weak or tired  yellowing of eyes or skin Side effects that usually do not require medical attention (report to your doctor or health care professional if they continue or are bothersome):  bruising  diarrhea  dizziness, drowsiness  headache  nausea, vomiting This list may not describe all possible side effects. Call your doctor for medical advice about side effects. You may report side effects to FDA at 1-800-FDA-1088. Where should I keep my medicine? Keep out of the reach of children. Store at room temperature between 15 and 30 degrees C (59 and 86 degrees F). Keep container tightly closed. Throw away any unused medicine after the expiration date. NOTE: This sheet is a summary. It may not cover all possible information. If you have questions about this medicine, talk to your doctor, pharmacist, or health care provider.  2020 Elsevier/Gold Standard (2019-02-03 14:11:00) Substance Abuse Testing Why am I having this test? Substance abuse testing is done to identify the presence of drugs in the body. You may have this test to measure the levels of certain medicines or illegal drugs in your body. Substance abuse testing  is most often used by employers and Patent examiner agencies to identify whether a person has used illegal drugs. You may also have this test if you are involved in an accident at work. What is being tested? A substance abuse test may check for:  Medicines that you have been prescribed, such as pain medicine or ADHD medicine.  Illegal drugs, such as heroin, cocaine, amphetamines, and marijuana. What kind of sample is taken?        Your health care provider may collect one or more of the following to perform the test:  A urine sample. A test sample is collected by passing urine into a clean cup.  A hair sample. This requires cutting a collection of hair from your head or body that  is about the width of a pencil. Hair may be taken from your head, chest, underarms, legs, or face.  A blood sample. This is usually collected by inserting a needle into a blood vessel. How do I prepare for this test? You may be asked to provide a list of your current prescription medicines. If the test is required for employment or legal reasons, you will be asked to give permission (consent) for the test. Before providing a sample, you may be asked to put all of your belongings in a locker or other safe location, and you may be asked to wash your hands. Follow the specific directions of the lab or department that is doing the test. Tell a health care provider about:  Any allergies you have.  All medicines you are taking, including vitamins, herbs, eye drops, creams, and over-the-counter medicines.  Any blood disorders you have.  Any surgeries you have had.  Any medical conditions you have.  Whether you are pregnant or may be pregnant. How are the results reported? Your test results will be reported as either positive or negative. What do the results mean? A negative test result means that no drugs or medicines were found in the sample that you provided. A positive test result may mean that you have recently used drugs or taken a medicine. If your result is positive, more testing will be done to confirm the presence of drugs. Talk with your health care provider or the department that is doing the test about what your results mean. Questions to ask your health care provider Ask your health care provider, or the department that is doing the test:  When will my results be ready?  How will I get my results?  What are my treatment options?  What other tests do I need?  What are my next steps? Summary  Substance abuse testing is done to identify the presence of drugs in the body.  Substance abuse testing is most often used by employers and Patent examiner agencies to identify  whether a person has used illegal drugs. You may also have this test if you are involved in an accident at work.  You may be asked to provide a list of your current prescription medicines. If the test is required for employment or legal reasons, you will be asked to give permission (consent) for the test. This information is not intended to replace advice given to you by your health care provider. Make sure you discuss any questions you have with your health care provider. Document Released: 11/18/2005 Document Revised: 02/16/2018 Document Reviewed: 08/18/2017 Elsevier Patient Education  2020 [employer] Terconazole vaginal cream What is this medicine? TERCONAZOLE (ter KON a zole) is an antifungal medicine. It  is used to treat yeast infections of the vagina. This medicine may be used for other purposes; ask your health care provider or pharmacist if you have questions. COMMON BRAND NAME(S): Terazol 3, Terazol 7, Zazole What should I tell my health care provider before I take this medicine? They need to know if you have any of these conditions:  an unusual or allergic reaction to terconazole, other antifungals, other medicines, foods, dyes or preservatives  pregnant or trying to get pregnant  breast-feeding How should I use this medicine? This medicine is only for use in the vagina. Do not take by mouth. Wash hands before and after use. Read package directions carefully before using. Use this medicine at bedtime, unless otherwise directed by your doctor or health care professional. Screw the applicator onto the end of the tube and squeeze the tube to fill the applicator. Remove the applicator from the tube. Lie on your back. Gently insert the applicator tip high in the vagina and push the plunger to release the cream into the vagina. Gently remove the applicator. Wash the applicator well with warm water and soap. Use at regular intervals. Do not get this medicine in your eyes. If you do, rinse  out with plenty of cool tap water. Finish the full course prescribed by your doctor or health care professional even if you think your condition is better. Do not stop using this medicine if your menstrual period starts during the time of treatment. Talk to your pediatrician regarding the use of this medicine in children. Special care may be needed. Overdosage: If you think you have taken too much of this medicine contact a poison control center or emergency room at once. NOTE: This medicine is only for you. Do not share this medicine with others. What if I miss a dose? If you miss a dose, use it as soon as you can. If it is almost time for your next dose, use only that dose. Do not use double or extra doses. What may interact with this medicine? Interactions are not expected. Do not use any other vaginal products without telling your doctor or health care professional. This list may not describe all possible interactions. Give your health care provider a list of all the medicines, herbs, non-prescription drugs, or dietary supplements you use. Also tell them if you smoke, drink alcohol, or use illegal drugs. Some items may interact with your medicine. What should I watch for while using this medicine? Tell your doctor or health care professional if your symptoms do not start to get better within a few days. It is better not to have sex until you have finished your treatment. If you have sex, your partner should use a condom during sex to help prevent transfer of the infection. Your sexual partner may also need treatment. Vaginal medicines usually will come out of the vagina during treatment. To keep the medicine from getting on your clothing, wear a mini-pad or sanitary napkin. The use of tampons is not recommended since they may soak up the medicine. To help clear up the infection, wear freshly washed cotton, not synthetic, underwear. What side effects may I notice from receiving this medicine? Side  effects that you should report to your doctor or health care professional as soon as possible:  painful or difficult urination  vaginal pain Side effects that usually do not require medical attention (report to your doctor or health care professional if they continue or are bothersome):  headache  menstrual pain  stomach upset  vaginal irritation, itching or burning This list may not describe all possible side effects. Call your doctor for medical advice about side effects. You may report side effects to FDA at 1-800-FDA-1088. Where should I keep my medicine? Keep out of the reach of children. Store at room temperature between 15 and 30 degrees C (59 and 86 degrees F). Throw away any unused medicine after the expiration date. NOTE: This sheet is a summary. It may not cover all possible information. If you have questions about this medicine, talk to your doctor, pharmacist, or health care provider.  2020 Elsevier/Gold Standard (2008-08-03 13:51:27) Vaginal Yeast infection, Adult  Vaginal yeast infection is a condition that causes vaginal discharge as well as soreness, swelling, and redness (inflammation) of the vagina. This is a common condition. Some women get this infection frequently. What are the causes? This condition is caused by a change in the normal balance of the yeast (candida) and bacteria that live in the vagina. This change causes an overgrowth of yeast, which causes the inflammation. What increases the risk? The condition is more likely to develop in women who:  Take antibiotic medicines.  Have diabetes.  Take birth control pills.  Are pregnant.  Douche often.  Have a weak body defense system (immune system).  Have been taking steroid medicines for a long time.  Frequently wear tight clothing. What are the signs or symptoms? Symptoms of this condition include:  White, thick, creamy vaginal discharge.  Swelling, itching, redness, and irritation of the  vagina. The lips of the vagina (vulva) may be affected as well.  Pain or a burning feeling while urinating.  Pain during sex. How is this diagnosed? This condition is diagnosed based on:  Your medical history.  A physical exam.  A pelvic exam. Your health care provider will examine a sample of your vaginal discharge under a microscope. Your health care provider may send this sample for testing to confirm the diagnosis. How is this treated? This condition is treated with medicine. Medicines may be over-the-counter or prescription. You may be told to use one or more of the following:  Medicine that is taken by mouth (orally).  Medicine that is applied as a cream (topically).  Medicine that is inserted directly into the vagina (suppository). Follow these instructions at home:  Lifestyle  Do not have sex until your health care provider approves. Tell your sex partner that you have a yeast infection. That person should go to his or her health care provider and ask if they should also be treated.  Do not wear tight clothes, such as pantyhose or tight pants.  Wear breathable cotton underwear. General instructions  Take or apply over-the-counter and prescription medicines only as told by your health care provider.  Eat more yogurt. This may help to keep your yeast infection from returning.  Do not use tampons until your health care provider approves.  Try taking a sitz bath to help with discomfort. This is a warm water bath that is taken while you are sitting down. The water should only come up to your hips and should cover your buttocks. Do this 3-4 times per day or as told by your health care provider.  Do not douche.  If you have diabetes, keep your blood sugar levels under control.  Keep all follow-up visits as told by your health care provider. This is important. Contact a health care provider if:  You have a fever.  Your symptoms go away and  then return.  Your  symptoms do not get better with treatment.  Your symptoms get worse.  You have new symptoms.  You develop blisters in or around your vagina.  You have blood coming from your vagina and it is not your menstrual period.  You develop pain in your abdomen. Summary  Vaginal yeast infection is a condition that causes discharge as well as soreness, swelling, and redness (inflammation) of the vagina.  This condition is treated with medicine. Medicines may be over-the-counter or prescription.  Take or apply over-the-counter and prescription medicines only as told by your health care provider.  Do not douche. Do not have sex or use tampons until your health care provider approves.  Contact a health care provider if your symptoms do not get better with treatment or your symptoms go away and then return. This information is not intended to replace advice given to you by your health care provider. Make sure you discuss any questions you have with your health care provider. Document Released: 08/28/2005 Document Revised: 04/06/2018 Document Reviewed: 04/06/2018 Elsevier Patient Education  2020 [employer].
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
## 2019-11-20 LAB — MED LIST OPTION NOT SELECTED

## 2019-11-22 ENCOUNTER — Telehealth: Payer: Self-pay

## 2019-11-22 LAB — PAIN MGT SCRN (14 DRUGS), UR
Amphetamine Scrn, Ur: NEGATIVE ng/mL
BARBITURATE SCREEN URINE: NEGATIVE ng/mL
BENZODIAZEPINE SCREEN, URINE: NEGATIVE ng/mL
Buprenorphine, Urine: NEGATIVE ng/mL
CANNABINOIDS UR QL SCN: NEGATIVE ng/mL
Cocaine (Metab) Scrn, Ur: NEGATIVE ng/mL
Creatinine(Crt), U: 74.1 mg/dL (ref 20.0–300.0)
Fentanyl, Urine: NEGATIVE pg/mL
Meperidine Screen, Urine: NEGATIVE ng/mL
Methadone Screen, Urine: NEGATIVE ng/mL
OXYCODONE+OXYMORPHONE UR QL SCN: POSITIVE ng/mL — AB
Opiate Scrn, Ur: POSITIVE ng/mL — AB
Ph of Urine: 5.1 (ref 4.5–8.9)
Phencyclidine Qn, Ur: NEGATIVE ng/mL
Propoxyphene Scrn, Ur: NEGATIVE ng/mL
Tramadol Screen, Urine: NEGATIVE ng/mL

## 2019-11-22 NOTE — Telephone Encounter (Signed)
Advised patient of lab results, she states that her screen was negative for Benzos because she only takes them PRN. She states that the reason she is positive for opiates and oxycodone is because she says she was still taking Oxycodone until day after her appointment which she states that she is now out of. Patient states that she has run into this problem before in the past and is requesting that we refill drug. Please advise. KW

## 2019-11-22 NOTE — Progress Notes (Signed)
Please let patient know the following :  Drug result negative for Benzodiazepines.  Positive for opiate ,and oxycodone.  Unable to prescribe any refills at this time.  She also has uncontrolled hypertension and tachycardia. Recommend follow up with her PCP to discuss ongoing  issues in the near future.  She should be  advised to seek emergency care if emergent symptoms.  _______________________________________________________________    ______________________________________________________________ ( Provider also discussed with back up physician.  Alvy Beal, MD, MPH Who is in agreement not to refill )   Message Received: Today Message Contents  Bacigalupo, Dionne Bucy, MD  Claudina Lick Kelby Aline, FNP Still negative for benzos. I would not refill given the negative results last time and evidence that she had just refilled it prior to that appointment.

## 2019-11-22 NOTE — Telephone Encounter (Signed)
-----   Message from Doreen Beam, [place] sent at 11/22/2019 10:14 AM EST ----- Please let patient know the following :  Drug result negative for Benzodiazepines.  Positive for opiate ,and oxycodone.  Unable to prescribe any refills at this time.  She also has uncontrolled hypertension and tachycardia. Recommend follow up with her PCP to discuss ongoing  issues in the near future.  She should be  advised to seek emergency care if emergent symptoms.  _______________________________________________________________    ______________________________________________________________ ( Provider also discussed with back up physician.  Alvy Beal, MD, MPH Who is in agreement not to refill )   Message Received: Today Message Contents  Bacigalupo, Dionne Bucy, MD  Claudina Lick Kelby Aline, FNP Still negative for benzos. I would not refill given the negative results last time and evidence that she had just refilled it prior to that appointment.

## 2019-11-22 NOTE — Telephone Encounter (Signed)
The issue is moreso that she was negative for opiates (oxycodone) at her last visit with PCP.  From the controlled substances database, we know that she filled the medication 2 days before that appointment and was therefore, not out of the medication.  This is a policy No controlled substances will be prescribed.

## 2019-11-29 NOTE — Telephone Encounter (Signed)
No answer at patient contact when provider attempted to contact patient.

## 2019-12-20 DIAGNOSIS — Z79899 Other long term (current) drug therapy: Secondary | ICD-10-CM | POA: Diagnosis not present

## 2020-01-12 ENCOUNTER — Emergency Department
Admission: EM | Admit: 2020-01-12 | Discharge: 2020-01-13 | Disposition: A | Discharge status: Home / Self Care | Payer: Medicare Other | Attending: Emergency Medicine | Admitting: Emergency Medicine

## 2020-01-12 ENCOUNTER — Other Ambulatory Visit: Payer: Self-pay

## 2020-01-12 ENCOUNTER — Emergency Department: Payer: Medicare Other

## 2020-01-12 DIAGNOSIS — E119 Type 2 diabetes mellitus without complications: Secondary | ICD-10-CM | POA: Insufficient documentation

## 2020-01-12 DIAGNOSIS — N12 Tubulo-interstitial nephritis, not specified as acute or chronic: Secondary | ICD-10-CM | POA: Insufficient documentation

## 2020-01-12 DIAGNOSIS — Z7984 Long term (current) use of oral hypoglycemic drugs: Secondary | ICD-10-CM | POA: Diagnosis not present

## 2020-01-12 DIAGNOSIS — Z79899 Other long term (current) drug therapy: Secondary | ICD-10-CM | POA: Diagnosis not present

## 2020-01-12 DIAGNOSIS — R000 Tachycardia, unspecified: Secondary | ICD-10-CM | POA: Diagnosis not present

## 2020-01-12 DIAGNOSIS — R109 Unspecified abdominal pain: Secondary | ICD-10-CM | POA: Diagnosis not present

## 2020-01-12 LAB — URINALYSIS, COMPLETE (UACMP) WITH MICROSCOPIC
Bilirubin Urine: NEGATIVE
Glucose, UA: >=500 mg/dL — AB
Ketones, ur: NEGATIVE mg/dL
Nitrite: POSITIVE — AB
Protein, ur: 30 mg/dL — AB
Specific Gravity, Urine: 1.007 (ref 1.005–1.030)
WBC, UA: >50 WBC/hpf — ABNORMAL HIGH (ref 0–5)
pH: 6 (ref 5.0–8.0)

## 2020-01-12 LAB — CBC
HCT: 37.5 % (ref 36.0–46.0)
Hemoglobin: 13.1 g/dL (ref 12.0–15.0)
MCH: 29.3 pg (ref 26.0–34.0)
MCHC: 34.9 g/dL (ref 30.0–36.0)
MCV: 83.9 fL (ref 80.0–100.0)
Platelets: 359 10*3/uL (ref 150–400)
RBC: 4.47 MIL/uL (ref 3.87–5.11)
RDW: 12.4 % (ref 11.5–15.5)
WBC: 11.5 10*3/uL — ABNORMAL HIGH (ref 4.0–10.5)
nRBC: 0 % (ref 0.0–0.2)

## 2020-01-12 LAB — HEPATIC FUNCTION PANEL
ALT: 29 U/L (ref 0–44)
AST: 31 U/L (ref 15–41)
Albumin: 3.7 g/dL (ref 3.5–5.0)
Alkaline Phosphatase: 109 U/L (ref 38–126)
Bilirubin, Direct: 0.2 mg/dL (ref 0.0–0.2)
Indirect Bilirubin: 0.5 mg/dL (ref 0.3–0.9)
Total Bilirubin: 0.7 mg/dL (ref 0.3–1.2)
Total Protein: 7.2 g/dL (ref 6.5–8.1)

## 2020-01-12 LAB — BASIC METABOLIC PANEL
Anion gap: 10 (ref 5–15)
BUN: 6 mg/dL (ref 6–20)
CO2: 24 mmol/L (ref 22–32)
Calcium: 9.3 mg/dL (ref 8.9–10.3)
Chloride: 100 mmol/L (ref 98–111)
Creatinine, Ser: 0.54 mg/dL (ref 0.44–1.00)
GFR calc Af Amer: >60 mL/min (ref >60)
GFR calc non Af Amer: >60 mL/min (ref >60)
Glucose, Bld: 267 mg/dL — ABNORMAL HIGH (ref 70–99)
Potassium: 3.6 mmol/L (ref 3.5–5.1)
Sodium: 134 mmol/L — ABNORMAL LOW (ref 135–145)

## 2020-01-12 LAB — LIPASE, BLOOD: Lipase: 197 U/L — ABNORMAL HIGH (ref 11–51)

## 2020-01-12 LAB — PREGNANCY, URINE: Preg Test, Ur: NEGATIVE

## 2020-01-12 MED ORDER — SODIUM CHLORIDE 0.9 % IV BOLUS
1000.0000 mL | Freq: Once | INTRAVENOUS | Status: AC
  Administered 2020-01-12: 1000 mL via INTRAVENOUS

## 2020-01-12 MED ORDER — FENTANYL CITRATE (PF) 100 MCG/2ML IJ SOLN
75.0000 ug | Freq: Once | INTRAMUSCULAR | Status: AC
  Administered 2020-01-12: 75 ug via INTRAVENOUS
  Filled 2020-01-12: qty 2

## 2020-01-12 MED ORDER — IOHEXOL 300 MG/ML  SOLN
100.0000 mL | Freq: Once | INTRAMUSCULAR | Status: AC | PRN
  Administered 2020-01-12: 100 mL via INTRAVENOUS

## 2020-01-12 MED ORDER — SULFAMETHOXAZOLE-TRIMETHOPRIM 800-160 MG PO TABS: 1.0000 | ORAL_TABLET | Freq: Two times a day (BID) | ORAL | 0 refills | Status: AC

## 2020-01-12 MED ORDER — ONDANSETRON HCL 4 MG/2ML IJ SOLN
4.0000 mg | Freq: Once | INTRAMUSCULAR | Status: AC
  Administered 2020-01-12: 4 mg via INTRAVENOUS
  Filled 2020-01-12: qty 2

## 2020-01-12 MED ORDER — FLUCONAZOLE 150 MG PO TABS: 150.0000 mg | ORAL_TABLET | Freq: Every day | ORAL | 0 refills | Status: AC

## 2020-01-12 MED ORDER — ONDANSETRON 4 MG PO TBDP: 4.0000 mg | ORAL_TABLET | Freq: Three times a day (TID) | ORAL | 0 refills | Status: DC | PRN

## 2020-01-12 MED ORDER — OXYCODONE HCL 5 MG PO TABS
5.0000 mg | ORAL_TABLET | Freq: Once | ORAL | Status: AC
  Administered 2020-01-12: 5 mg via ORAL
  Filled 2020-01-12: qty 1

## 2020-01-12 MED ORDER — SODIUM CHLORIDE 0.9 % IV SOLN
1.0000 g | Freq: Once | INTRAVENOUS | Status: AC
  Administered 2020-01-12: 1 g via INTRAVENOUS
  Filled 2020-01-12: qty 10

## 2020-01-12 MED ORDER — KETOROLAC TROMETHAMINE 30 MG/ML IJ SOLN
30.0000 mg | Freq: Once | INTRAMUSCULAR | Status: AC
  Administered 2020-01-12: 30 mg via INTRAVENOUS
  Filled 2020-01-12: qty 1
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
## 2020-01-12 NOTE — Discharge Instructions (Addendum)
We are concerned that you might have an infection of the urine that spread up to your kidneys called pyelonephritis.  We have given a dose of antibiotics he will have to start yours until tomorrow.  You should follow-up with your urine culture that will result in 3 days to make sure that it is sensitive to these antibiotics.  You should return to the ER if you develop worsening pain, fevers, nausea, vomiting or any other concerns.  Make sure you take the antibiotics to completion.  Take the fluconazole the day after you finish the antibiotics to prevent yeast infection    IMPRESSION: Mild left perinephric stranding with hyperenhancement of the left renal pelvis and ureter which could be due to inflammatory or infectious etiology. No renal calculi or hydronephrosis seen.   Mild hyperenhancement of the bladder which could be due to cystitis. Please correlate with the patient's laboratory exam.

## 2020-01-12 NOTE — ED Triage Notes (Signed)
Pt to the er for left side flank and back pain. Pt has hx of kidney stones. Pt also having chills.

## 2020-01-13 ENCOUNTER — Other Ambulatory Visit: Payer: Self-pay | Admitting: Family Medicine

## 2020-01-13 LAB — POCT PREGNANCY, URINE: Preg Test, Ur: NEGATIVE

## 2020-01-13 NOTE — ED Provider Notes (Signed)
-----------------------------------------   12:40 AM on 01/13/2020 -----------------------------------------   Blood pressure 128/78, pulse (!) 109, temperature 99.2 F (37.3 C), temperature source Oral, resp. rate 19, height 1.626 m (5\' 4" ), weight 89.8 kg, SpO2 96 %.  Patient's medications are done and pain is down to a 4/10.  No indication for further intervention tonight, and patient has someone with whom she lives to help look after her.  Discharged with Dr. AVS.   Arlee Muslim, MD 01/13/20 [id number]

## 2020-01-13 NOTE — Telephone Encounter (Signed)
Requested Prescriptions  Pending Prescriptions Disp Refills  . ONETOUCH ULTRA test strip [employer] Med Name: ONE TOUCH ULTRA BLUE TESTST(NEW)100] 100 strip     Sig: CHECK FASTING BLOOD SUGAR DAILY     Endocrinology: Diabetes - Testing Supplies Passed - 01/13/2020 10:05 AM      Passed - Valid encounter within last 12 months    Recent Outpatient Visits          1 month ago Hypertension, unspecified type   [facility] Flinchum, Eula Fried, FNP   2 months ago Diabetes mellitus without complication [facility])   [facility] Chrismon, Jodell Cipro, [place]   11 months ago Cough   [facility] Malva Limes, MD   1 year ago Diabetes mellitus without complication [facility])   [facility] Chrismon, Jodell Cipro, [place]   1 year ago Diabetes mellitus without complication [facility])   [facility] Chrismon, Jodell Cipro, [place]

## 2020-01-13 NOTE — ED Notes (Signed)
Reviewed discharge instructions, follow-up care, and prescriptions with patient. Patient verbalized understanding of all information reviewed. Patient stable, with no distress noted at this time.    

## 2020-01-14 LAB — URINE CULTURE: Culture: 90000 — AB

## 2020-01-24 ENCOUNTER — Other Ambulatory Visit: Payer: Self-pay

## 2020-01-24 ENCOUNTER — Ambulatory Visit (INDEPENDENT_AMBULATORY_CARE_PROVIDER_SITE_OTHER): Payer: Medicare Other | Admitting: Family Medicine

## 2020-01-24 VITALS — BP 120/80 | HR 106 | Temp 97.3°F | Wt 199.0 lb

## 2020-01-24 DIAGNOSIS — E119 Type 2 diabetes mellitus without complications: Secondary | ICD-10-CM

## 2020-01-24 DIAGNOSIS — G909 Disorder of the autonomic nervous system, unspecified: Secondary | ICD-10-CM

## 2020-01-24 DIAGNOSIS — N319 Neuromuscular dysfunction of bladder, unspecified: Secondary | ICD-10-CM | POA: Diagnosis not present

## 2020-01-24 DIAGNOSIS — N3000 Acute cystitis without hematuria: Secondary | ICD-10-CM | POA: Diagnosis not present

## 2020-01-24 DIAGNOSIS — R748 Abnormal levels of other serum enzymes: Secondary | ICD-10-CM

## 2020-01-24 DIAGNOSIS — G8921 Chronic pain due to trauma: Secondary | ICD-10-CM | POA: Diagnosis not present

## 2020-01-24 MED ORDER — OXYCODONE HCL 5 MG PO TABS: ORAL_TABLET | ORAL | 0 refills | Status: DC

## 2020-01-24 MED ORDER — TRAMADOL HCL 50 MG PO TABS: 50.0000 mg | ORAL_TABLET | Freq: Three times a day (TID) | ORAL | 4 refills | Status: DC | PRN
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
## 2020-01-25 ENCOUNTER — Encounter: Payer: Self-pay | Admitting: Family Medicine

## 2020-01-25 DIAGNOSIS — N319 Neuromuscular dysfunction of bladder, unspecified: Secondary | ICD-10-CM | POA: Diagnosis not present

## 2020-01-25 DIAGNOSIS — N3000 Acute cystitis without hematuria: Secondary | ICD-10-CM | POA: Diagnosis not present

## 2020-01-25 DIAGNOSIS — R748 Abnormal levels of other serum enzymes: Secondary | ICD-10-CM | POA: Diagnosis not present

## 2020-01-25 DIAGNOSIS — G909 Disorder of the autonomic nervous system, unspecified: Secondary | ICD-10-CM | POA: Diagnosis not present

## 2020-01-25 DIAGNOSIS — E119 Type 2 diabetes mellitus without complications: Secondary | ICD-10-CM | POA: Diagnosis not present

## 2020-01-26 LAB — CBC WITH DIFFERENTIAL/PLATELET
Basophils Absolute: 0 10*3/uL (ref 0.0–0.2)
Basos: 1 %
EOS (ABSOLUTE): 0.2 10*3/uL (ref 0.0–0.4)
Eos: 2 %
Hematocrit: 38.5 % (ref 34.0–46.6)
Hemoglobin: 13.4 g/dL (ref 11.1–15.9)
Immature Grans (Abs): 0 10*3/uL (ref 0.0–0.1)
Immature Granulocytes: 1 %
Lymphocytes Absolute: 2.2 10*3/uL (ref 0.7–3.1)
Lymphs: 33 %
MCH: 30.2 pg (ref 26.6–33.0)
MCHC: 34.8 g/dL (ref 31.5–35.7)
MCV: 87 fL (ref 79–97)
Monocytes Absolute: 0.3 10*3/uL (ref 0.1–0.9)
Monocytes: 5 %
Neutrophils Absolute: 3.8 10*3/uL (ref 1.4–7.0)
Neutrophils: 58 %
Platelets: 407 10*3/uL (ref 150–450)
RBC: 4.43 x10E6/uL (ref 3.77–5.28)
RDW: 13.1 % (ref 11.7–15.4)
WBC: 6.6 10*3/uL (ref 3.4–10.8)

## 2020-01-26 LAB — COMPREHENSIVE METABOLIC PANEL
ALT: 19 IU/L (ref 0–32)
AST: 18 IU/L (ref 0–40)
Albumin/Globulin Ratio: 1.7 (ref 1.2–2.2)
Albumin: 4.5 g/dL (ref 3.8–4.8)
Alkaline Phosphatase: 118 IU/L — ABNORMAL HIGH (ref 39–117)
BUN/Creatinine Ratio: 17 (ref 9–23)
BUN: 11 mg/dL (ref 6–24)
Bilirubin Total: <0.2 mg/dL (ref 0.0–1.2)
CO2: 19 mmol/L — ABNORMAL LOW (ref 20–29)
Calcium: 9.5 mg/dL (ref 8.7–10.2)
Chloride: 98 mmol/L (ref 96–106)
Creatinine, Ser: 0.63 mg/dL (ref 0.57–1.00)
GFR calc Af Amer: 124 mL/min/{1.73_m2} (ref >59)
GFR calc non Af Amer: 108 mL/min/{1.73_m2} (ref >59)
Globulin, Total: 2.7 g/dL (ref 1.5–4.5)
Glucose: 161 mg/dL — ABNORMAL HIGH (ref 65–99)
Potassium: 4.5 mmol/L (ref 3.5–5.2)
Sodium: 137 mmol/L (ref 134–144)
Total Protein: 7.2 g/dL (ref 6.0–8.5)

## 2020-01-26 LAB — URINALYSIS, ROUTINE W REFLEX MICROSCOPIC
Bilirubin, UA: NEGATIVE
Glucose, UA: NEGATIVE
Nitrite, UA: NEGATIVE
Protein,UA: NEGATIVE
Specific Gravity, UA: 1.022 (ref 1.005–1.030)
Urobilinogen, Ur: 0.2 mg/dL (ref 0.2–1.0)
pH, UA: 5.5 (ref 5.0–7.5)

## 2020-01-26 LAB — AMYLASE: Amylase: 50 U/L (ref 31–110)

## 2020-01-26 LAB — LIPID PANEL
Chol/HDL Ratio: 6.8 ratio — ABNORMAL HIGH (ref 0.0–4.4)
Cholesterol, Total: 226 mg/dL — ABNORMAL HIGH (ref 100–199)
HDL: 33 mg/dL — ABNORMAL LOW (ref >39)
LDL Chol Calc (NIH): 94 mg/dL (ref 0–99)
Triglycerides: 596 mg/dL (ref 0–149)
VLDL Cholesterol Cal: 99 mg/dL — ABNORMAL HIGH (ref 5–40)

## 2020-01-26 LAB — HEMOGLOBIN A1C
Est. average glucose Bld gHb Est-mCnc: 157 mg/dL
Hgb A1c MFr Bld: 7.1 % — ABNORMAL HIGH (ref 4.8–5.6)

## 2020-01-26 LAB — MICROSCOPIC EXAMINATION
Casts: NONE SEEN /lpf
Epithelial Cells (non renal): >10 /hpf — AB (ref 0–10)

## 2020-01-28 ENCOUNTER — Telehealth: Payer: Self-pay

## 2020-01-28 NOTE — Telephone Encounter (Signed)
-----   Message from Jodell Cipro [place], [place] sent at 01/27/2020  5:05 PM EST ----- Urinalysis showed blood in urine with some trace of leukocytes but no bacteria. Triglycerides slowly improving, but still very high. Recommend Vascepa 1 gm 2 tablets twice a day #120 and recheck lipids in a month. Blood sugar down to 161 from 267 and Hgb A1C down to 7.1 from 9.4. Continue diabetic diet and medications. No sign of pancreatitis with normal amylase and remainder of tests in good shape.

## 2020-01-28 NOTE — Telephone Encounter (Signed)
Call was unable to go through x 2

## 2020-02-03 ENCOUNTER — Other Ambulatory Visit: Payer: Self-pay | Admitting: Family Medicine

## 2020-02-03 NOTE — Telephone Encounter (Signed)
Requested Prescriptions  Pending Prescriptions Disp Refills  . glipiZIDE (GLUCOTROL) 5 MG tablet [Pharmacy Med Name: GLIPIZIDE 5MG  TABLETS] 90 tablet 0    Sig: TAKE 1 TABLET(5 MG) BY MOUTH DAILY BEFORE BREAKFAST     Endocrinology:  Diabetes - Sulfonylureas Passed - 02/03/2020 11:48 AM      Passed - HBA1C is between 0 and 7.9 and within 180 days    Hemoglobin A1C  Date Value Ref Range Status  06/17/2014 5.5  Final   Hgb A1c MFr Bld  Date Value Ref Range Status  01/25/2020 7.1 (H) 4.8 - 5.6 % Final    Comment:             Prediabetes: 5.7 - 6.4          Diabetes: >6.4          Glycemic control for adults with diabetes: <7.0          Passed - Valid encounter within last 6 months    Recent Outpatient Visits          1 week ago Acute cystitis without hematuria   [facility] [place], [place], [place]   2 months ago Hypertension, unspecified type   [place], [employer], FNP   3 months ago Diabetes mellitus without complication [facility])   [facility] Chrismon, [facility], Jodell Cipro   1 year ago Cough   [facility], MD   1 year ago Diabetes mellitus without complication [facility])   [facility] Chrismon, [facility], Jodell Cipro

## 2020-02-03 NOTE — Telephone Encounter (Signed)
Requested medication (s) are due for refill today: yes  Requested medication (s) are on the active medication list: yes  Last refill: 06/17/19  Future visit scheduled: no  Notes to clinic:  Not delegated    Requested Prescriptions  Pending Prescriptions Disp Refills   ALPRAZolam (XANAX) 0.5 MG tablet [Pharmacy Med Name: ALPRAZOLAM 0.5MG  TABLETS] 30 tablet     Sig: TAKE 1/2 TO 1 TABLET BY MOUTH EVERY DAY AS NEEDED      Not Delegated - Psychiatry:  Anxiolytics/Hypnotics Failed - 02/03/2020 11:48 AM      Failed - This refill cannot be delegated      Failed - Urine Drug Screen completed in last 360 days.      Passed - Valid encounter within last 6 months    Recent Outpatient Visits           1 week ago Acute cystitis without hematuria   [facility] Chrismon, [place], [place]   2 months ago Hypertension, unspecified type   [employer], Eula Fried, FNP   3 months ago Diabetes mellitus without complication [facility])   [facility] Chrismon, Jodell Cipro, [place]   1 year ago Cough   [facility] Malva Limes, MD   1 year ago Diabetes mellitus without complication [facility])   [facility] Chrismon, Jodell Cipro, [place]

## 2020-02-03 NOTE — Telephone Encounter (Signed)
No answer

## 2020-02-15 ENCOUNTER — Other Ambulatory Visit: Payer: Self-pay | Admitting: Family Medicine

## 2020-02-15 NOTE — Telephone Encounter (Signed)
Requested medications are due for refill today?  NON-DELEGATED MEDICATION  Requested medications are on active medication list?  Yes  Last Refill:   01/12/2020    # 20 with no refills.    Future visit scheduled?  Yes   Notes to Clinic:

## 2020-02-16 ENCOUNTER — Telehealth: Payer: Self-pay

## 2020-02-16 NOTE — Telephone Encounter (Signed)
LMTCB for Jennifer  

## 2020-02-16 NOTE — Telephone Encounter (Signed)
Insurance company would like for you to put her on a statin due to her diabetes.  If not going to be on a statin they would like to know why

## 2020-02-16 NOTE — Telephone Encounter (Signed)
Copied from CRM [id number]. Topic: General - Call Back - No Documentation >> Feb 15, 2020  5:10 PM Randol Kern wrote: [phone] [facility] - clinical call back request
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
## 2020-02-20 ENCOUNTER — Other Ambulatory Visit: Payer: Self-pay | Admitting: Family Medicine

## 2020-02-20 DIAGNOSIS — G8921 Chronic pain due to trauma: Secondary | ICD-10-CM

## 2020-02-21 MED ORDER — GLIPIZIDE 5 MG PO TABS: 5.0000 mg | ORAL_TABLET | Freq: Every day | ORAL | 1 refills | Status: DC

## 2020-02-21 MED ORDER — OXYCODONE HCL 5 MG PO TABS: ORAL_TABLET | ORAL | 0 refills | Status: DC

## 2020-02-21 NOTE — Telephone Encounter (Signed)
Please review for Dennis.    Thanks,   -Ohm Dentler  

## 2020-02-21 NOTE — Telephone Encounter (Signed)
Please review for Dennis.    Thanks,   -Rafan Sanders  

## 2020-02-23 MED ORDER — ALPRAZOLAM 0.5 MG PO TABS: 0.2500 mg | ORAL_TABLET | Freq: Every day | ORAL | 1 refills | Status: DC | PRN

## 2020-03-01 ENCOUNTER — Other Ambulatory Visit: Payer: Self-pay | Admitting: Family Medicine

## 2020-03-01 DIAGNOSIS — E119 Type 2 diabetes mellitus without complications: Secondary | ICD-10-CM

## 2020-03-01 DIAGNOSIS — G8921 Chronic pain due to trauma: Secondary | ICD-10-CM

## 2020-03-01 NOTE — Telephone Encounter (Signed)
Medication Refill - Medication: amLODipine (NORVASC) 10 MG tablet   ondansetron (ZOFRAN ODT) 4 MG disintegrating tablet  Pt wants to make sure she gets the dissolvable ones    tiZANidine (ZANAFLEX) 4 MG tablet   Has the patient contacted their pharmacy? No. (Agent: If no, request that the patient contact the pharmacy for the refill.) (Agent: If yes, when and what did the pharmacy advise?)  Preferred Pharmacy (with phone number or street name): [facility] #[id number] Nicholes Rough, [place] - [address] AT [facility] OF [address]  [address] [place], [place] [address]   Agent: Please be advised that RX refills may take up to 3 business days. We ask that you follow-up with your pharmacy.

## 2020-03-01 NOTE — Telephone Encounter (Signed)
Requested medication (s) are due for refill today: Yes  Requested medication (s) are on the active medication list: Yes  Last refill:  Zanaflex 07/06/18  Future visit scheduled: No  Notes to clinic:  Norvasc - historical provider, Zofran - not prescribed by provider, Zanaflex    Requested Prescriptions  Pending Prescriptions Disp Refills   amLODipine (NORVASC) 10 MG tablet      Sig: Take 1 tablet (10 mg total) by mouth daily.      Cardiovascular:  Calcium Channel Blockers Passed - 03/01/2020 12:27 PM      Passed - Last BP in normal range    BP Readings from Last 1 Encounters:  01/24/20 120/80          Passed - Valid encounter within last 6 months    Recent Outpatient Visits           1 month ago Acute cystitis without hematuria   [place], [place], [place]   3 months ago Hypertension, unspecified type   [employer], Kelby Aline, FNP   4 months ago Diabetes mellitus without complication [facility])   [place], Vickki Muff, [place]   1 year ago Cough   [facility] Birdie Sons, MD   1 year ago Diabetes mellitus without complication [facility])   [place], Vickki Muff, [place]                ondansetron (ZOFRAN ODT) 4 MG disintegrating tablet 20 tablet 0    Sig: Take 1 tablet (4 mg total) by mouth every 8 (eight) hours as needed for nausea or vomiting.      Not Delegated - Gastroenterology: Antiemetics Failed - 03/01/2020 12:27 PM      Failed - This refill cannot be delegated      Passed - Valid encounter within last 6 months    Recent Outpatient Visits           1 month ago Acute cystitis without hematuria   [place], [place], [place]   3 months ago Hypertension, unspecified type   [facility], Kelby Aline, FNP   4 months ago Diabetes mellitus without complication [facility])   [employer], Vickki Muff, [place]   1  year ago Cough   [facility] Birdie Sons, MD   1 year ago Diabetes mellitus without complication [facility])   [place], Vickki Muff, [place]                tiZANidine (ZANAFLEX) 4 MG tablet 120 tablet 1    Sig: TAKE 1 TABLET BY MOUTH EVERY 4 TO 6 HOURS AS NEEDED      Not Delegated - Cardiovascular:  Alpha-2 Agonists - tizanidine Failed - 03/01/2020 12:27 PM      Failed - This refill cannot be delegated      Passed - Valid encounter within last 6 months    Recent Outpatient Visits           1 month ago Acute cystitis without hematuria   [place], [place], [place]   3 months ago Hypertension, unspecified type   [employer] [facility], Kelby Aline, FNP   4 months ago Diabetes mellitus without complication [facility])   [place], [place]   1 year ago Cough   [facility] Birdie Sons, MD   1 year ago Diabetes mellitus  without complication [facility])   [facility] Chrismon, Jodell Cipro, [place]

## 2020-03-02 MED ORDER — ONDANSETRON 4 MG PO TBDP: 4.0000 mg | ORAL_TABLET | Freq: Three times a day (TID) | ORAL | 0 refills | Status: DC | PRN

## 2020-03-02 MED ORDER — AMLODIPINE BESYLATE 10 MG PO TABS: 10.0000 mg | ORAL_TABLET | Freq: Every day | ORAL | 3 refills | Status: DC

## 2020-03-02 MED ORDER — TIZANIDINE HCL 4 MG PO TABS: ORAL_TABLET | ORAL | 1 refills | Status: DC

## 2020-03-13 ENCOUNTER — Telehealth: Payer: Self-pay | Admitting: Family Medicine

## 2020-03-13 NOTE — Telephone Encounter (Signed)
Pt has been checking sugar more since it has been running high and she is out of test strip early and needs a refill/ Pharmacy asked if she can get an updated Rx with more that normal for times she checks her blood more often/ please advise    ONETOUCH ULTRA test strip   Sent to   [facility] #[id number] Nicholes Rough, [place] - [address] AT [facility] OF [address]  [address] [place], [place] [address]  Phone:  [phone] Fax:  [phone]

## 2020-03-22 ENCOUNTER — Other Ambulatory Visit: Payer: Self-pay | Admitting: Family Medicine

## 2020-03-22 DIAGNOSIS — G8921 Chronic pain due to trauma: Secondary | ICD-10-CM

## 2020-03-23 ENCOUNTER — Encounter: Payer: Self-pay | Admitting: Family Medicine

## 2020-03-23 ENCOUNTER — Other Ambulatory Visit: Payer: Self-pay | Admitting: Family Medicine

## 2020-03-23 DIAGNOSIS — E119 Type 2 diabetes mellitus without complications: Secondary | ICD-10-CM

## 2020-03-23 DIAGNOSIS — G8921 Chronic pain due to trauma: Secondary | ICD-10-CM

## 2020-03-23 MED ORDER — ONDANSETRON 4 MG PO TBDP: 4.0000 mg | ORAL_TABLET | Freq: Three times a day (TID) | ORAL | 0 refills | Status: DC | PRN

## 2020-03-23 MED ORDER — OXYCODONE HCL 5 MG PO TABS: ORAL_TABLET | ORAL | 0 refills | Status: DC

## 2020-03-23 NOTE — Telephone Encounter (Signed)
Medication: oxyCODONE (OXY IR/ROXICODONE) 5 MG immediate release tablet [[id number]] , metFORMIN (GLUCOPHAGE) 500 MG tablet [[id number]]   Has the patient contacted their pharmacy? Yes  (Agent: If no, request that the patient contact the pharmacy for the refill.) (Agent: If yes, when and what did the pharmacy advise?)  Preferred Pharmacy (with phone number or street name): [facility] #[id number] Nicholes Rough, [place] - [address] AT [facility] OF [address]  Phone:  [phone] Fax:  [phone]     Agent: Please be advised that RX refills may take up to 3 business days. We ask that you follow-up with your pharmacy.

## 2020-03-24 MED ORDER — METFORMIN HCL 500 MG PO TABS: ORAL_TABLET | ORAL | 3 refills | Status: DC

## 2020-03-24 NOTE — Telephone Encounter (Signed)
   Future visit scheduled yes  Notes to clinic Med list states 500mg  take two twice a day, last office note 2/22 states take 500mg  twice a day.

## 2020-03-24 NOTE — Telephone Encounter (Signed)
Routing to PCP office for review.

## 2020-04-11 ENCOUNTER — Telehealth: Payer: Self-pay | Admitting: Family Medicine

## 2020-04-11 ENCOUNTER — Other Ambulatory Visit: Payer: Self-pay | Admitting: Family Medicine

## 2020-04-11 NOTE — Chronic Care Management (AMB) (Signed)
  Chronic Care Management   Note  04/11/2020 Name: Aveline Daus MRN: [id number] DOB: Dec 25, 1970  France Lusty is a 49 y.o. year old female who is a primary care patient of Chrismon, Vickki Muff, [place]. I reached out to Earleen Reaper by phone today in response to a referral sent by Ms. Makhiya Guerreiro's health plan.     Ms. Rother was given information about Chronic Care Management services today including:  1. CCM service includes personalized support from designated clinical staff supervised by her physician, including individualized plan of care and coordination with other care providers 2. 24/7 contact phone numbers for assistance for urgent and routine care needs. 3. Service will only be billed when office clinical staff spend 20 minutes or more in a month to coordinate care. 4. Only one practitioner may furnish and bill the service in a calendar month. 5. The patient may stop CCM services at any time (effective at the end of the month) by phone call to the office staff. 6. The patient will be responsible for cost sharing (co-pay) of up to 20% of the service fee (after annual deductible is met).  Patient agreed to services and verbal consent obtained.   Follow up plan: Telephone appointment with care management team member scheduled for:05/05/2020  Noreene Larsson, [place], [place], [place] [address] Direct Dial: [phone] [email or web address] Website: [email or web address]

## 2020-04-20 ENCOUNTER — Other Ambulatory Visit: Payer: Self-pay | Admitting: Family Medicine

## 2020-04-20 DIAGNOSIS — G8921 Chronic pain due to trauma: Secondary | ICD-10-CM

## 2020-04-20 MED ORDER — ONDANSETRON 4 MG PO TBDP: 4.0000 mg | ORAL_TABLET | Freq: Three times a day (TID) | ORAL | 0 refills | Status: DC | PRN

## 2020-04-21 MED ORDER — OXYCODONE HCL 5 MG PO TABS: ORAL_TABLET | ORAL | 0 refills | Status: DC

## 2020-04-21 NOTE — Telephone Encounter (Signed)
Patient calling to check status of this request.  

## 2020-05-04 ENCOUNTER — Other Ambulatory Visit: Payer: Self-pay | Admitting: Family Medicine

## 2020-05-05 ENCOUNTER — Telehealth: Payer: Self-pay | Admitting: Family Medicine

## 2020-05-05 ENCOUNTER — Telehealth: Payer: Medicare Other | Admitting: Pharmacist

## 2020-05-05 ENCOUNTER — Other Ambulatory Visit: Payer: Self-pay

## 2020-05-05 NOTE — Chronic Care Management (AMB) (Signed)
  Chronic Care Management   Note  05/05/2020 Name: Alison Landry MRN: [id number] DOB: 1971-07-23  [name] is a 49 y.o. year old female who is a primary care patient of Chrismon, Vickki Muff, [place]. I reached out to Earleen Reaper by phone today in response to a referral sent by [name]'s health plan.     [name] was given information about Chronic Care Management services today including:  1. CCM service includes personalized support from designated clinical staff supervised by her physician, including individualized plan of care and coordination with other care providers 2. 24/7 contact phone numbers for assistance for urgent and routine care needs. 3. Service will only be billed when office clinical staff spend 20 minutes or more in a month to coordinate care. 4. Only one practitioner may furnish and bill the service in a calendar month. 5. The patient may stop CCM services at any time (effective at the end of the month) by phone call to the office staff. 6. The patient will be responsible for cost sharing (co-pay) of up to 20% of the service fee (after annual deductible is met).  Patient spoke with [facility] Pharmacist and decided that she was not interested in [place] services. Patient did not agree to enrollment in care management services and does not wish to consider at this time.  Follow up plan: The care management team is available to follow up with the patient after provider conversation with the patient regarding recommendation for care management engagement and subsequent re-referral to the care management team. The patient has been provided with contact information for the care management team and has been advised to call with any health related questions or concerns.   [place], [place] [address] Direct Dial: [phone] [email or web address] Website: [email or web address]

## 2020-05-10 ENCOUNTER — Other Ambulatory Visit: Payer: Self-pay | Admitting: Family Medicine

## 2020-05-10 NOTE — Telephone Encounter (Signed)
Copied from CRM [id number]. Topic: Quick Communication - Rx Refill/Question >> May 10, 2020 10:26 AM Jaquita Rector A wrote: Medication: ALPRAZolam Prudy Feeler) 0.5 MG tablet    Has the patient contacted their pharmacy? Yes.   (Agent: If no, request that the patient contact the pharmacy for the refill.) (Agent: If yes, when and what did the pharmacy advise?)  Preferred Pharmacy (with phone number or street name): [facility] #[id number] Nicholes Rough, [place] - [address] AT [facility] [address]  Phone:  [phone] Fax:  [phone]     Agent: Please be advised that RX refills may take up to 3 business days. We ask that you follow-up with your pharmacy.

## 2020-05-10 NOTE — Telephone Encounter (Signed)
Requested medication (s) are due for refill today: Yes  Requested medication (s) are on the active medication list: Yes  Last refill:  02/23/20  Future visit scheduled: No  Notes to clinic:  Attempted to call pt. To schedule an appointment. Numbers in chart have been disconnected.     Requested Prescriptions  Pending Prescriptions Disp Refills   ALPRAZolam (XANAX) 0.5 MG tablet 30 tablet 1    Sig: Take 0.5-1 tablets (0.25-0.5 mg total) by mouth daily as needed for anxiety.      Not Delegated - Psychiatry:  Anxiolytics/Hypnotics Failed - 05/10/2020 10:29 AM      Failed - This refill cannot be delegated      Failed - Urine Drug Screen completed in last 360 days.      Passed - Valid encounter within last 6 months    Recent Outpatient Visits           3 months ago Acute cystitis without hematuria   [facility] Chrismon, [place] E, [place]   5 months ago Hypertension, unspecified type   [employer], Eula Fried, FNP   6 months ago Diabetes mellitus without complication [facility])   [facility] Chrismon, Jodell Cipro, [place]   1 year ago Cough   [facility] Malva Limes, MD   1 year ago Diabetes mellitus without complication [facility])   [facility] Chrismon, Jodell Cipro, [place]

## 2020-05-11 MED ORDER — ALPRAZOLAM 0.5 MG PO TABS: 0.2500 mg | ORAL_TABLET | Freq: Every day | ORAL | 1 refills | Status: DC | PRN

## 2020-05-18 ENCOUNTER — Encounter: Payer: Self-pay | Admitting: Pharmacist

## 2020-05-19 ENCOUNTER — Other Ambulatory Visit: Payer: Self-pay | Admitting: Family Medicine

## 2020-05-19 DIAGNOSIS — E119 Type 2 diabetes mellitus without complications: Secondary | ICD-10-CM

## 2020-05-19 DIAGNOSIS — E781 Pure hyperglyceridemia: Secondary | ICD-10-CM

## 2020-05-19 MED ORDER — ATORVASTATIN CALCIUM 10 MG PO TABS: 10.0000 mg | ORAL_TABLET | Freq: Every day | ORAL | 3 refills | Status: DC

## 2020-05-22 ENCOUNTER — Other Ambulatory Visit: Payer: Self-pay | Admitting: Family Medicine

## 2020-05-22 DIAGNOSIS — G8921 Chronic pain due to trauma: Secondary | ICD-10-CM

## 2020-05-22 NOTE — Telephone Encounter (Signed)
Requested medication (s) are due for refill today: yes  Requested medication (s) are on the active medication list: yes  Last refill:  04/21/20 #135   Future visit scheduled: no  Notes to clinic:  not delegated per protocol     Requested Prescriptions  Pending Prescriptions Disp Refills   oxyCODONE (OXY IR/ROXICODONE) 5 MG immediate release tablet 135 tablet 0    Sig: Take 1 and 1/2 three times a day.      Not Delegated - Analgesics:  Opioid Agonists Failed - 05/22/2020  8:25 AM      Failed - This refill cannot be delegated      Failed - Urine Drug Screen completed in last 360 days.      Passed - Valid encounter within last 6 months    Recent Outpatient Visits           3 months ago Acute cystitis without hematuria   [facility] Chrismon, [place] E, [place]   6 months ago Hypertension, unspecified type   [employer], Eula Fried, FNP   7 months ago Diabetes mellitus without complication [facility])   [facility] Chrismon, Jodell Cipro, [place]   1 year ago Cough   [facility] Malva Limes, MD   1 year ago Diabetes mellitus without complication [facility])   [facility] Chrismon, Jodell Cipro, [place]               Refused Prescriptions Disp Refills   amLODipine (NORVASC) 10 MG tablet 90 tablet 3    Sig: Take 1 tablet (10 mg total) by mouth daily.      Cardiovascular:  Calcium Channel Blockers Passed - 05/22/2020  8:25 AM      Passed - Last BP in normal range    BP Readings from Last 1 Encounters:  01/24/20 120/80          Passed - Valid encounter within last 6 months    Recent Outpatient Visits           3 months ago Acute cystitis without hematuria   [facility], [place] E, [place]   6 months ago Hypertension, unspecified type   [employer], Eula Fried, FNP   7 months ago Diabetes mellitus without complication [facility])   [facility] Chrismon, Jodell Cipro, [place]   1 year ago Cough   [facility] Malva Limes, MD   1 year ago Diabetes mellitus without complication [facility])   [facility] Chrismon, Jodell Cipro, [place]

## 2020-05-22 NOTE — Telephone Encounter (Signed)
Refill requested too soon. Patient notified Rx refill is available at pharmacy

## 2020-05-22 NOTE — Telephone Encounter (Signed)
Medication Refill - Medication: amlodipine, oxycodone   Has the patient contacted their pharmacy? Yes.   (Agent: If no, request that the patient contact the pharmacy for the refill.) (Agent: If yes, when and what did the pharmacy advise?)  Preferred Pharmacy (with phone number or street name):  [facility] #[id number] Nicholes Rough, [place] - [address] AT [facility] [address]  [address] [place] [address]  Phone: [phone] Fax: [phone]  Hours: Not open 24 hours     Agent: Please be advised that RX refills may take up to 3 business days. We ask that you follow-up with your pharmacy.

## 2020-05-23 ENCOUNTER — Other Ambulatory Visit: Payer: Self-pay | Admitting: Family Medicine

## 2020-05-23 DIAGNOSIS — G8921 Chronic pain due to trauma: Secondary | ICD-10-CM

## 2020-05-23 MED ORDER — OXYCODONE HCL 5 MG PO TABS: ORAL_TABLET | ORAL | 0 refills | Status: DC

## 2020-05-24 ENCOUNTER — Encounter: Payer: Self-pay | Admitting: Emergency Medicine

## 2020-05-24 ENCOUNTER — Emergency Department: Payer: Medicare Other

## 2020-05-24 ENCOUNTER — Other Ambulatory Visit: Payer: Self-pay

## 2020-05-24 ENCOUNTER — Emergency Department
Admission: EM | Admit: 2020-05-24 | Discharge: 2020-05-24 | Disposition: A | Discharge status: Home / Self Care | Payer: Medicare Other | Attending: Emergency Medicine | Admitting: Emergency Medicine

## 2020-05-24 DIAGNOSIS — E119 Type 2 diabetes mellitus without complications: Secondary | ICD-10-CM | POA: Diagnosis not present

## 2020-05-24 DIAGNOSIS — R05 Cough: Secondary | ICD-10-CM | POA: Diagnosis not present

## 2020-05-24 DIAGNOSIS — J069 Acute upper respiratory infection, unspecified: Secondary | ICD-10-CM | POA: Insufficient documentation

## 2020-05-24 DIAGNOSIS — I10 Essential (primary) hypertension: Secondary | ICD-10-CM | POA: Diagnosis not present

## 2020-05-24 DIAGNOSIS — Z7984 Long term (current) use of oral hypoglycemic drugs: Secondary | ICD-10-CM | POA: Diagnosis not present

## 2020-05-24 DIAGNOSIS — Z79899 Other long term (current) drug therapy: Secondary | ICD-10-CM | POA: Insufficient documentation

## 2020-05-24 HISTORY — DX: Essential (primary) hypertension: I10

## 2020-05-24 HISTORY — DX: Type 2 diabetes mellitus without complications: E11.9

## 2020-05-24 MED ORDER — AZITHROMYCIN 500 MG PO TABS
500.0000 mg | ORAL_TABLET | Freq: Every day | ORAL | 0 refills | Status: AC
Start: 2020-05-24 — End: 2020-05-27

## 2020-05-24 MED ORDER — DOCUSATE SODIUM 50 MG/5ML PO LIQD
50.0000 mg | Freq: Every day | ORAL | Status: DC | PRN
  Administered 2020-05-24: 50 mg via OTIC
  Filled 2020-05-24 (×3): qty 10

## 2020-05-24 MED ORDER — HYDROCOD POLST-CPM POLST ER 10-8 MG/5ML PO SUER: 5.0000 mL | Freq: Two times a day (BID) | ORAL | 0 refills | Status: DC | PRN

## 2020-05-24 NOTE — ED Triage Notes (Signed)
Pt presents to ED with non-productive cough since Friday. Pt states it was productive up until today around 3 and now its just a dry cough. Slight nasal congestion. otc medications not working.
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
## 2020-06-07 ENCOUNTER — Telehealth: Payer: Self-pay

## 2020-06-07 DIAGNOSIS — Z1231 Encounter for screening mammogram for malignant neoplasm of breast: Secondary | ICD-10-CM

## 2020-06-07 NOTE — Telephone Encounter (Signed)
Copied from CRM [id number]. Topic: General - Inquiry >> Jun 07, 2020  2:42 PM Deborha Payment wrote: Reason for CRM: Patient is requesting a mammogram referral.  Patient has never had a mammogram done before and would like to get one scheduled.  Call back [phone]

## 2020-06-08 ENCOUNTER — Other Ambulatory Visit: Payer: Self-pay | Admitting: Family Medicine

## 2020-06-08 DIAGNOSIS — Z1231 Encounter for screening mammogram for malignant neoplasm of breast: Secondary | ICD-10-CM

## 2020-06-08 NOTE — Telephone Encounter (Signed)
Schedule screening mammograms.

## 2020-06-08 NOTE — Telephone Encounter (Signed)
Mammogram ordered. Patient advised. 

## 2020-06-15 ENCOUNTER — Other Ambulatory Visit: Payer: Self-pay | Admitting: Family Medicine

## 2020-06-15 MED ORDER — ONDANSETRON 4 MG PO TBDP: 4.0000 mg | ORAL_TABLET | Freq: Three times a day (TID) | ORAL | 0 refills | Status: DC | PRN

## 2020-06-19 ENCOUNTER — Other Ambulatory Visit: Payer: Self-pay | Admitting: Family Medicine

## 2020-06-19 DIAGNOSIS — G8921 Chronic pain due to trauma: Secondary | ICD-10-CM

## 2020-06-19 NOTE — Telephone Encounter (Signed)
Requested medication (s) are due for refill today: yes  Requested medication (s) are on the active medication list: yes  Last refill:  05/22/2020  Future visit scheduled: no  Notes to clinic:  this refill cannot be delegated    Requested Prescriptions  Pending Prescriptions Disp Refills   oxyCODONE (OXY IR/ROXICODONE) 5 MG immediate release tablet 135 tablet 0    Sig: Take 1 and 1/2 three times a day.      Not Delegated - Analgesics:  Opioid Agonists Failed - 06/19/2020 10:21 AM      Failed - This refill cannot be delegated      Failed - Urine Drug Screen completed in last 360 days.      Passed - Valid encounter within last 6 months    Recent Outpatient Visits           4 months ago Acute cystitis without hematuria   [facility] Chrismon, [place] E, [place]   7 months ago Hypertension, unspecified type   [employer], Eula Fried, FNP   8 months ago Diabetes mellitus without complication [facility])   [facility] Chrismon, Jodell [place], [place]   1 year ago Cough   [facility] Malva Limes, MD   1 year ago Diabetes mellitus without complication [facility])   [facility] Chrismon, Jodell [place]ipro, [place]

## 2020-06-19 NOTE — Telephone Encounter (Signed)
Medication Refill - Medication: oxycodone   Has the patient contacted their pharmacy? Yes.   (Agent: If no, request that the patient contact the pharmacy for the refill.) (Agent: If yes, when and what did the pharmacy advise?)  Preferred Pharmacy (with phone number or street name):  [facility] #[id number] - [place], [place] - [address]  [address] [place] [address]  Phone: [phone] Fax: [phone]  Hours: Not open 24 hours     Agent: Please be advised that RX refills may take up to 3 business days. We ask that you follow-up with your pharmacy.  

## 2020-06-20 ENCOUNTER — Ambulatory Visit
Admission: RE | Admit: 2020-06-20 | Discharge: 2020-06-20 | Disposition: A | Discharge status: Home / Self Care | Payer: Medicare Other | Source: Ambulatory Visit | Attending: Family Medicine | Admitting: Family Medicine

## 2020-06-20 DIAGNOSIS — Z1231 Encounter for screening mammogram for malignant neoplasm of breast: Secondary | ICD-10-CM | POA: Diagnosis not present

## 2020-06-20 MED ORDER — OXYCODONE HCL 5 MG PO TABS: ORAL_TABLET | ORAL | 0 refills | Status: DC
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
## 2020-06-26 ENCOUNTER — Other Ambulatory Visit: Payer: Self-pay

## 2020-06-26 ENCOUNTER — Ambulatory Visit (INDEPENDENT_AMBULATORY_CARE_PROVIDER_SITE_OTHER): Payer: Medicare Other

## 2020-06-26 DIAGNOSIS — Z0000 Encounter for general adult medical examination without abnormal findings: Secondary | ICD-10-CM

## 2020-06-26 NOTE — Patient Instructions (Signed)
Ms. Alison Landry , Thank you for taking time to come for your Medicare Wellness Visit. I appreciate your ongoing commitment to your health goals. Please review the following plan we discussed and let me know if I can assist you in the future.   Screening recommendations/referrals: Recommended yearly ophthalmology/optometry visit for glaucoma screening and checkup Recommended yearly dental visit for hygiene and checkup  Vaccinations: Influenza vaccine: Due fall 2021 Tdap vaccine: Currently due, declined at this time.  Advanced directives: Advance directive discussed with you today. Even though you declined this today please call our office should you change your mind and we can give you the proper paperwork for you to fill out.  Conditions/risks identified: Advised patient to schedule her yearly eye exam and diabetic foot exam.  Next appointment: None, declined scheduling a follow up with PCP or an AWV for 2022 at this time.  Preventive Care 40-64 Years, Female Preventive care refers to lifestyle choices and visits with your health care provider that can promote health and wellness. What does preventive care include?  A yearly physical exam. This is also called an annual well check.  Dental exams once or twice a year.  Routine eye exams. Ask your health care provider how often you should have your eyes checked.  Personal lifestyle choices, including:  Daily care of your teeth and gums.  Regular physical activity.  Eating a healthy diet.  Avoiding tobacco and drug use.  Limiting alcohol use.  Practicing safe sex.  Taking low-dose aspirin daily starting at age 31.  Taking vitamin and mineral supplements as recommended by your health care provider. What happens during an annual well check? The services and screenings done by your health care provider during your annual well check will depend on your age, overall health, lifestyle risk factors, and family history of  disease. Counseling  Your health care provider may ask you questions about your:  Alcohol use.  Tobacco use.  Drug use.  Emotional well-being.  Home and relationship well-being.  Sexual activity.  Eating habits.  Work and work Statistician.  Method of birth control.  Menstrual cycle.  Pregnancy history. Screening  You may have the following tests or measurements:  Height, weight, and BMI.  Blood pressure.  Lipid and cholesterol levels. These may be checked every 5 years, or more frequently if you are over 58 years old.  Skin check.  Lung cancer screening. You may have this screening every year starting at age 27 if you have a 30-pack-year history of smoking and currently smoke or have quit within the past 15 years.  Fecal occult blood test (FOBT) of the stool. You may have this test every year starting at age 34.  Flexible sigmoidoscopy or colonoscopy. You may have a sigmoidoscopy every 5 years or a colonoscopy every 10 years starting at age 82.  Hepatitis C blood test.  Hepatitis B blood test.  Sexually transmitted disease (STD) testing.  Diabetes screening. This is done by checking your blood sugar (glucose) after you have not eaten for a while (fasting). You may have this done every 1-3 years.  Mammogram. This may be done every 1-2 years. Talk to your health care provider about when you should start having regular mammograms. This may depend on whether you have a family history of breast cancer.  BRCA-related cancer screening. This may be done if you have a family history of breast, ovarian, tubal, or peritoneal cancers.  Pelvic exam and Pap test. This may be done every 3 years  starting at age 49. Starting at age 17, this may be done every 5 years if you have a Pap test in combination with an HPV test.  Bone density scan. This is done to screen for osteoporosis. You may have this scan if you are at high risk for osteoporosis. Discuss your test results,  treatment options, and if necessary, the need for more tests with your health care provider. Vaccines  Your health care provider may recommend certain vaccines, such as:  Influenza vaccine. This is recommended every year.  Tetanus, diphtheria, and acellular pertussis (Tdap, Td) vaccine. You may need a Td booster every 10 years.  Zoster vaccine. You may need this after age 13.  Pneumococcal 13-valent conjugate (PCV13) vaccine. You may need this if you have certain conditions and were not previously vaccinated.  Pneumococcal polysaccharide (PPSV23) vaccine. You may need one or two doses if you smoke cigarettes or if you have certain conditions. Talk to your health care provider about which screenings and vaccines you need and how often you need them. This information is not intended to replace advice given to you by your health care provider. Make sure you discuss any questions you have with your health care provider. Document Released: 12/15/2015 Document Revised: 08/07/2016 Document Reviewed: 09/19/2015 Elsevier Interactive Patient Education  2017 [place] Prevention in the Home Falls can cause injuries. They can happen to people of all ages. There are many things you can do to make your home safe and to help prevent falls. What can I do on the outside of my home?  Regularly fix the edges of walkways and driveways and fix any cracks.  Remove anything that might make you trip as you walk through a door, such as a raised step or threshold.  Trim any bushes or trees on the path to your home.  Use bright outdoor lighting.  Clear any walking paths of anything that might make someone trip, such as rocks or tools.  Regularly check to see if handrails are loose or broken. Make sure that both sides of any steps have handrails.  Any raised decks and porches should have guardrails on the edges.  Have any leaves, snow, or ice cleared regularly.  Use sand or salt on walking  paths during winter.  Clean up any spills in your garage right away. This includes oil or grease spills. What can I do in the bathroom?  Use night lights.  Install grab bars by the toilet and in the tub and shower. Do not use towel bars as grab bars.  Use non-skid mats or decals in the tub or shower.  If you need to sit down in the shower, use a plastic, non-slip stool.  Keep the floor dry. Clean up any water that spills on the floor as soon as it happens.  Remove soap buildup in the tub or shower regularly.  Attach bath mats securely with double-sided non-slip rug tape.  Do not have throw rugs and other things on the floor that can make you trip. What can I do in the bedroom?  Use night lights.  Make sure that you have a light by your bed that is easy to reach.  Do not use any sheets or blankets that are too big for your bed. They should not hang down onto the floor.  Have a firm chair that has side arms. You can use this for support while you get dressed.  Do not have throw rugs and  other things on the floor that can make you trip. What can I do in the kitchen?  Clean up any spills right away.  Avoid walking on wet floors.  Keep items that you use a lot in easy-to-reach places.  If you need to reach something above you, use a strong step stool that has a grab bar.  Keep electrical cords out of the way.  Do not use floor polish or wax that makes floors slippery. If you must use wax, use non-skid floor wax.  Do not have throw rugs and other things on the floor that can make you trip. What can I do with my stairs?  Do not leave any items on the stairs.  Make sure that there are handrails on both sides of the stairs and use them. Fix handrails that are broken or loose. Make sure that handrails are as long as the stairways.  Check any carpeting to make sure that it is firmly attached to the stairs. Fix any carpet that is loose or worn.  Avoid having throw rugs at  the top or bottom of the stairs. If you do have throw rugs, attach them to the floor with carpet tape.  Make sure that you have a light switch at the top of the stairs and the bottom of the stairs. If you do not have them, ask someone to add them for you. What else can I do to help prevent falls?  Wear shoes that:  Do not have high heels.  Have rubber bottoms.  Are comfortable and fit you well.  Are closed at the toe. Do not wear sandals.  If you use a stepladder:  Make sure that it is fully opened. Do not climb a closed stepladder.  Make sure that both sides of the stepladder are locked into place.  Ask someone to hold it for you, if possible.  Clearly mark and make sure that you can see:  Any grab bars or handrails.  First and last steps.  Where the edge of each step is.  Use tools that help you move around (mobility aids) if they are needed. These include:  Canes.  Walkers.  Scooters.  Crutches.  Turn on the lights when you go into a dark area. Replace any light bulbs as soon as they burn out.  Set up your furniture so you have a clear path. Avoid moving your furniture around.  If any of your floors are uneven, fix them.  If there are any pets around you, be aware of where they are.  Review your medicines with your doctor. Some medicines can make you feel dizzy. This can increase your chance of falling. Ask your doctor what other things that you can do to help prevent falls. This information is not intended to replace advice given to you by your health care provider. Make sure you discuss any questions you have with your health care provider. Document Released: 09/14/2009 Document Revised: 04/25/2016 Document Reviewed: 12/23/2014 Elsevier Interactive Patient Education  2017 [employer].

## 2020-07-04 ENCOUNTER — Other Ambulatory Visit: Payer: Self-pay | Admitting: Family Medicine

## 2020-07-04 DIAGNOSIS — E119 Type 2 diabetes mellitus without complications: Secondary | ICD-10-CM

## 2020-07-04 NOTE — Telephone Encounter (Signed)
Requested medication (s) are due for refill today: Yes  Requested medication (s) are on the active medication list: Yes  Last refill:  1 month ago  Future visit scheduled: No  Notes to clinic:  Unable to refill per protocol, cannot delegate     Requested Prescriptions  Pending Prescriptions Disp Refills   ALPRAZolam (XANAX) 0.5 MG tablet [Pharmacy Med Name: ALPRAZOLAM 0.5MG TABLETS] 30 tablet     Sig: TAKE 1/2 TO 1 TABLET BY MOUTH EVERY DAY AS NEEDED      Not Delegated - Psychiatry:  Anxiolytics/Hypnotics Failed - 07/04/2020 11:41 AM      Failed - This refill cannot be delegated      Failed - Urine Drug Screen completed in last 360 days.      Passed - Valid encounter within last 6 months    Recent Outpatient Visits           5 months ago Acute cystitis without hematuria   [place], Vickki Muff, [place]   7 months ago Hypertension, unspecified type   [facility], Kelby Aline, FNP   8 months ago Diabetes mellitus without complication [facility])   [employer], Vickki Muff, [place]   1 year ago Cough   [facility] Birdie Sons, [place]   1 year ago Diabetes mellitus without complication [facility])   [place], [place]               Signed Prescriptions Disp Refills   metFORMIN (GLUCOPHAGE) 500 MG tablet 180 tablet 1    Sig: TAKE 2 TABLETS BY MOUTH TWICE DAILY WITH A MEAL      Endocrinology:  Diabetes - Biguanides Passed - 07/04/2020 11:41 AM      Passed - Cr in normal range and within 360 days    Creatinine, Ser  Date Value Ref Range Status  01/25/2020 0.63 0.57 - 1.00 mg/dL Final   Creatinine, POC  Date Value Ref Range Status  11/21/2017 NA mg/dL Final          Passed - HBA1C is between 0 and 7.9 and within 180 days    Hemoglobin A1C  Date Value Ref Range Status  06/17/2014 5.5  Final   Hgb A1c MFr Bld  Date Value Ref Range Status  01/25/2020 7.1 (H) 4.8 - 5.6 %  Final    Comment:             Prediabetes: 5.7 - 6.4          Diabetes: >6.4          Glycemic control for adults with diabetes: <7.0           Passed - eGFR in normal range and within 360 days    GFR calc Af Amer  Date Value Ref Range Status  01/25/2020 124 >59 mL/min/1.73 Final   GFR calc non Af Amer  Date Value Ref Range Status  01/25/2020 108 >59 mL/min/1.73 Final          Passed - Valid encounter within last 6 months    Recent Outpatient Visits           5 months ago Acute cystitis without hematuria   [place], [place] E, [place]   7 months ago Hypertension, unspecified type   [facility], Kelby Aline, FNP   8 months ago Diabetes mellitus without complication [facility])   [place], Vickki Muff, [place]  1 year ago Cough   [facility] Birdie Sons, MD   1 year ago Diabetes mellitus without complication [facility])   [place], Vickki Muff, [place]

## 2020-07-09 ENCOUNTER — Other Ambulatory Visit: Payer: Self-pay | Admitting: Family Medicine

## 2020-07-09 NOTE — Telephone Encounter (Signed)
Requested Prescriptions  Pending Prescriptions Disp Refills  . ONETOUCH ULTRA test strip [employer] Med Name: ONE TOUCH ULTRA BLUE TESTST(NEW)100] 100 strip 4    Sig: CHECK FASTING BLOOD SUGAR DAILY     Endocrinology: Diabetes - Testing Supplies Passed - 07/09/2020 10:18 AM      Passed - Valid encounter within last 12 months    Recent Outpatient Visits          5 months ago Acute cystitis without hematuria   [facility] Chrism[facility], [place] E, [place]   7 months ago Hypertension, unspecified type   [employer], Eula Fried, FNP   8 months ago Diabetes mellitus without complication [facility])   [facility] Chrismon, Jodell Cipro, [place]   1 year ago Cough   [facility] Malva Limes, MD   1 year ago Diabetes mellitus without complication [facility])   [facility] Chrismon, Jodell Cipro, [place]

## 2020-07-20 ENCOUNTER — Other Ambulatory Visit: Payer: Self-pay | Admitting: Family Medicine

## 2020-07-20 DIAGNOSIS — G8921 Chronic pain due to trauma: Secondary | ICD-10-CM

## 2020-07-20 NOTE — Telephone Encounter (Signed)
Pt called called in to request a refill for oxyCODONE (OXY IR/ROXICODONE) 5 MG immediate release tablet    Pharmacy: [facility] #[id number] [place], [place] - [address] AT [facility] OF [address]  [address] [place], [place] [address]  Phone:  [phone] Fax:  [phone]

## 2020-07-20 NOTE — Telephone Encounter (Signed)
Requested medication (s) are due for refill today: yes  Requested medication (s) are on the active medication list:yes  Last refill: 06/20/20  #135  0 refills  Future visit scheduled:No  Notes to clinic:  Not delegated    Requested Prescriptions  Pending Prescriptions Disp Refills   oxyCODONE (OXY IR/ROXICODONE) 5 MG immediate release tablet 135 tablet 0    Sig: Take 1 and 1/2 three times a day.      Not Delegated - Analgesics:  Opioid Agonists Failed - 07/20/2020  4:56 PM      Failed - This refill cannot be delegated      Failed - Urine Drug Screen completed in last 360 days.      Passed - Valid encounter within last 6 months    Recent Outpatient Visits           5 months ago Acute cystitis without hematuria   [facility] Chrismon, [place] E, [place]   8 months ago Hypertension, unspecified type   [employer], Eula Fried, FNP   9 months ago Diabetes mellitus without complication [facility])   [facility] Chrismon, Jodell Cipro, [place]   1 year ago Cough   [facility] Malva Limes, MD   1 year ago Diabetes mellitus without complication [facility])   [facility] Chrismon, Jodell Cipro, [place]

## 2020-07-21 MED ORDER — OXYCODONE HCL 5 MG PO TABS: ORAL_TABLET | ORAL | 0 refills | Status: DC

## 2020-07-28 ENCOUNTER — Other Ambulatory Visit: Payer: Self-pay | Admitting: Family Medicine

## 2020-07-28 NOTE — Telephone Encounter (Signed)
Unable to leave patient voicemail because box is full, I need more clarification from patient why she needs Tussionex? How long has she had cough? Is it dry or productive? Any URI symptoms such as sneezing, congestion, ear pain/fullness, dyspnea or fever? Has she tried any otc medication to treat symptoms? Please have patient speak with Triage nurse to get further clarification. KW

## 2020-07-28 NOTE — Telephone Encounter (Signed)
Medication Refill - Medication: Tussionex  Has the patient contacted their pharmacy? No. (Agent: If no, request that the patient contact the pharmacy for the refill.) (Agent: If yes, when and what did the pharmacy advise?)  Preferred Pharmacy (with phone number or street name): [facility] #[id number] - [place], [place] - [address] AT [address] OF [address]  Agent: Please be advised that RX refills may take up to 3 business days. We ask that you follow-up with your pharmacy.

## 2020-07-29 DIAGNOSIS — E119 Type 2 diabetes mellitus without complications: Secondary | ICD-10-CM | POA: Diagnosis not present

## 2020-07-29 DIAGNOSIS — E860 Dehydration: Secondary | ICD-10-CM | POA: Diagnosis not present

## 2020-07-29 DIAGNOSIS — R0602 Shortness of breath: Secondary | ICD-10-CM | POA: Diagnosis not present

## 2020-07-29 DIAGNOSIS — R05 Cough: Secondary | ICD-10-CM | POA: Diagnosis not present

## 2020-07-29 DIAGNOSIS — U071 COVID-19: Secondary | ICD-10-CM | POA: Diagnosis not present

## 2020-07-31 ENCOUNTER — Telehealth (INDEPENDENT_AMBULATORY_CARE_PROVIDER_SITE_OTHER): Payer: Medicare Other | Admitting: Family Medicine

## 2020-07-31 ENCOUNTER — Telehealth: Payer: Self-pay | Admitting: Family Medicine

## 2020-07-31 ENCOUNTER — Other Ambulatory Visit: Payer: Self-pay | Admitting: Family Medicine

## 2020-07-31 ENCOUNTER — Telehealth: Payer: Self-pay | Admitting: *Deleted

## 2020-07-31 ENCOUNTER — Other Ambulatory Visit: Payer: Self-pay

## 2020-07-31 DIAGNOSIS — E119 Type 2 diabetes mellitus without complications: Secondary | ICD-10-CM | POA: Diagnosis not present

## 2020-07-31 DIAGNOSIS — R739 Hyperglycemia, unspecified: Secondary | ICD-10-CM

## 2020-07-31 DIAGNOSIS — R05 Cough: Secondary | ICD-10-CM | POA: Diagnosis not present

## 2020-07-31 DIAGNOSIS — U071 COVID-19: Secondary | ICD-10-CM

## 2020-07-31 DIAGNOSIS — J1282 Pneumonia due to coronavirus disease 2019: Secondary | ICD-10-CM

## 2020-07-31 DIAGNOSIS — R059 Cough, unspecified: Secondary | ICD-10-CM

## 2020-07-31 MED ORDER — ONDANSETRON 4 MG PO TBDP: 4.0000 mg | ORAL_TABLET | Freq: Three times a day (TID) | ORAL | 0 refills | Status: DC | PRN

## 2020-07-31 MED ORDER — GLIPIZIDE 10 MG PO TABS
10.0000 mg | ORAL_TABLET | Freq: Every day | ORAL | 3 refills | Status: DC
Start: 2020-07-31 — End: 2020-08-07

## 2020-07-31 MED ORDER — HYDROCODONE-HOMATROPINE 5-1.5 MG/5ML PO SYRP: 5.0000 mL | ORAL_SOLUTION | Freq: Three times a day (TID) | ORAL | 0 refills | Status: DC | PRN

## 2020-07-31 NOTE — Telephone Encounter (Deleted)
Patient reports she had a positive Covid test last week. We want to refer her to monoclonal antibody treatment, but need a copy of the positive Covid Test. Please find out where patient had test done so we can get a copy ASAP.

## 2020-07-31 NOTE — Telephone Encounter (Signed)
Spoke with [employer] pharmacist and he was advised Dr. Sherrie Mustache was aware and okay to fill the medication. He states that the system automatically does a red flag when patient is already on Oxycodone and prescribed Hycodan cough syrup.

## 2020-07-31 NOTE — Telephone Encounter (Signed)
Pharmacy called stating that they are needing to have confirmation that it is okay to fill pts cough syrup. They state that the pt is also on oxycodone and they are needing to verify. Please advise.       [facility] DR[facility]ORE #[id number] [place], [place] - [address] AT [facility] [address]  [address] [place] [address]  Phone: [phone] Fax: [phone]  Hours: Not open 24 hours

## 2020-07-31 NOTE — Telephone Encounter (Signed)
Patient had virtual visit today.

## 2020-07-31 NOTE — Telephone Encounter (Signed)
Copied from CRM [id number]. Topic: Quick Communication - See Telephone Encounter >> Jul 31, 2020  4:39 PM Aretta Nip wrote: CRM for notification. See Telephone encounter for: 07/31/20. PT is trying to get in contact with Dr Sherrie Mustache Had video visit today and is needing a form faxed on a covid infusion site in [place], States her form expires and she only has a certain time frame she is allow for the treatment. FU [id number] States been trying to get someone to help her all day .

## 2020-07-31 NOTE — Telephone Encounter (Signed)
Yes, I am aware, OK to fill

## 2020-07-31 NOTE — Telephone Encounter (Signed)
Copied from CRM [id number]. Topic: General - Other >> Jul 31, 2020  2:38 PM Dalphine Handing A wrote: Patient stated that she has been waiting on a callback from Dr .Esmond Camper nurse and would like a callback as soon as possible. Please advise

## 2020-07-31 NOTE — Progress Notes (Addendum)
MyChart Video Visit    Virtual Visit via Video Note   This visit type was conducted due to national recommendations for restrictions regarding the COVID-19 Pandemic (e.g. social distancing) in an effort to limit this patient's exposure and mitigate transmission in our community. This patient is at least at moderate risk for complications without adequate follow up. This format is felt to be most appropriate for this patient at this time. Physical exam was limited by quality of the video and audio technology used for the visit.   Patient location: Her daughters home in [place] Provider location: [facility]  I discussed the limitations of evaluation and management by telemedicine and the availability of in person appointments. The patient expressed understanding and agreed to proceed.   Patient: Alison Landry   DOB: 05/10/1971   49 y.o. Female  MRN: [id number] Visit Date: 07/31/2020  Today's healthcare provider: Lelon Huh, MD   Chief Complaint  Patient presents with  . Cough   Subjective    Cough This is a new problem. The current episode started 1 to 4 weeks ago. The problem has been unchanged. The cough is productive of sputum. Associated symptoms include a fever (101.0-102.0 per patient), headaches, a sore throat, shortness of breath and wheezing. Pertinent negatives include no chills, nasal congestion, postnasal drip or rhinorrhea. She has tried rest, prescription cough suppressant and a beta-agonist inhaler for the symptoms. The treatment provided mild relief.   Tested positive for COVID on 07/24/20 and is currently quaranting at daughters house in [place]. Patient reports that she went to the ER on 07-29-2020 and they prescribed inhaler and Dexamethasone and they are currently not working. Is still short of breath. States her oxygen levels are around 92. States her blood sugar at the hospital was around 500, but she doesn't have her meter with her.  She states she was given contact  information for IV treatments which I presume is monoclonal antibodies, but she has not been able to get in touch with them yet.       Medications: Outpatient Medications Prior to Visit  Medication Sig  . ALPRAZolam (XANAX) 0.5 MG tablet TAKE 1/2 TO 1 TABLET BY MOUTH EVERY DAY AS NEEDED  . amLODipine (NORVASC) 10 MG tablet Take 1 tablet (10 mg total) by mouth daily.  Marland Kitchen atorvastatin (LIPITOR) 10 MG tablet Take 1 tablet (10 mg total) by mouth daily.  . blood glucose meter kit and supplies KIT Dispense based on patient and insurance preference. Use up to four times daily as directed. (FOR ICD-9 250.00, 250.01).  . cyclobenzaprine (FLEXERIL) 10 MG tablet Take by mouth.   . gabapentin (NEURONTIN) 600 MG tablet TAKE 1 TABLET(600 MG) BY MOUTH THREE TIMES DAILY  . glipiZIDE (GLUCOTROL) 5 MG tablet Take 1 tablet (5 mg total) by mouth daily before breakfast.  . Lancets (ONETOUCH DELICA PLUS [id number]) MISC CHECK FASTING BLOOD SUGAR ONCE DAILY AS DIRECTED  . metFORMIN (GLUCOPHAGE) 500 MG tablet TAKE 2 TABLETS BY MOUTH TWICE DAILY WITH A MEAL  . ondansetron (ZOFRAN ODT) 4 MG disintegrating tablet Take 1 tablet (4 mg total) by mouth every 8 (eight) hours as needed for nausea or vomiting.  Glory Rosebush ULTRA test strip CHECK FASTING BLOOD SUGAR DAILY  . oxyCODONE (OXY IR/ROXICODONE) 5 MG immediate release tablet Take 1 and 1/2 three times a day.  . sertraline (ZOLOFT) 100 MG tablet Take 1 tablet (100 mg total) by mouth daily.  Marland Kitchen terconazole (TERAZOL 3) 0.8 % vaginal cream Place 1  applicator vaginally at bedtime.  Marland Kitchen tiZANidine (ZANAFLEX) 4 MG tablet TAKE 1 TABLET BY MOUTH EVERY 4 TO 6 HOURS AS NEEDED  . traMADol (ULTRAM) 50 MG tablet Take 1 tablet (50 mg total) by mouth 3 (three) times daily as needed.  . chlorpheniramine-HYDROcodone (TUSSIONEX PENNKINETIC ER) 10-8 MG/5ML SUER Take 5 mLs by mouth every 12 (twelve) hours as needed. (Patient not taking: Reported on 06/26/2020)  . fluconazole (DIFLUCAN) 150 MG  tablet Take 150 mg by mouth once. (Patient not taking: Reported on 06/26/2020)  . oxyCODONE (OXY IR/ROXICODONE) 5 MG immediate release tablet Take 1 and 1/2 three times a day. (Patient not taking: Reported on 06/26/2020)   No facility-administered medications prior to visit.    Review of Systems  Constitutional: Positive for fatigue and fever (101.0-102.0 per patient). Negative for chills.  HENT: Positive for congestion, sinus pressure, sinus pain and sore throat. Negative for postnasal drip, rhinorrhea and sneezing.   Respiratory: Positive for cough, chest tightness, shortness of breath and wheezing.   Neurological: Positive for weakness and headaches.      Objective    There were no vitals taken for this visit.   Physical Exam   Awake, alert, oriented x 3. In no apparent distress, but coughing frequently. RR appears to be around 14-16.    Assessment & Plan     1. Cough  - HYDROcodone-homatropine (HYCODAN) 5-1.5 MG/5ML syrup; Take 5 mLs by mouth every 8 (eight) hours as needed.  Dispense: 100 mL; Refill: 0  2. Pneumonia due to COVID-19 virus She is checking o2 saturations and advised to back to ER if drops to 90 or lower. Strongly counseled to follow up with her contact information for monoclonal antibodies and that treatment needs to be done within 10 days of onset of symptoms.   3. Hyperglycemia   4. Diabetes mellitus without complication ([place]) Counseled regarding affects of Covid on blood sugars. She is to double her glipizide to 3m daily . [place] to ER if her sugars are are over 500.       I discussed the assessment and treatment plan with the patient. The patient was provided an opportunity to ask questions and all were answered. The patient agreed with the plan and demonstrated an understanding of the instructions.   The patient was advised to call back or seek an in-person evaluation if the symptoms  worsen or if the condition fails to improve as anticipated.  I provided 15 minutes of non-face-to-face time during this encounter.  The entirety of the information documented in the History of Present Illness, Review of Systems and Physical Exam were personally obtained by me. Portions of this information were initially documented by the CMA and reviewed by me for thoroughness and accuracy.     DLelon Huh MD [phone](phone) [phone](fax)  [place]

## 2020-07-31 NOTE — Telephone Encounter (Signed)
This could be an indication of kidney failure from covid or a blood clotting disorder from covid. She needs to go to ER.

## 2020-07-31 NOTE — Telephone Encounter (Signed)
Copied from CRM [id number]. Topic: General - Inquiry >> Jul 31, 2020 12:44 PM Alison Landry D wrote: Pt called saying she had an appt with Dr. Sherrie Mustache this morning for Covid pneumonia but forgot to tell him that she is seeing some blood in her urine.  She said she went to the ER in [place] this weekend.  She had a urine test done and said she had keytones and a few other things in her UA but she did not physically see blood then in her urine.  [phone]

## 2020-07-31 NOTE — Telephone Encounter (Signed)
Pt states she is covid pos and having a hard time.  I have scheduled her a virtual visit with Dr Sherrie Mustache today at 10:40.

## 2020-07-31 NOTE — Telephone Encounter (Signed)
Patient advised as below. Patient verbalized understanding and agrees to go to ER. Patient wants to know if Dr. Sherrie Mustache would still fill out the form for her to have an antibodies infusion. I advised patient that we needed a copy of her positive COVID test results. Patient states she will try faxing over a copy of the test results today if the ER is not able to get her set up for the infusion.

## 2020-07-31 NOTE — Telephone Encounter (Signed)
Patient called earlier today reporting that she has had blood in her urine. Er Dr. Sherrie Mustache, patient needs to go back to the ER for evaluation. Patient verbalized understanding and agrees to go to ER. Patient wants to know if Dr. Sherrie Mustache would still fill out the form for her to have an antibodies infusion. I advised patient that we needed a copy of her positive COVID test results. Patient states she will try faxing over a copy of the test results today if the ER is not able to get her set up for the infusion.

## 2020-08-01 ENCOUNTER — Telehealth: Payer: Self-pay

## 2020-08-01 NOTE — Telephone Encounter (Signed)
Patient was advised via another message. 

## 2020-08-01 NOTE — Telephone Encounter (Signed)
We received a copy of the notes from the mobile clinic that documented patient has a positive rapid COVID test. Antibody infusion paperwork has been completed by Dr. Sherrie Mustache and faxed by Jacqulynn Cadet in medial records. I called and advised patient of this. I also asked patient whether she went to the ER last night. She told me that she did not go, but she plans on going to the ER today.

## 2020-08-01 NOTE — Telephone Encounter (Signed)
Patient called to check if fax was received for her to have infusion therapy.

## 2020-08-02 ENCOUNTER — Other Ambulatory Visit: Payer: Self-pay | Admitting: Family Medicine

## 2020-08-02 NOTE — Telephone Encounter (Signed)
Requested medication (s) are due for refill today: yes   Requested medication (s) are on the active medication list: yes   Last refill:  06/09/2020  Future visit scheduled: no  Notes to clinic:  this refill cannot be delegated    Requested Prescriptions  Pending Prescriptions Disp Refills   ALPRAZolam (XANAX) 0.5 MG tablet [Pharmacy Med Name: ALPRAZOLAM 0.5MG  TABLETS] 30 tablet     Sig: TAKE 1/2 TO 1 TABLET(0.25 TO 0.5 MG) BY MOUTH DAILY AS NEEDED FOR ANXIETY      Not Delegated - Psychiatry:  Anxiolytics/Hypnotics Failed - 08/02/2020  3:05 PM      Failed - This refill cannot be delegated      Failed - Urine Drug Screen completed in last 360 days.      Passed - Valid encounter within last 6 months    Recent Outpatient Visits           2 days ago Pneumonia due to COVID-19 virus   [facility] Malva Limes, [place]   6 months ago Acute cystitis without hematuria   [facility] Chrismon, [place], [place]   8 months ago Hypertension, unspecified type   [facility] [place], Eula Fried, FNP   9 months ago Diabetes mellitus without complication [facility])   [facility] Chrismon, Jodell Cipro, [place]   1 year ago Cough   [facility] Malva Limes, MD

## 2020-08-04 ENCOUNTER — Inpatient Hospital Stay
Admission: EM | Admit: 2020-08-04 | Discharge: 2020-08-07 | DRG: 177 | Disposition: A | Discharge status: Home / Self Care | Payer: Medicare Other | Attending: Internal Medicine | Admitting: Internal Medicine

## 2020-08-04 ENCOUNTER — Encounter: Payer: Self-pay | Admitting: Emergency Medicine

## 2020-08-04 ENCOUNTER — Other Ambulatory Visit: Payer: Self-pay

## 2020-08-04 ENCOUNTER — Emergency Department: Payer: Medicare Other

## 2020-08-04 ENCOUNTER — Ambulatory Visit: Payer: Self-pay | Admitting: Family Medicine

## 2020-08-04 DIAGNOSIS — Z79899 Other long term (current) drug therapy: Secondary | ICD-10-CM

## 2020-08-04 DIAGNOSIS — U071 COVID-19: Principal | ICD-10-CM | POA: Diagnosis present

## 2020-08-04 DIAGNOSIS — E785 Hyperlipidemia, unspecified: Secondary | ICD-10-CM

## 2020-08-04 DIAGNOSIS — I10 Essential (primary) hypertension: Secondary | ICD-10-CM | POA: Diagnosis present

## 2020-08-04 DIAGNOSIS — J1282 Pneumonia due to coronavirus disease 2019: Secondary | ICD-10-CM | POA: Diagnosis present

## 2020-08-04 DIAGNOSIS — G8929 Other chronic pain: Secondary | ICD-10-CM | POA: Diagnosis not present

## 2020-08-04 DIAGNOSIS — R000 Tachycardia, unspecified: Secondary | ICD-10-CM | POA: Diagnosis not present

## 2020-08-04 DIAGNOSIS — Z888 Allergy status to other drugs, medicaments and biological substances status: Secondary | ICD-10-CM | POA: Diagnosis not present

## 2020-08-04 DIAGNOSIS — Z8249 Family history of ischemic heart disease and other diseases of the circulatory system: Secondary | ICD-10-CM

## 2020-08-04 DIAGNOSIS — E1165 Type 2 diabetes mellitus with hyperglycemia: Secondary | ICD-10-CM | POA: Diagnosis not present

## 2020-08-04 DIAGNOSIS — N939 Abnormal uterine and vaginal bleeding, unspecified: Secondary | ICD-10-CM

## 2020-08-04 DIAGNOSIS — N319 Neuromuscular dysfunction of bladder, unspecified: Secondary | ICD-10-CM | POA: Diagnosis present

## 2020-08-04 DIAGNOSIS — J9801 Acute bronchospasm: Secondary | ICD-10-CM | POA: Diagnosis present

## 2020-08-04 DIAGNOSIS — J9600 Acute respiratory failure, unspecified whether with hypoxia or hypercapnia: Secondary | ICD-10-CM | POA: Diagnosis not present

## 2020-08-04 DIAGNOSIS — F329 Major depressive disorder, single episode, unspecified: Secondary | ICD-10-CM | POA: Diagnosis present

## 2020-08-04 DIAGNOSIS — Z9049 Acquired absence of other specified parts of digestive tract: Secondary | ICD-10-CM | POA: Diagnosis not present

## 2020-08-04 DIAGNOSIS — M549 Dorsalgia, unspecified: Secondary | ICD-10-CM | POA: Diagnosis present

## 2020-08-04 DIAGNOSIS — Z7984 Long term (current) use of oral hypoglycemic drugs: Secondary | ICD-10-CM

## 2020-08-04 DIAGNOSIS — E1169 Type 2 diabetes mellitus with other specified complication: Secondary | ICD-10-CM

## 2020-08-04 DIAGNOSIS — J9601 Acute respiratory failure with hypoxia: Secondary | ICD-10-CM | POA: Diagnosis present

## 2020-08-04 HISTORY — DX: Pneumonia due to coronavirus disease 2019: J12.82

## 2020-08-04 HISTORY — DX: COVID-19: U07.1

## 2020-08-04 LAB — CBC
HCT: 37.9 % (ref 36.0–46.0)
Hemoglobin: 13.3 g/dL (ref 12.0–15.0)
MCH: 29.2 pg (ref 26.0–34.0)
MCHC: 35.1 g/dL (ref 30.0–36.0)
MCV: 83.1 fL (ref 80.0–100.0)
Platelets: 433 K/uL — ABNORMAL HIGH (ref 150–400)
RBC: 4.56 MIL/uL (ref 3.87–5.11)
RDW: 11.9 % (ref 11.5–15.5)
WBC: 8.9 K/uL (ref 4.0–10.5)
nRBC: 0 % (ref 0.0–0.2)

## 2020-08-04 LAB — LACTATE DEHYDROGENASE: LDH: 211 U/L — ABNORMAL HIGH (ref 98–192)

## 2020-08-04 LAB — GLUCOSE, CAPILLARY
Glucose-Capillary: 294 mg/dL — ABNORMAL HIGH (ref 70–99)
Glucose-Capillary: 339 mg/dL — ABNORMAL HIGH (ref 70–99)
Glucose-Capillary: 283 mg/dL — ABNORMAL HIGH (ref 70–99)
Glucose-Capillary: 359 mg/dL — ABNORMAL HIGH (ref 70–99)

## 2020-08-04 LAB — CBC WITH DIFFERENTIAL/PLATELET
Abs Immature Granulocytes: 0.33 10*3/uL — ABNORMAL HIGH (ref 0.00–0.07)
Basophils Absolute: 0 10*3/uL (ref 0.0–0.1)
Basophils Relative: 1 %
Eosinophils Absolute: 0 10*3/uL (ref 0.0–0.5)
Eosinophils Relative: 0 %
HCT: 39.8 % (ref 36.0–46.0)
Hemoglobin: 14.1 g/dL (ref 12.0–15.0)
Immature Granulocytes: 4 %
Lymphocytes Relative: 24 %
Lymphs Abs: 1.9 10*3/uL (ref 0.7–4.0)
MCH: 29.3 pg (ref 26.0–34.0)
MCHC: 35.4 g/dL (ref 30.0–36.0)
MCV: 82.7 fL (ref 80.0–100.0)
Monocytes Absolute: 0.5 10*3/uL (ref 0.1–1.0)
Monocytes Relative: 6 %
Neutro Abs: 4.9 10*3/uL (ref 1.7–7.7)
Neutrophils Relative %: 65 %
Platelets: 433 10*3/uL — ABNORMAL HIGH (ref 150–400)
RBC: 4.81 MIL/uL (ref 3.87–5.11)
RDW: 11.9 % (ref 11.5–15.5)
WBC: 7.6 10*3/uL (ref 4.0–10.5)
nRBC: 0 % (ref 0.0–0.2)

## 2020-08-04 LAB — COMPREHENSIVE METABOLIC PANEL
ALT: 39 U/L (ref 0–44)
AST: 30 U/L (ref 15–41)
Albumin: 3.5 g/dL (ref 3.5–5.0)
Alkaline Phosphatase: 119 U/L (ref 38–126)
Anion gap: 15 (ref 5–15)
BUN: 14 mg/dL (ref 6–20)
CO2: 24 mmol/L (ref 22–32)
Calcium: 9.6 mg/dL (ref 8.9–10.3)
Chloride: 97 mmol/L — ABNORMAL LOW (ref 98–111)
Creatinine, Ser: 0.83 mg/dL (ref 0.44–1.00)
GFR calc Af Amer: >60 mL/min (ref >60)
GFR calc non Af Amer: >60 mL/min (ref >60)
Glucose, Bld: 283 mg/dL — ABNORMAL HIGH (ref 70–99)
Potassium: 3.6 mmol/L (ref 3.5–5.1)
Sodium: 136 mmol/L (ref 135–145)
Total Bilirubin: 0.9 mg/dL (ref 0.3–1.2)
Total Protein: 7.5 g/dL (ref 6.5–8.1)

## 2020-08-04 LAB — C-REACTIVE PROTEIN: CRP: 3.7 mg/dL — ABNORMAL HIGH (ref <1.0)

## 2020-08-04 LAB — CREATININE, SERUM
Creatinine, Ser: 0.57 mg/dL (ref 0.44–1.00)
GFR calc Af Amer: >60 mL/min (ref >60)
GFR calc non Af Amer: >60 mL/min (ref >60)

## 2020-08-04 LAB — FIBRINOGEN: Fibrinogen: 536 mg/dL — ABNORMAL HIGH (ref 210–475)

## 2020-08-04 LAB — FERRITIN: Ferritin: 148 ng/mL (ref 11–307)

## 2020-08-04 LAB — FIBRIN DERIVATIVES D-DIMER (ARMC ONLY): Fibrin derivatives D-dimer (ARMC): 571.33 ng/mL (FEU) — ABNORMAL HIGH (ref 0.00–499.00)

## 2020-08-04 LAB — TROPONIN I (HIGH SENSITIVITY): Troponin I (High Sensitivity): 13 ng/L (ref <18)

## 2020-08-04 LAB — PROCALCITONIN: Procalcitonin: <0.1 ng/mL

## 2020-08-04 LAB — HIV ANTIBODY (ROUTINE TESTING W REFLEX): HIV Screen 4th Generation wRfx: NONREACTIVE

## 2020-08-04 LAB — SARS CORONAVIRUS 2 BY RT PCR (HOSPITAL ORDER, PERFORMED IN ~~LOC~~ HOSPITAL LAB): SARS Coronavirus 2: POSITIVE — AB

## 2020-08-04 MED ORDER — DOCUSATE SODIUM 100 MG PO CAPS
100.0000 mg | ORAL_CAPSULE | Freq: Two times a day (BID) | ORAL | Status: DC
  Administered 2020-08-04 – 2020-08-07 (×4): 100 mg via ORAL
  Filled 2020-08-04 (×7): qty 1

## 2020-08-04 MED ORDER — ASCORBIC ACID 500 MG PO TABS
500.0000 mg | ORAL_TABLET | Freq: Every day | ORAL | Status: DC
  Administered 2020-08-04 – 2020-08-07 (×4): 500 mg via ORAL
  Filled 2020-08-04 (×4): qty 1

## 2020-08-04 MED ORDER — ENOXAPARIN SODIUM 40 MG/0.4ML ~~LOC~~ SOLN
40.0000 mg | SUBCUTANEOUS | Status: DC
  Administered 2020-08-04: 40 mg via SUBCUTANEOUS
  Filled 2020-08-04: qty 0.4

## 2020-08-04 MED ORDER — SENNOSIDES-DOCUSATE SODIUM 8.6-50 MG PO TABS: 1.0000 | ORAL_TABLET | Freq: Every evening | ORAL | Status: DC | PRN

## 2020-08-04 MED ORDER — INSULIN DETEMIR 100 UNIT/ML ~~LOC~~ SOLN
0.1000 [IU]/kg | Freq: Two times a day (BID) | SUBCUTANEOUS | Status: DC
  Administered 2020-08-04 – 2020-08-05 (×3): 9 [IU] via SUBCUTANEOUS
  Filled 2020-08-04 (×6): qty 0.09

## 2020-08-04 MED ORDER — SODIUM CHLORIDE 0.9 % IV SOLN
200.0000 mg | Freq: Once | INTRAVENOUS | Status: AC
  Administered 2020-08-04: 200 mg via INTRAVENOUS
  Filled 2020-08-04: qty 200

## 2020-08-04 MED ORDER — ONDANSETRON 4 MG PO TBDP: 4.0000 mg | ORAL_TABLET | Freq: Three times a day (TID) | ORAL | Status: DC | PRN

## 2020-08-04 MED ORDER — ONDANSETRON HCL 4 MG/2ML IJ SOLN: 4.0000 mg | Freq: Four times a day (QID) | INTRAMUSCULAR | Status: DC | PRN

## 2020-08-04 MED ORDER — ONDANSETRON HCL 4 MG PO TABS: 4.0000 mg | ORAL_TABLET | Freq: Four times a day (QID) | ORAL | Status: DC | PRN

## 2020-08-04 MED ORDER — ATORVASTATIN CALCIUM 20 MG PO TABS
10.0000 mg | ORAL_TABLET | Freq: Every day | ORAL | Status: DC
  Administered 2020-08-05 – 2020-08-06 (×2): 10 mg via ORAL
  Filled 2020-08-04 (×3): qty 1

## 2020-08-04 MED ORDER — SODIUM CHLORIDE 0.9 % IV SOLN
100.0000 mg | Freq: Every day | INTRAVENOUS | Status: DC
  Administered 2020-08-05 – 2020-08-07 (×3): 100 mg via INTRAVENOUS
  Filled 2020-08-04 (×2): qty 20
  Filled 2020-08-04: qty 100
  Filled 2020-08-04: qty 20

## 2020-08-04 MED ORDER — ACETAMINOPHEN 325 MG PO TABS: 650.0000 mg | ORAL_TABLET | Freq: Four times a day (QID) | ORAL | Status: DC | PRN

## 2020-08-04 MED ORDER — GUAIFENESIN-DM 100-10 MG/5ML PO SYRP
10.0000 mL | ORAL_SOLUTION | ORAL | Status: DC | PRN
  Administered 2020-08-04 – 2020-08-06 (×3): 10 mL via ORAL
  Filled 2020-08-04 (×4): qty 10

## 2020-08-04 MED ORDER — LINAGLIPTIN 5 MG PO TABS
5.0000 mg | ORAL_TABLET | Freq: Every day | ORAL | Status: DC
  Administered 2020-08-04 – 2020-08-07 (×4): 5 mg via ORAL
  Filled 2020-08-04 (×5): qty 1

## 2020-08-04 MED ORDER — DEXAMETHASONE SODIUM PHOSPHATE 10 MG/ML IJ SOLN
10.0000 mg | Freq: Once | INTRAMUSCULAR | Status: AC
  Administered 2020-08-04: 10 mg via INTRAVENOUS
  Filled 2020-08-04: qty 1

## 2020-08-04 MED ORDER — ALBUTEROL SULFATE HFA 108 (90 BASE) MCG/ACT IN AERS
2.0000 | INHALATION_SPRAY | Freq: Four times a day (QID) | RESPIRATORY_TRACT | Status: DC
  Administered 2020-08-04 – 2020-08-07 (×12): 2 via RESPIRATORY_TRACT
  Filled 2020-08-04 (×2): qty 6.7

## 2020-08-04 MED ORDER — AMLODIPINE BESYLATE 10 MG PO TABS
10.0000 mg | ORAL_TABLET | Freq: Every day | ORAL | Status: DC
  Administered 2020-08-05 – 2020-08-07 (×3): 10 mg via ORAL
  Filled 2020-08-04 (×3): qty 1

## 2020-08-04 MED ORDER — DEXAMETHASONE SODIUM PHOSPHATE 10 MG/ML IJ SOLN: 6.0000 mg | INTRAMUSCULAR | Status: DC

## 2020-08-04 MED ORDER — INSULIN ASPART 100 UNIT/ML ~~LOC~~ SOLN
0.0000 [IU] | SUBCUTANEOUS | Status: DC
  Administered 2020-08-04: 8 [IU] via SUBCUTANEOUS
  Administered 2020-08-04 – 2020-08-05 (×2): 15 [IU] via SUBCUTANEOUS
  Administered 2020-08-05: 8 [IU] via SUBCUTANEOUS
  Administered 2020-08-05: 5 [IU] via SUBCUTANEOUS
  Administered 2020-08-05: 15 [IU] via SUBCUTANEOUS
  Administered 2020-08-05: 10:00:00 11 [IU] via SUBCUTANEOUS
  Administered 2020-08-05: 13:00:00 15 [IU] via SUBCUTANEOUS
  Administered 2020-08-06 (×2): 11 [IU] via SUBCUTANEOUS
  Administered 2020-08-06: 13:00:00 15 [IU] via SUBCUTANEOUS
  Administered 2020-08-06: 11 [IU] via SUBCUTANEOUS
  Filled 2020-08-04 (×12): qty 1

## 2020-08-04 MED ORDER — BISACODYL 5 MG PO TBEC: 5.0000 mg | DELAYED_RELEASE_TABLET | Freq: Every day | ORAL | Status: DC | PRN

## 2020-08-04 MED ORDER — SERTRALINE HCL 50 MG PO TABS: 100.0000 mg | ORAL_TABLET | Freq: Every day | ORAL | Status: DC

## 2020-08-04 MED ORDER — LACTATED RINGERS IV BOLUS
1000.0000 mL | Freq: Once | INTRAVENOUS | Status: AC
  Administered 2020-08-04: 1000 mL via INTRAVENOUS

## 2020-08-04 MED ORDER — ZINC SULFATE 220 (50 ZN) MG PO CAPS
220.0000 mg | ORAL_CAPSULE | Freq: Every day | ORAL | Status: DC
  Administered 2020-08-04 – 2020-08-07 (×4): 220 mg via ORAL
  Filled 2020-08-04 (×4): qty 1

## 2020-08-04 NOTE — Telephone Encounter (Signed)
Patient called stating that she has been in the hospital ER since midnight last night.  She states that she has been confirmed with COVID-19 on 8/23.  On Sunday she was Dx with COVID-19 pneumonia while in [place]. She states that she has had Xray and blood work done while waiting in the waiting room  They have recently been out to check her O2 level.  She reports that it is 91%.  She would like to leave the ER and requesting that Dr Sherrie Mustache order her nebulizer tx.  Per protocol patient was advised to remain at the ER. Continue to inquire about treatment plan for herself. She agree's to stay for now. Please advise patient.   Reason for Disposition . MODERATE difficulty breathing (e.g., speaks in phrases, SOB even at rest, pulse 100-120)  Answer Assessment - Initial Assessment Questions 1. COVID-19 DIAGNOSIS: "Who made your Coronavirus (COVID-19) diagnosis?" "Was it confirmed by a positive lab test?" If not diagnosed by a HCP, ask "Are there lots of cases (community spread) where you live?" (See public health department website, if unsure)    23rd  And Sunday Dx COVID pneumonia 2. COVID-19 EXPOSURE: "Was there any known exposure to COVID before the symptoms began?" CDC Definition of close contact: within 6 feet (2 meters) for a total of 15 minutes or more over a 24-hour period.     N/A 3. ONSET: "When did the COVID-19 symptoms start?"    23rd 4. WORST SYMPTOM: "What is your worst symptom?" (e.g., cough, fever, shortness of breath, muscle aches)     SOB )2 sat is 91% 5. COUGH: "Do you have a cough?" If Yes, ask: "How bad is the cough?"       Yes dry 6. FEVER: "Do you have a fever?" If Yes, ask: "What is your temperature, how was it measured, and when did it start?"    99 7. RESPIRATORY STATUS: "Describe your breathing?" (e.g., shortness of breath, wheezing, unable to speak)     SOB 8. BETTER-SAME-WORSE: "Are you getting better, staying the same or getting worse compared to yesterday?"  If getting  worse, ask, "In what way?"     Same as last Sunday not improving 9. HIGH RISK DISEASE: "Do you have any chronic medical problems?" (e.g., asthma, heart or lung disease, weak immune system, obesity, etc.)     no 10. PREGNANCY: "Is there any chance you are pregnant?" "When was your last menstrual period?"      No 11. OTHER SYMPTOMS: "Do you have any other symptoms?"  (e.g., chills, fatigue, headache, loss of smell or taste, muscle pain, sore throat; new loss of smell or taste especially support the diagnosis of COVID-19)       Gone now  Protocols used: CORONAVIRUS (COVID-19) DIAGNOSED OR SUSPECTED-A-AH

## 2020-08-04 NOTE — Progress Notes (Signed)
Inpatient Diabetes Program Recommendations  AACE/ADA: New Consensus Statement on Inpatient Glycemic Control (2015)  Target Ranges:  Prepandial:   less than 140 mg/dL      Peak postprandial:   less than 180 mg/dL (1-2 hours)      Critically ill patients:  140 - 180 mg/dL   Results for RAILYNN, BALLO (MRN [id number]) as of 08/04/2020 15:08  Ref. Range 08/04/2020 01:10 08/04/2020 14:01  Glucose-Capillary Latest Ref Range: 70 - 99 mg/dL 449 (H) 753 (H)    Admit with: Multifocal bilateral pneumonia/ COVID+ (Tested positive for COVID on 07/24/20)  History: DM  Home DM Meds: Glipizide 10 mg Daily       Metformin 1000 mg BID  Current Orders: Levemir 9 units BID      Novolog Moderate Correction Scale/ SSI (0-15 units) Q4 hours      Tradjenta 5 mg Daily    Decadron 6 mg Daily  Levemir, Novolog, and Tradjenta to start today.  Agree with initiation of current Insulin orders.    --Will follow patient during hospitalization--  Ambrose Finland RN, MSN, CDE Diabetes Coordinator Inpatient Glycemic Control Team Team Pager: [phone] (8a-5p)

## 2020-08-04 NOTE — Progress Notes (Signed)
Remdesivir - Pharmacy Brief Note   O:  ALT: 39 CXR: Multifocal bilateral pneumonia greatest at the bases. SpO2: 92-94% on RA   A/P:  Remdesivir 200 mg IVPB once followed by 100 mg IVPB daily x 4 days.   Gardner Candle, PharmD, BCPS Clinical Pharmacist 08/04/2020 2:53 PM
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
## 2020-08-04 NOTE — ED Triage Notes (Signed)
Patient ambulatory to triage with steady gait, without difficulty or distress noted; pt reports dx with COVID/pneumonia on Sunday; rx steroids; c/o persistent SHOB and nonprod cough

## 2020-08-04 NOTE — ED Notes (Signed)
Pt ambulated around room on RA. Pt has dyspnea on exertion and SPO2 decreased to 87-88% on RA. Pt visibly SOB and has increased work of breathing. Pt recovered to 91-92% on RA once sitting back in bed. MD made aware.
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
## 2020-08-05 DIAGNOSIS — E1169 Type 2 diabetes mellitus with other specified complication: Secondary | ICD-10-CM

## 2020-08-05 DIAGNOSIS — I10 Essential (primary) hypertension: Secondary | ICD-10-CM

## 2020-08-05 DIAGNOSIS — E785 Hyperlipidemia, unspecified: Secondary | ICD-10-CM

## 2020-08-05 DIAGNOSIS — G8929 Other chronic pain: Secondary | ICD-10-CM

## 2020-08-05 LAB — CBC WITH DIFFERENTIAL/PLATELET
Abs Immature Granulocytes: 0.41 10*3/uL — ABNORMAL HIGH (ref 0.00–0.07)
Basophils Absolute: 0 10*3/uL (ref 0.0–0.1)
Basophils Relative: 1 %
Eosinophils Absolute: 0 10*3/uL (ref 0.0–0.5)
Eosinophils Relative: 0 %
HCT: 36.1 % (ref 36.0–46.0)
Hemoglobin: 13.4 g/dL (ref 12.0–15.0)
Immature Granulocytes: 5 %
Lymphocytes Relative: 17 %
Lymphs Abs: 1.4 10*3/uL (ref 0.7–4.0)
MCH: 30.1 pg (ref 26.0–34.0)
MCHC: 37.1 g/dL — ABNORMAL HIGH (ref 30.0–36.0)
MCV: 81.1 fL (ref 80.0–100.0)
Monocytes Absolute: 0.7 10*3/uL (ref 0.1–1.0)
Monocytes Relative: 9 %
Neutro Abs: 5.8 10*3/uL (ref 1.7–7.7)
Neutrophils Relative %: 68 %
Platelets: 442 10*3/uL — ABNORMAL HIGH (ref 150–400)
RBC: 4.45 MIL/uL (ref 3.87–5.11)
RDW: 11.9 % (ref 11.5–15.5)
WBC: 8.4 10*3/uL (ref 4.0–10.5)
nRBC: 0 % (ref 0.0–0.2)

## 2020-08-05 LAB — COMPREHENSIVE METABOLIC PANEL
ALT: 29 U/L (ref 0–44)
AST: 23 U/L (ref 15–41)
Albumin: 3.1 g/dL — ABNORMAL LOW (ref 3.5–5.0)
Alkaline Phosphatase: 101 U/L (ref 38–126)
Anion gap: 12 (ref 5–15)
BUN: 12 mg/dL (ref 6–20)
CO2: 24 mmol/L (ref 22–32)
Calcium: 9.8 mg/dL (ref 8.9–10.3)
Chloride: 101 mmol/L (ref 98–111)
Creatinine, Ser: 0.68 mg/dL (ref 0.44–1.00)
GFR calc Af Amer: >60 mL/min (ref >60)
GFR calc non Af Amer: >60 mL/min (ref >60)
Glucose, Bld: 280 mg/dL — ABNORMAL HIGH (ref 70–99)
Potassium: 3.6 mmol/L (ref 3.5–5.1)
Sodium: 137 mmol/L (ref 135–145)
Total Bilirubin: 0.7 mg/dL (ref 0.3–1.2)
Total Protein: 6.5 g/dL (ref 6.5–8.1)

## 2020-08-05 LAB — FIBRIN DERIVATIVES D-DIMER (ARMC ONLY): Fibrin derivatives D-dimer (ARMC): 363.9 ng/mL (FEU) (ref 0.00–499.00)

## 2020-08-05 LAB — GLUCOSE, CAPILLARY
Glucose-Capillary: 289 mg/dL — ABNORMAL HIGH (ref 70–99)
Glucose-Capillary: 240 mg/dL — ABNORMAL HIGH (ref 70–99)
Glucose-Capillary: 324 mg/dL — ABNORMAL HIGH (ref 70–99)
Glucose-Capillary: 354 mg/dL — ABNORMAL HIGH (ref 70–99)
Glucose-Capillary: 390 mg/dL — ABNORMAL HIGH (ref 70–99)
Glucose-Capillary: 374 mg/dL — ABNORMAL HIGH (ref 70–99)

## 2020-08-05 LAB — C-REACTIVE PROTEIN: CRP: 3.3 mg/dL — ABNORMAL HIGH (ref <1.0)

## 2020-08-05 LAB — FERRITIN: Ferritin: 124 ng/mL (ref 11–307)

## 2020-08-05 LAB — PHOSPHORUS: Phosphorus: 4.1 mg/dL (ref 2.5–4.6)

## 2020-08-05 LAB — MAGNESIUM: Magnesium: 1.8 mg/dL (ref 1.7–2.4)

## 2020-08-05 MED ORDER — OXYCODONE HCL 5 MG PO TABS
7.5000 mg | ORAL_TABLET | Freq: Three times a day (TID) | ORAL | Status: DC | PRN
  Administered 2020-08-05 – 2020-08-07 (×3): 7.5 mg via ORAL
  Filled 2020-08-05 (×3): qty 2

## 2020-08-05 MED ORDER — SODIUM CHLORIDE 0.9 % IV BOLUS
500.0000 mL | Freq: Once | INTRAVENOUS | Status: AC
  Administered 2020-08-05: 500 mL via INTRAVENOUS

## 2020-08-05 MED ORDER — METHYLPREDNISOLONE SODIUM SUCC 40 MG IJ SOLR
40.0000 mg | Freq: Two times a day (BID) | INTRAMUSCULAR | Status: DC
  Administered 2020-08-05 – 2020-08-07 (×5): 40 mg via INTRAVENOUS
  Filled 2020-08-05 (×5): qty 1

## 2020-08-05 MED ORDER — SODIUM CHLORIDE 0.9 % IV SOLN
INTRAVENOUS | Status: DC | PRN
  Administered 2020-08-05 – 2020-08-06 (×2): 250 mL via INTRAVENOUS
  Administered 2020-08-06: 500 mL via INTRAVENOUS
  Administered 2020-08-07: 11:00:00 250 mL via INTRAVENOUS

## 2020-08-05 MED ORDER — INSULIN ASPART 100 UNIT/ML ~~LOC~~ SOLN
5.0000 [IU] | Freq: Three times a day (TID) | SUBCUTANEOUS | Status: DC
  Administered 2020-08-05 – 2020-08-06 (×5): 5 [IU] via SUBCUTANEOUS
  Filled 2020-08-05 (×5): qty 1

## 2020-08-05 NOTE — Progress Notes (Signed)
Pt admitted [facility] room 112 from ED. Pt given Pt is placed on contact isolation for Covid-19 infection. Patient A&Ox4. Oriented to call bell, room and staff. Patient with sats of 90% and patient refused oxygen. After incentive spirometer and acapella  sats improved to 94%. Patient proned  3 times for 30 minutes each. Call bell kept within reach. Personal items kept within reach.

## 2020-08-05 NOTE — Progress Notes (Signed)
Patient ID: Alison Landry, female   DOB: 1971-05-01, 49 y.o.   MRN: [id number] [facility] PROGRESS NOTE  Madalee Altmann [id number]:[id number] DOB: [birth year] DOA: 08/04/2020 PCP: Malva Limes, MD  HPI/Subjective: Patient states that she was diagnosed with Covid and given Decadron as outpatient which she finished 1 week.  Patient coughing and short of breath especially if she walks.  Admitted with COVID-19 pneumonia.  Objective: Vitals:   08/05/20 0844 08/05/20 1150  BP: (!) 152/76 (!) 144/82  Pulse: 84 99  Resp: (!) 22 (!) 24  Temp: 98.1 F (36.7 C) 97.6 F (36.4 C)  SpO2: 92% 94%    Intake/Output Summary (Last 24 hours) at 08/05/2020 1550 Last data filed at 08/05/2020 1302 Gross per 24 hour  Intake 1104.68 ml  Output --  Net 1104.68 ml   Filed Weights   08/04/20 0023 08/05/20 0027  Weight: 86.2 kg 87 kg    ROS: Review of Systems  Respiratory: Positive for cough and shortness of breath.   Cardiovascular: Negative for chest pain.  Gastrointestinal: Negative for abdominal pain, nausea and vomiting.   Exam: Physical Exam HENT:     Nose: No mucosal edema.     Mouth/Throat:     Pharynx: No oropharyngeal exudate.  Eyes:     General: Lids are normal.     Extraocular Movements: Extraocular movements intact.  Cardiovascular:     Rate and Rhythm: Normal rate and regular rhythm.     Heart sounds: Normal heart sounds, S1 normal and S2 normal.  Pulmonary:     Breath sounds: Examination of the right-middle field reveals decreased breath sounds and wheezing. Examination of the left-middle field reveals decreased breath sounds and wheezing. Examination of the right-lower field reveals decreased breath sounds and rhonchi. Examination of the left-lower field reveals decreased breath sounds and rhonchi. Decreased breath sounds, wheezing and rhonchi present. No rales.  Abdominal:     Palpations: Abdomen is soft.     Tenderness: There is no abdominal tenderness.  Musculoskeletal:      Right lower leg: No swelling.     Left lower leg: No swelling.  Skin:    General: Skin is warm.     Findings: No rash.  Neurological:     Mental Status: She is alert and oriented to person, place, and time.       Data Reviewed: Basic Metabolic Panel: Recent Labs  Lab 08/04/20 0111 08/04/20 1542 08/05/20 0648  NA 136  --  137  K 3.6  --  3.6  CL 97*  --  101  CO2 24  --  24  GLUCOSE 283*  --  280*  BUN 14  --  12  CREATININE 0.83 0.57 0.68  CALCIUM 9.6  --  9.8  MG  --   --  1.8  PHOS  --   --  4.1   Liver Function Tests: Recent Labs  Lab 08/04/20 0111 08/05/20 0648  AST 30 23  ALT 39 29  ALKPHOS 119 101  BILITOT 0.9 0.7  PROT 7.5 6.5  ALBUMIN 3.5 3.1*   CBC: Recent Labs  Lab 08/04/20 0111 08/04/20 1542 08/05/20 0648  WBC 7.6 8.9 8.4  NEUTROABS 4.9  --  5.8  HGB 14.1 13.3 13.4  HCT 39.8 37.9 36.1  MCV 82.7 83.1 81.1  PLT 433* 433* 442*    CBG: Recent Labs  Lab 08/04/20 2041 08/05/20 0130 08/05/20 0452 08/05/20 0825 08/05/20 1146  GLUCAP 359* 289* 240* 324* 354*  Recent Results (from the past 240 hour(s))  SARS Coronavirus 2 by RT PCR (hospital order, performed in [facility] hospital lab) Nasopharyngeal Nasopharyngeal Swab     Status: Abnormal   Collection Time: 08/04/20  2:13 PM   Specimen: Nasopharyngeal Swab  Result Value Ref Range Status   SARS Coronavirus 2 POSITIVE (A) NEGATIVE Final    Comment: RESULT CALLED TO, READ BACK BY AND VERIFIED WITH: AMBER REDENCE RN AT 1603 ON 08/04/20 SNG (NOTE) SARS-CoV-2 target nucleic acids are DETECTED  SARS-CoV-2 RNA is generally detectable in upper respiratory specimens  during the acute phase of infection.  Positive results are indicative  of the presence of the identified virus, but do not rule out bacterial infection or co-infection with other pathogens not detected by the test.  Clinical correlation with patient history and  other diagnostic information is necessary to determine  patient infection status.  The expected result is negative.  Fact Sheet for Patients:   BoilerBrush.com.cy   Fact Sheet for Healthcare Providers:   https://pope.com/    This test is not yet approved or cleared by the [place] FDA and  has been authorized for detection and/or diagnosis of SARS-CoV-2 by FDA under an Emergency Use Authorization (EUA).  This EUA will remain in effect (meaning thi s test can be used) for the duration of  the COVID-19 declaration under Section 564(b)(1) of the Act, 21 U.S.C. section 360-bbb-3(b)(1), unless the authorization is terminated or revoked sooner.  Performed at [facility], [address] [place], [place] [address]      Studies: DG Chest 2 View  Result Date: 08/04/2020 CLINICAL DATA:  Shortness of breath, COVID-19 positive EXAM: CHEST - 2 VIEW COMPARISON:  05/24/2020 FINDINGS: Frontal and lateral views of the chest demonstrate an unremarkable cardiac silhouette. Multifocal bilateral airspace disease greatest at the lung bases. No effusion or pneumothorax. No acute bony abnormalities. IMPRESSION: 1. Multifocal bilateral pneumonia greatest at the bases, compatible with history of COVID 19. Electronically Signed   By: Sharlet Salina M.D.   On: 08/04/2020 00:51    Scheduled Meds: . albuterol  2 puff Inhalation Q6H  . amLODipine  10 mg Oral Daily  . vitamin C  500 mg Oral Daily  . atorvastatin  10 mg Oral QHS  . docusate sodium  100 mg Oral BID  . enoxaparin (LOVENOX) injection  40 mg Subcutaneous Q24H  . insulin aspart  0-15 Units Subcutaneous Q4H  . insulin aspart  5 Units Subcutaneous TID WC  . insulin detemir  0.1 Units/kg Subcutaneous BID  . linagliptin  5 mg Oral Daily  . methylPREDNISolone (SOLU-MEDROL) injection  40 mg Intravenous Q12H  . zinc sulfate  220 mg Oral Daily   Continuous Infusions: . sodium chloride Stopped (08/05/20 1259)  . remdesivir 100 mg in NS 100 mL  Stopped (08/05/20 1231)    Assessment/Plan:  1. COVID-19 pneumonia.  Continue day 2 of remdesivir.  Switch Decadron over to Solu-Medrol.  Continue albuterol inhaler.  Check pulse ox with ambulation.  Having too much bronchospasm to go home at this point since was recently on steroids at home. 2. Type 2 diabetes mellitus with hyperlipidemia.  Patient placed on Levemir insulin and linagliptin.  Patient on short acting insulin.  On Lipitor 3. Essential hypertension on amlodipine 4. Chronic pain on oxycodone      Code Status:     Code Status Orders  (From admission, onward)         Start  Ordered   08/04/20 1443  Full code  Continuous        08/04/20 1445        Code Status History    This patient has a current code status but no historical code status.   Advance Care Planning Activity     Family Communication: Patient deferred me calling family at this time Disposition Plan: Status is: Inpatient  Dispo: The patient is from: Home              Anticipated d/c is to: Home              Anticipated d/c date is: Evaluate on a daily basis on when to go home              Patient currently being treated for Covid pneumonia with bronchospasm receiving IV remdesivir and IV Solu-Medrol.  Time spent: 27 minutes  Eliza Griss[employer]

## 2020-08-05 NOTE — Progress Notes (Signed)
Results for SHAMERE, DILWORTH (MRN [id number]) as of 08/05/2020 08:41  Ref. Range 08/04/2020 15:44 08/04/2020 20:41 08/05/2020 01:30 08/05/2020 04:52 08/05/2020 08:25  Glucose-Capillary Latest Ref Range: 70 - 99 mg/dL 573 (H) 220 (H) 254 (H) 240 (H) 324 (H)  Noted that blood sugars continue to be greater than 180 mg/dl.  Recommend increasing Novolog correction scale to RESISTANT every 4 hours, then TID & HS scale if eating. If postprandial blood sugars continue to be elevated, recommend adding Novolog 3 units TID with meals if patient eats at least 50% of meal.   Smith Mince RN BSN CDE Diabetes Coordinator Pager: [phone]  8am-5pm

## 2020-08-05 NOTE — Progress Notes (Signed)
Pt reports of vaginal bleeding, Per pt she had ablation five years ago and since then only had spotting. Reports starting this morning she has been using a washcloth as a pad.  Dr Renae Gloss has been notified.  Lovenox d/c.

## 2020-08-06 DIAGNOSIS — J9600 Acute respiratory failure, unspecified whether with hypoxia or hypercapnia: Secondary | ICD-10-CM

## 2020-08-06 DIAGNOSIS — N939 Abnormal uterine and vaginal bleeding, unspecified: Secondary | ICD-10-CM

## 2020-08-06 LAB — CBC WITH DIFFERENTIAL/PLATELET
Abs Immature Granulocytes: 0.66 10*3/uL — ABNORMAL HIGH (ref 0.00–0.07)
Basophils Absolute: 0.1 10*3/uL (ref 0.0–0.1)
Basophils Relative: 1 %
Eosinophils Absolute: 0 10*3/uL (ref 0.0–0.5)
Eosinophils Relative: 0 %
HCT: 38.4 % (ref 36.0–46.0)
Hemoglobin: 14 g/dL (ref 12.0–15.0)
Immature Granulocytes: 6 %
Lymphocytes Relative: 12 %
Lymphs Abs: 1.3 10*3/uL (ref 0.7–4.0)
MCH: 30 pg (ref 26.0–34.0)
MCHC: 36.5 g/dL — ABNORMAL HIGH (ref 30.0–36.0)
MCV: 82.2 fL (ref 80.0–100.0)
Monocytes Absolute: 0.5 10*3/uL (ref 0.1–1.0)
Monocytes Relative: 5 %
Neutro Abs: 7.8 10*3/uL — ABNORMAL HIGH (ref 1.7–7.7)
Neutrophils Relative %: 76 %
Platelets: 444 10*3/uL — ABNORMAL HIGH (ref 150–400)
RBC: 4.67 MIL/uL (ref 3.87–5.11)
RDW: 12.1 % (ref 11.5–15.5)
Smear Review: NORMAL
WBC: 10.3 10*3/uL (ref 4.0–10.5)
nRBC: 0 % (ref 0.0–0.2)

## 2020-08-06 LAB — COMPREHENSIVE METABOLIC PANEL
ALT: 32 U/L (ref 0–44)
AST: 22 U/L (ref 15–41)
Albumin: 3.4 g/dL — ABNORMAL LOW (ref 3.5–5.0)
Alkaline Phosphatase: 97 U/L (ref 38–126)
Anion gap: 15 (ref 5–15)
BUN: 16 mg/dL (ref 6–20)
CO2: 22 mmol/L (ref 22–32)
Calcium: 9.7 mg/dL (ref 8.9–10.3)
Chloride: 98 mmol/L (ref 98–111)
Creatinine, Ser: 0.62 mg/dL (ref 0.44–1.00)
GFR calc Af Amer: >60 mL/min (ref >60)
GFR calc non Af Amer: >60 mL/min (ref >60)
Glucose, Bld: 360 mg/dL — ABNORMAL HIGH (ref 70–99)
Potassium: 4.1 mmol/L (ref 3.5–5.1)
Sodium: 135 mmol/L (ref 135–145)
Total Bilirubin: 0.6 mg/dL (ref 0.3–1.2)
Total Protein: 7.1 g/dL (ref 6.5–8.1)

## 2020-08-06 LAB — GLUCOSE, CAPILLARY
Glucose-Capillary: 334 mg/dL — ABNORMAL HIGH (ref 70–99)
Glucose-Capillary: 341 mg/dL — ABNORMAL HIGH (ref 70–99)
Glucose-Capillary: 342 mg/dL — ABNORMAL HIGH (ref 70–99)
Glucose-Capillary: 368 mg/dL — ABNORMAL HIGH (ref 70–99)
Glucose-Capillary: 441 mg/dL — ABNORMAL HIGH (ref 70–99)
Glucose-Capillary: 449 mg/dL — ABNORMAL HIGH (ref 70–99)
Glucose-Capillary: 496 mg/dL — ABNORMAL HIGH (ref 70–99)

## 2020-08-06 LAB — PHOSPHORUS: Phosphorus: 4.9 mg/dL — ABNORMAL HIGH (ref 2.5–4.6)

## 2020-08-06 LAB — HEPATITIS B SURFACE ANTIGEN: Hepatitis B Surface Ag: NONREACTIVE

## 2020-08-06 LAB — C-REACTIVE PROTEIN: CRP: 2.5 mg/dL — ABNORMAL HIGH (ref <1.0)

## 2020-08-06 LAB — FERRITIN: Ferritin: 119 ng/mL (ref 11–307)

## 2020-08-06 LAB — MAGNESIUM: Magnesium: 1.7 mg/dL (ref 1.7–2.4)

## 2020-08-06 LAB — FIBRIN DERIVATIVES D-DIMER (ARMC ONLY): Fibrin derivatives D-dimer (ARMC): 287.46 ng/mL (FEU) (ref 0.00–499.00)

## 2020-08-06 MED ORDER — INSULIN ASPART 100 UNIT/ML ~~LOC~~ SOLN: 0.0000 [IU] | Freq: Three times a day (TID) | SUBCUTANEOUS | Status: DC

## 2020-08-06 MED ORDER — INSULIN ASPART 100 UNIT/ML ~~LOC~~ SOLN
26.0000 [IU] | Freq: Once | SUBCUTANEOUS | Status: AC
  Administered 2020-08-06: 18:00:00 26 [IU] via SUBCUTANEOUS
  Filled 2020-08-06: qty 1

## 2020-08-06 MED ORDER — MAGNESIUM SULFATE 2 GM/50ML IV SOLN
2.0000 g | Freq: Once | INTRAVENOUS | Status: AC
  Administered 2020-08-06: 2 g via INTRAVENOUS
  Filled 2020-08-06: qty 50

## 2020-08-06 MED ORDER — HYDROCOD POLST-CPM POLST ER 10-8 MG/5ML PO SUER
5.0000 mL | Freq: Two times a day (BID) | ORAL | Status: DC | PRN
  Administered 2020-08-06 (×2): 5 mL via ORAL
  Filled 2020-08-06 (×2): qty 5

## 2020-08-06 MED ORDER — INSULIN DETEMIR 100 UNIT/ML ~~LOC~~ SOLN
0.1500 [IU]/kg | Freq: Two times a day (BID) | SUBCUTANEOUS | Status: DC
  Administered 2020-08-06: 10:00:00 13 [IU] via SUBCUTANEOUS
  Filled 2020-08-06 (×3): qty 0.13

## 2020-08-06 MED ORDER — INSULIN DETEMIR 100 UNIT/ML ~~LOC~~ SOLN
0.2000 [IU]/kg | Freq: Two times a day (BID) | SUBCUTANEOUS | Status: DC
  Administered 2020-08-06: 17 [IU] via SUBCUTANEOUS
  Filled 2020-08-06 (×3): qty 0.17

## 2020-08-06 MED ORDER — INSULIN ASPART 100 UNIT/ML ~~LOC~~ SOLN
0.0000 [IU] | Freq: Every day | SUBCUTANEOUS | Status: DC
  Administered 2020-08-06: 5 [IU] via SUBCUTANEOUS
  Filled 2020-08-06: qty 1

## 2020-08-06 NOTE — Progress Notes (Signed)
Pt ambulated with NT in pt's room.  Per NT O2 sats at rest 93% on RA. Pt desats to 83% during ambulation.  O2 sats up to low 90's% without an intervention once back to bed.

## 2020-08-06 NOTE — Progress Notes (Signed)
CBG at dinner time 496.  Dr. Renae Gloss made aware and placed orders.

## 2020-08-06 NOTE — Progress Notes (Signed)
Results for MAXWELL, MARTORANO (MRN [id number]) as of 08/06/2020 14:52  Ref. Range 08/05/2020 21:01 08/06/2020 00:38 08/06/2020 05:35 08/06/2020 08:06 08/06/2020 11:44  Glucose-Capillary Latest Ref Range: 70 - 99 mg/dL 739 (H) 584 (H) 417 (H) 341 (H) 368 (H)  Noted that blood sugars continue to be greater than 300 mg/dl.  Recommend increasing Levemir to 17 units BID and increasing Novolog correction scale to RESISTANT every 4 hours if NPO and then TID & Novolog 0-5 units HS scale. Continue Novolog 5 units TID with meals.  Smith Mince RN BSN CDE Diabetes Coordinator Pager: [phone]  8am-5pm

## 2020-08-06 NOTE — Progress Notes (Signed)
Patient ID: Alison Landry, female   DOB: November 13, 1971, 49 y.o.   MRN: [id number] [facility] PROGRESS NOTE  Aunesty Tyson QDI:[id number] DOB: 10/11/1971 DOA: 08/04/2020 PCP: Malva Limes, MD  HPI/Subjective: Patient feeling a little bit better after starting Solu-Medrol yesterday.  Still feels short of breath with walking around.  This morning when she ambulated she dropped her saturations down into the low 80s at 83%.  Patient still has some cough.  Admitted with Covid pneumonia.  Vaginal bleeding is less only seeing a little pinkish spotting after stopping Lovenox.  Objective: Vitals:   08/06/20 1147 08/06/20 1630  BP: (!) 148/100 121/69  Pulse: 92 86  Resp: 18 20  Temp: 98 F (36.7 C) 98 F (36.7 C)  SpO2: 91% 93%    Intake/Output Summary (Last 24 hours) at 08/06/2020 1718 Last data filed at 08/06/2020 1237 Gross per 24 hour  Intake 114.96 ml  Output --  Net 114.96 ml   Filed Weights   08/04/20 0023 08/05/20 0027  Weight: 86.2 kg 87 kg    ROS: Review of Systems  Respiratory: Positive for cough, shortness of breath and wheezing.   Cardiovascular: Negative for chest pain.  Gastrointestinal: Negative for abdominal pain, nausea and vomiting.   Exam: Physical Exam HENT:     Nose: No mucosal edema.     Mouth/Throat:     Pharynx: No oropharyngeal exudate.  Eyes:     General: Lids are normal.     Conjunctiva/sclera: Conjunctivae normal.  Cardiovascular:     Rate and Rhythm: Normal rate and regular rhythm.     Heart sounds: Normal heart sounds, S1 normal and S2 normal.  Pulmonary:     Breath sounds: Examination of the right-middle field reveals wheezing. Examination of the left-middle field reveals wheezing. Examination of the right-lower field reveals decreased breath sounds and rhonchi. Examination of the left-lower field reveals decreased breath sounds and rhonchi. Decreased breath sounds, wheezing and rhonchi present. No rales.  Abdominal:     Palpations: Abdomen is  soft.     Tenderness: There is no abdominal tenderness.  Musculoskeletal:     Right ankle: No swelling.     Left ankle: No swelling.  Skin:    General: Skin is warm.     Findings: No rash.  Neurological:     Mental Status: She is alert and oriented to person, place, and time.       Data Reviewed: Basic Metabolic Panel: Recent Labs  Lab 08/04/20 0111 08/04/20 1542 08/05/20 0648 08/06/20 0655  NA 136  --  137 135  K 3.6  --  3.6 4.1  CL 97*  --  101 98  CO2 24  --  24 22  GLUCOSE 283*  --  280* 360*  BUN 14  --  12 16  CREATININE 0.83 0.57 0.68 0.62  CALCIUM 9.6  --  9.8 9.7  MG  --   --  1.8 1.7  PHOS  --   --  4.1 4.9*   Liver Function Tests: Recent Labs  Lab 08/04/20 0111 08/05/20 0648 08/06/20 0655  AST 30 23 22   ALT 39 29 32  ALKPHOS 119 101 97  BILITOT 0.9 0.7 0.6  PROT 7.5 6.5 7.1  ALBUMIN 3.5 3.1* 3.4*   CBC: Recent Labs  Lab 08/04/20 0111 08/04/20 1542 08/05/20 0648 08/06/20 0655  WBC 7.6 8.9 8.4 10.3  NEUTROABS 4.9  --  5.8 7.8*  HGB 14.1 13.3 13.4 14.0  HCT 39.8 37.9 36.1  38.4  MCV 82.7 83.1 81.1 82.2  PLT 433* 433* 442* 444*    CBG: Recent Labs  Lab 08/06/20 0535 08/06/20 0806 08/06/20 1144 08/06/20 1628 08/06/20 1658  GLUCAP 334* 341* 368* 449* 496*    Recent Results (from the past 240 hour(s))  SARS Coronavirus 2 by RT PCR (hospital order, performed in [facility] hospital lab) Nasopharyngeal Nasopharyngeal Swab     Status: Abnormal   Collection Time: 08/04/20  2:13 PM   Specimen: Nasopharyngeal Swab  Result Value Ref Range Status   SARS Coronavirus 2 POSITIVE (A) NEGATIVE Final    Comment: RESULT CALLED TO, READ BACK BY AND VERIFIED WITH: AMBER REDENCE RN AT 1603 ON 08/04/20 SNG (NOTE) SARS-CoV-2 target nucleic acids are DETECTED  SARS-CoV-2 RNA is generally detectable in upper respiratory specimens  during the acute phase of infection.  Positive results are indicative  of the presence of the identified virus, but do not  rule out bacterial infection or co-infection with other pathogens not detected by the test.  Clinical correlation with patient history and  other diagnostic information is necessary to determine patient infection status.  The expected result is negative.  Fact Sheet for Patients:   BoilerBrush.com.cy   Fact Sheet for Healthcare Providers:   https://pope.com/    This test is not yet approved or cleared by the [place] FDA and  has been authorized for detection and/or diagnosis of SARS-CoV-2 by FDA under an Emergency Use Authorization (EUA).  This EUA will remain in effect (meaning thi s test can be used) for the duration of  the COVID-19 declaration under Section 564(b)(1) of the Act, 21 U.S.C. section 360-bbb-3(b)(1), unless the authorization is terminated or revoked sooner.  Performed at [facility], [address] [place], [place] [address]       Scheduled Meds: . albuterol  2 puff Inhalation Q6H  . amLODipine  10 mg Oral Daily  . vitamin C  500 mg Oral Daily  . atorvastatin  10 mg Oral QHS  . docusate sodium  100 mg Oral BID  . insulin aspart  0-15 Units Subcutaneous TID WC  . insulin aspart  0-5 Units Subcutaneous QHS  . insulin aspart  26 Units Subcutaneous Once  . insulin aspart  5 Units Subcutaneous TID WC  . insulin detemir  0.2 Units/kg Subcutaneous BID  . linagliptin  5 mg Oral Daily  . methylPREDNISolone (SOLU-MEDROL) injection  40 mg Intravenous Q12H  . zinc sulfate  220 mg Oral Daily   Continuous Infusions: . sodium chloride Stopped (08/06/20 1231)  . magnesium sulfate bolus IVPB    . remdesivir 100 mg in NS 100 mL Stopped (08/06/20 1102)    Assessment/Plan:  1. Acute hypoxic respiratory failure secondary to COVID-19 pneumonia.  Patient desaturated down to 83% with ambulation.  Continue to monitor with ambulation. 2. COVID-19 pneumonia.  Day 3 of remdesivir today.  Continue Solu-Medrol.   Continue albuterol inhaler.  Lung sounds a little bit better today but still noises in the lungs today. 3. Type 2 diabetes mellitus with hyperlipidemia and hyperglycemia.  On Levemir insulin and linagliptin increase Levemir insulin today secondary to high sugars increase sliding scale.  Added short acting insulin prior to meals.  Continue Lipitor. 4. Essential hypertension on amlodipine 5. Vaginal bleeding seems to have slowed down after stopping Lovenox.  Hemoglobin stable. 6. Chronic pain on oxycodone     Code Status:     Code Status Orders  (From admission, onward)  Start     Ordered   08/04/20 1443  Full code  Continuous        08/04/20 1445        Code Status History    This patient has a current code status but no historical code status.   Advance Care Planning Activity     Family Communication: Has deferred me calling family at this point Disposition Plan: Status is: Inpatient  Dispo: The patient is from: Home              Anticipated d/c is to: Home              Anticipated d/c date is: In another day or so depending on whether or not she is still hypoxic with ambulation              Patient currently being treated for acute hypoxic respiratory failure and COVID-19 pneumonia.  Requiring IV remdesivir and IV Solu-Medrol at this point.  Since the patient has good oxygen saturations at rest I am hoping to send her home not on oxygen.  Time spent: 28 minutes  [employer]ris [employer]

## 2020-08-07 LAB — GLUCOSE, CAPILLARY
Glucose-Capillary: 522 mg/dL (ref 70–99)
Glucose-Capillary: 420 mg/dL — ABNORMAL HIGH (ref 70–99)
Glucose-Capillary: 415 mg/dL — ABNORMAL HIGH (ref 70–99)
Glucose-Capillary: 419 mg/dL — ABNORMAL HIGH (ref 70–99)
Glucose-Capillary: 489 mg/dL — ABNORMAL HIGH (ref 70–99)

## 2020-08-07 LAB — MAGNESIUM: Magnesium: 1.9 mg/dL (ref 1.7–2.4)

## 2020-08-07 LAB — COMPREHENSIVE METABOLIC PANEL
ALT: 29 U/L (ref 0–44)
AST: 25 U/L (ref 15–41)
Albumin: 3.6 g/dL (ref 3.5–5.0)
Alkaline Phosphatase: 133 U/L — ABNORMAL HIGH (ref 38–126)
Anion gap: 13 (ref 5–15)
BUN: 25 mg/dL — ABNORMAL HIGH (ref 6–20)
CO2: 24 mmol/L (ref 22–32)
Calcium: 9.8 mg/dL (ref 8.9–10.3)
Chloride: 95 mmol/L — ABNORMAL LOW (ref 98–111)
Creatinine, Ser: 0.72 mg/dL (ref 0.44–1.00)
GFR calc Af Amer: >60 mL/min (ref >60)
GFR calc non Af Amer: >60 mL/min (ref >60)
Glucose, Bld: 536 mg/dL (ref 70–99)
Potassium: 4.9 mmol/L (ref 3.5–5.1)
Sodium: 132 mmol/L — ABNORMAL LOW (ref 135–145)
Total Bilirubin: 0.7 mg/dL (ref 0.3–1.2)
Total Protein: 7.1 g/dL (ref 6.5–8.1)

## 2020-08-07 LAB — FERRITIN: Ferritin: 105 ng/mL (ref 11–307)

## 2020-08-07 LAB — CBC WITH DIFFERENTIAL/PLATELET
Abs Immature Granulocytes: 0.98 10*3/uL — ABNORMAL HIGH (ref 0.00–0.07)
Basophils Absolute: 0.1 10*3/uL (ref 0.0–0.1)
Basophils Relative: 0 %
Eosinophils Absolute: 0 10*3/uL (ref 0.0–0.5)
Eosinophils Relative: 0 %
HCT: 39.1 % (ref 36.0–46.0)
Hemoglobin: 13.6 g/dL (ref 12.0–15.0)
Immature Granulocytes: 8 %
Lymphocytes Relative: 11 %
Lymphs Abs: 1.4 10*3/uL (ref 0.7–4.0)
MCH: 29.4 pg (ref 26.0–34.0)
MCHC: 34.8 g/dL (ref 30.0–36.0)
MCV: 84.6 fL (ref 80.0–100.0)
Monocytes Absolute: 0.5 10*3/uL (ref 0.1–1.0)
Monocytes Relative: 4 %
Neutro Abs: 9.4 10*3/uL — ABNORMAL HIGH (ref 1.7–7.7)
Neutrophils Relative %: 77 %
Platelets: 491 10*3/uL — ABNORMAL HIGH (ref 150–400)
RBC: 4.62 MIL/uL (ref 3.87–5.11)
RDW: 12.1 % (ref 11.5–15.5)
WBC: 12.3 10*3/uL — ABNORMAL HIGH (ref 4.0–10.5)
nRBC: 0 % (ref 0.0–0.2)

## 2020-08-07 LAB — HEMOGLOBIN A1C
Hgb A1c MFr Bld: 9.3 % — ABNORMAL HIGH (ref 4.8–5.6)
Mean Plasma Glucose: 220.21 mg/dL

## 2020-08-07 LAB — PHOSPHORUS: Phosphorus: 4.3 mg/dL (ref 2.5–4.6)

## 2020-08-07 LAB — C-REACTIVE PROTEIN: CRP: 2 mg/dL — ABNORMAL HIGH (ref <1.0)

## 2020-08-07 LAB — FIBRIN DERIVATIVES D-DIMER (ARMC ONLY): Fibrin derivatives D-dimer (ARMC): 223.91 ng/mL (FEU) (ref 0.00–499.00)

## 2020-08-07 MED ORDER — INSULIN STARTER KIT- PEN NEEDLES (ENGLISH): 1.0000 | Freq: Once | 0 refills | Status: AC

## 2020-08-07 MED ORDER — INSULIN DETEMIR 100 UNIT/ML ~~LOC~~ SOLN
0.2700 [IU]/kg | Freq: Two times a day (BID) | SUBCUTANEOUS | Status: DC
  Administered 2020-08-07 (×2): 23 [IU] via SUBCUTANEOUS
  Filled 2020-08-07 (×3): qty 0.23

## 2020-08-07 MED ORDER — INSULIN LISPRO (1 UNIT DIAL) 100 UNIT/ML (KWIKPEN): 8.0000 [IU] | PEN_INJECTOR | Freq: Three times a day (TID) | SUBCUTANEOUS | 0 refills | Status: DC

## 2020-08-07 MED ORDER — INSULIN ASPART 100 UNIT/ML ~~LOC~~ SOLN
25.0000 [IU] | Freq: Once | SUBCUTANEOUS | Status: AC
  Administered 2020-08-07: 25 [IU] via SUBCUTANEOUS
  Filled 2020-08-07: qty 1

## 2020-08-07 MED ORDER — INSULIN ASPART 100 UNIT/ML ~~LOC~~ SOLN
30.0000 [IU] | Freq: Once | SUBCUTANEOUS | Status: AC
  Administered 2020-08-07: 13:00:00 30 [IU] via SUBCUTANEOUS
  Filled 2020-08-07: qty 1

## 2020-08-07 MED ORDER — INSULIN DETEMIR 100 UNIT/ML ~~LOC~~ SOLN
0.2300 [IU]/kg | Freq: Two times a day (BID) | SUBCUTANEOUS | Status: DC
  Filled 2020-08-07 (×2): qty 0.2

## 2020-08-07 MED ORDER — ZINC SULFATE 220 (50 ZN) MG PO CAPS
220.0000 mg | ORAL_CAPSULE | Freq: Every day | ORAL | 0 refills | Status: DC
Start: 2020-08-08 — End: 2021-02-06

## 2020-08-07 MED ORDER — ASCORBIC ACID 500 MG PO TABS
500.0000 mg | ORAL_TABLET | Freq: Every day | ORAL | 0 refills | Status: DC
Start: 2020-08-08 — End: 2021-02-06

## 2020-08-07 MED ORDER — INSULIN STARTER KIT- PEN NEEDLES (ENGLISH)
1.0000 | Freq: Once | Status: AC
  Administered 2020-08-07: 1
  Filled 2020-08-07: qty 1

## 2020-08-07 MED ORDER — INSULIN PEN NEEDLE 32G X 4 MM MISC: 1.0000 | Freq: Three times a day (TID) | 0 refills | Status: DC

## 2020-08-07 MED ORDER — HYDROCOD POLST-CPM POLST ER 10-8 MG/5ML PO SUER
5.0000 mL | Freq: Two times a day (BID) | ORAL | 0 refills | Status: DC | PRN
Start: 2020-08-07 — End: 2020-08-15

## 2020-08-07 MED ORDER — INSULIN DETEMIR 100 UNIT/ML FLEXPEN: PEN_INJECTOR | SUBCUTANEOUS | 0 refills | Status: DC

## 2020-08-07 MED ORDER — PREDNISONE 20 MG PO TABS: ORAL_TABLET | ORAL | 0 refills | Status: DC

## 2020-08-07 MED ORDER — INSULIN ASPART 100 UNIT/ML ~~LOC~~ SOLN
30.0000 [IU] | Freq: Once | SUBCUTANEOUS | Status: AC
  Administered 2020-08-07: 08:00:00 30 [IU] via SUBCUTANEOUS
  Filled 2020-08-07: qty 1

## 2020-08-07 MED ORDER — ALBUTEROL SULFATE HFA 108 (90 BASE) MCG/ACT IN AERS: 2.0000 | INHALATION_SPRAY | Freq: Four times a day (QID) | RESPIRATORY_TRACT | 0 refills | Status: DC | PRN

## 2020-08-07 NOTE — Discharge Instructions (Addendum)
10 Things You Can Do to Manage Your COVID-19 Symptoms at Home If you have possible or confirmed COVID-19: 1. Stay home from work and school. And stay away from other public places. If you must go out, avoid using any kind of public transportation, ridesharing, or taxis. 2. Monitor your symptoms carefully. If your symptoms get worse, call your healthcare provider immediately. 3. Get rest and stay hydrated. 4. If you have a medical appointment, call the healthcare provider ahead of time and tell them that you have or may have COVID-19. 5. For medical emergencies, call 911 and notify the dispatch personnel that you have or may have COVID-19. 6. Cover your cough and sneezes with a tissue or use the inside of your elbow. 7. Wash your hands often with soap and water for at least 20 seconds or clean your hands with an alcohol-based hand sanitizer that contains at least 60% alcohol. 8. As much as possible, stay in a specific room and away from other people in your home. Also, you should use a separate bathroom, if available. If you need to be around other people in or outside of the home, wear a mask. 9. Avoid sharing personal items with other people in your household, like dishes, towels, and bedding. 10. Clean all surfaces that are touched often, like counters, tabletops, and doorknobs. Use household cleaning sprays or wipes according to the label instructions. [email or web address] 06/02/2019 This information is not intended to replace advice given to you by your health care provider. Make sure you discuss any questions you have with your health care provider. Document Revised: 11/04/2019 Document Reviewed: 11/04/2019 Elsevier Patient Education  [place].  COVID-19: How to Protect Yourself and Others Know how it spreads  There is currently no vaccine to prevent coronavirus disease 2019 (COVID-19).  The best way to prevent illness is to avoid being exposed to this virus.  The virus is  thought to spread mainly from person-to-person. ? Between people who are in close contact with one another (within about 6 feet). ? Through respiratory droplets produced when an infected person coughs, sneezes or talks. ? These droplets can land in the mouths or noses of people who are nearby or possibly be inhaled into the lungs. ? COVID-19 may be spread by people who are not showing symptoms. Everyone should Clean your hands often  Wash your hands often with soap and water for at least 20 seconds especially after you have been in a public place, or after blowing your nose, coughing, or sneezing.  If soap and water are not readily available, use a hand sanitizer that contains at least 60% alcohol. Cover all surfaces of your hands and rub them together until they feel dry.  Avoid touching your eyes, nose, and mouth with unwashed hands. Avoid close contact  Limit contact with others as much as possible.  Avoid close contact with people who are sick.  Put distance between yourself and other people. ? Remember that some people without symptoms may be able to spread virus. ? This is especially important for people who are at higher risk of getting very [email or web address] Cover your mouth and nose with a mask when around others  You could spread COVID-19 to others even if you do not feel sick.  Everyone should wear a mask in public settings and when around people not living in their household, especially when social distancing is difficult to maintain. ? Masks should not be placed on young children under age 36, anyone who  has trouble breathing, or is unconscious, incapacitated or otherwise unable to remove the mask without assistance.  The mask is meant to protect other people in case you are infected.  Do NOT use a facemask meant for a Research scientist (physical sciences).  Continue to keep about 6 feet between yourself and others. The  mask is not a substitute for social distancing. Cover coughs and sneezes  Always cover your mouth and nose with a tissue when you cough or sneeze or use the inside of your elbow.  Throw used tissues in the trash.  Immediately wash your hands with soap and water for at least 20 seconds. If soap and water are not readily available, clean your hands with a hand sanitizer that contains at least 60% alcohol. Clean and disinfect  Clean AND disinfect frequently touched surfaces daily. This includes tables, doorknobs, light switches, countertops, handles, desks, phones, keyboards, toilets, faucets, and sinks. [email or web address]  If surfaces are dirty, clean them: Use detergent or soap and water prior to disinfection.  Then, use a household disinfectant. You can see a list of EPA-registered household disinfectants here. [email or web address] 08/04/2019 This information is not intended to replace advice given to you by your health care provider. Make sure you discuss any questions you have with your health care provider. Document Revised: 08/12/2019 Document Reviewed: 06/10/2019 Elsevier Patient Education  2020 Elsevier Inc.  COVID-19: How to Protect Yourself and Others Know how it spreads  There is currently no vaccine to prevent coronavirus disease 2019 (COVID-19).  The best way to prevent illness is to avoid being exposed to this virus.  The virus is thought to spread mainly from person-to-person. ? Between people who are in close contact with one another (within about 6 feet). ? Through respiratory droplets produced when an infected person coughs, sneezes or talks. ? These droplets can land in the mouths or noses of people who are nearby or possibly be inhaled into the lungs. ? COVID-19 may be spread by people who are not showing symptoms. Everyone should Clean your hands often  Wash your hands often with soap and water for  at least 20 seconds especially after you have been in a public place, or after blowing your nose, coughing, or sneezing.  If soap and water are not readily available, use a hand sanitizer that contains at least 60% alcohol. Cover all surfaces of your hands and rub them together until they feel dry.  Avoid touching your eyes, nose, and mouth with unwashed hands. Avoid close contact  Limit contact with others as much as possible.  Avoid close contact with people who are sick.  Put distance between yourself and other people. ? Remember that some people without symptoms may be able to spread virus. ? This is especially important for people who are at higher risk of getting very [email or web address] Cover your mouth and nose with a mask when around others  You could spread COVID-19 to others even if you do not feel sick.  Everyone should wear a mask in public settings and when around people not living in their household, especially when social distancing is difficult to maintain. ? Masks should not be placed on young children under age 37, anyone who has trouble breathing, or is unconscious, incapacitated or otherwise unable to remove the mask without assistance.  The mask is meant to protect other people in case you are infected.  Do NOT use a facemask meant for a Research scientist (physical sciences).  Continue to keep about 6 feet between yourself and  others. The mask is not a substitute for social distancing. Cover coughs and sneezes  Always cover your mouth and nose with a tissue when you cough or sneeze or use the inside of your elbow.  Throw used tissues in the trash.  Immediately wash your hands with soap and water for at least 20 seconds. If soap and water are not readily available, clean your hands with a hand sanitizer that contains at least 60% alcohol. Clean and disinfect  Clean AND disinfect frequently touched surfaces daily.  This includes tables, doorknobs, light switches, countertops, handles, desks, phones, keyboards, toilets, faucets, and sinks. [email or web address]  If surfaces are dirty, clean them: Use detergent or soap and water prior to disinfection.  Then, use a household disinfectant. You can see a list of EPA-registered household disinfectants here. [email or web address] 08/04/2019 This information is not intended to replace advice given to you by your health care provider. Make sure you discuss any questions you have with your health care provider. Document Revised: 08/12/2019 Document Reviewed: 06/10/2019 Elsevier Patient Education  2020 Elsevier Inc. Patient scheduled for outpatient Remdesivir infusions at [facility] Tuesday 9/7 at [facility]. Please inform the patient to park at [address] [place], [place], as staff will be escorting the patient through the east entrance of the hospital.    There is a wave flag banner located near the entrance on [address]. Turn into this entrance and immediately turn left and park in 1 of the 5 designated Covid Infusion Parking spots. There is a phone number on the sign, please call and let the staff know what spot you are in and we will come out and get you. For questions call [phone].  Thanks.  * If patient is getting dropped off, you may have your ride pull up to the Main Entrance of [facility]. Please stay in the car and call [phone], staff will meet you at your car and escort you into the hospital and back to the clinic.       10 Things You Can Do to Manage Your COVID-19 Symptoms at Home If you have possible or confirmed COVID-19: 11. Stay home from work and school. And stay away from other public places. If you must go out, avoid using any kind of public transportation, ridesharing, or taxis. 12. Monitor your symptoms carefully. If your symptoms get worse, call your  healthcare provider immediately. 13. Get rest and stay hydrated. 14. If you have a medical appointment, call the healthcare provider ahead of time and tell them that you have or may have COVID-19. 15. For medical emergencies, call 911 and notify the dispatch personnel that you have or may have COVID-19. 16. Cover your cough and sneezes with a tissue or use the inside of your elbow. 17. Wash your hands often with soap and water for at least 20 seconds or clean your hands with an alcohol-based hand sanitizer that contains at least 60% alcohol. 18. As much as possible, stay in a specific room and away from other people in your home. Also, you should use a separate bathroom, if available. If you need to be around other people in or outside of the home, wear a mask. 19. Avoid sharing personal items with other people in your household, like dishes, towels, and bedding. 20. Clean all surfaces that are touched often, like counters, tabletops, and doorknobs. Use household cleaning sprays or wipes according to the label instructions. [email or web address] 06/02/2019 This information is not intended to replace advice given  to you by your health care provider. Make sure you discuss any questions you have with your health care provider. Document Revised: 11/04/2019 Document Reviewed: 11/04/2019 Elsevier Patient Education  2020 Elsevier Inc.  COVID-19: How to Protect Yourself and Others Know how it spreads  There is currently no vaccine to prevent coronavirus disease 2019 (COVID-19).  The best way to prevent illness is to avoid being exposed to this virus.  The virus is thought to spread mainly from person-to-person. ? Between people who are in close contact with one another (within about 6 feet). ? Through respiratory droplets produced when an infected person coughs, sneezes or talks. ? These droplets can land in the mouths or noses of people who are nearby or possibly be inhaled into the  lungs. ? COVID-19 may be spread by people who are not showing symptoms. Everyone should Clean your hands often  Wash your hands often with soap and water for at least 20 seconds especially after you have been in a public place, or after blowing your nose, coughing, or sneezing.  If soap and water are not readily available, use a hand sanitizer that contains at least 60% alcohol. Cover all surfaces of your hands and rub them together until they feel dry.  Avoid touching your eyes, nose, and mouth with unwashed hands. Avoid close contact  Limit contact with others as much as possible.  Avoid close contact with people who are sick.  Put distance between yourself and other people. ? Remember that some people without symptoms may be able to spread virus. ? This is especially important for people who are at higher risk of getting very RetroStamps.it Cover your mouth and nose with a mask when around others  You could spread COVID-19 to others even if you do not feel sick.  Everyone should wear a mask in public settings and when around people not living in their household, especially when social distancing is difficult to maintain. ? Masks should not be placed on young children under age 17, anyone who has trouble breathing, or is unconscious, incapacitated or otherwise unable to remove the mask without assistance.  The mask is meant to protect other people in case you are infected.  Do NOT use a facemask meant for a Research scientist (physical sciences).  Continue to keep about 6 feet between yourself and others. The mask is not a substitute for social distancing. Cover coughs and sneezes  Always cover your mouth and nose with a tissue when you cough or sneeze or use the inside of your elbow.  Throw used tissues in the trash.  Immediately wash your hands with soap and water for at least 20 seconds. If soap and water are not readily  available, clean your hands with a hand sanitizer that contains at least 60% alcohol. Clean and disinfect  Clean AND disinfect frequently touched surfaces daily. This includes tables, doorknobs, light switches, countertops, handles, desks, phones, keyboards, toilets, faucets, and sinks. [email or web address]  If surfaces are dirty, clean them: Use detergent or soap and water prior to disinfection.  Then, use a household disinfectant. You can see a list of EPA-registered household disinfectants here. [email or web address] 08/04/2019 This information is not intended to replace advice given to you by your health care provider. Make sure you discuss any questions you have with your health care provider. Document Revised: 08/12/2019 Document Reviewed: 06/10/2019 Elsevier Patient Education  2020 [employer].   COVID-19 Frequently Asked Questions COVID-19 (coronavirus disease) is an infection that is caused by a large  family of viruses. Some viruses cause illness in people and others cause illness in animals like camels, cats, and bats. In some cases, the viruses that cause illness in animals can spread to humans. Where did the coronavirus come from? In December 2019, Armeniahina told the [employer] Health [employer] [facility](WHO) of several cases of lung disease (human respiratory illness). These cases were linked to an open seafood and [place] market [place]. The link to the seafood and livestock market suggests that the virus may have spread from animals to humans. However, since that first outbreak in December, the virus has also been shown to spread from person to person. What is the name of the disease and the virus? Disease name Early on, this disease was called novel coronavirus. This is because scientists determined that the disease was caused by a new (novel) respiratory virus. The World Health Organization [facility](WHO) has now named the  disease COVID-19, or coronavirus disease. Virus name The virus that causes the disease is called severe acute respiratory syndrome coronavirus 2 (SARS-CoV-2). More information on disease and virus naming World Health Organization [facility](WHO): www.who.int/emergencies/diseases/novel-coronavirus-2019/technical-guidance/naming-the-coronavirus-disease-(covid-2019)-and-the-virus-that-causes-it Who is at risk for complications from coronavirus disease? Some people may be at higher risk for complications from coronavirus disease. This includes older adults and people who have chronic diseases, such as heart disease, diabetes, and lung disease. If you are at higher risk for complications, take these extra precautions:  Stay home as much as possible.  Avoid social gatherings and travel.  Avoid close contact with others. Stay at least 6 ft (2 m) away from others, if possible.  Wash your hands often with soap and water for at least 20 seconds.  Avoid touching your face, mouth, nose, or eyes.  Keep supplies on hand at home, such as food, medicine, and cleaning supplies.  If you must go out in public, wear a cloth face covering or face mask. Make sure your mask covers your nose and mouth. How does coronavirus disease spread? The virus that causes coronavirus disease spreads easily from person to person (is contagious). You may catch the virus by:  Breathing in droplets from an infected person. Droplets can be spread by a person breathing, speaking, singing, coughing, or sneezing.  Touching something, like a table or a doorknob, that was exposed to the virus (contaminated) and then touching your mouth, nose, or eyes. Can I get the virus from touching surfaces or objects? There is still a lot that we do not know about the virus that causes coronavirus disease. Scientists are basing a lot of information on what they know about similar viruses, such as:  Viruses cannot generally survive on surfaces for long.  They need a human body (host) to survive.  It is more likely that the virus is spread by close contact with people who are sick (direct contact), such as through: ? Shaking hands or hugging. ? Breathing in respiratory droplets that travel through the air. Droplets can be spread by a person breathing, speaking, singing, coughing, or sneezing.  It is less likely that the virus is spread when a person touches a surface or object that has the virus on it (indirect contact). The virus may be able to enter the body if the person touches a surface or object and then touches his or her face, eyes, nose, or mouth. Can a person spread the virus without having symptoms of the disease? It may be possible for the virus to spread before a person has symptoms of  the disease, but this is most likely not the main way the virus is spreading. It is more likely for the virus to spread by being in close contact with people who are sick and breathing in the respiratory droplets spread by a person breathing, speaking, singing, coughing, or sneezing. What are the symptoms of coronavirus disease? Symptoms vary from person to person and can range from mild to severe. Symptoms may include:  Fever or chills.  Cough.  Difficulty breathing or feeling short of breath.  Headaches, body aches, or muscle aches.  Runny or stuffy (congested) nose.  Sore throat.  New loss of taste or smell.  Nausea, vomiting, or diarrhea. These symptoms can appear anywhere from 2 to 14 days after you have been exposed to the virus. Some people may not have any symptoms. If you develop symptoms, call your health care provider. People with severe symptoms may need hospital care. Should I be tested for this virus? Your health care provider will decide whether to test you based on your symptoms, history of exposure, and your risk factors. How does a health care provider test for this virus? Health care providers will collect samples to send  for testing. Samples may include:  Taking a swab of fluid from the back of your nose and throat, your nose, or your throat.  Taking fluid from the lungs by having you cough up mucus (sputum) into a sterile cup.  Taking a blood sample. Is there a treatment or vaccine for this virus? Currently, there is no vaccine to prevent coronavirus disease. Also, there are no medicines like antibiotics or antivirals to treat the virus. A person who becomes sick is given supportive care, which means rest and fluids. A person may also relieve his or her symptoms by using over-the-counter medicines that treat sneezing, coughing, and runny nose. These are the same medicines that a person takes for the common cold. If you develop symptoms, call your health care provider. People with severe symptoms may need hospital care. What can I do to protect myself and my family from this virus?     You can protect yourself and your family by taking the same actions that you would take to prevent the spread of other viruses. Take the following actions:  Wash your hands often with soap and water for at least 20 seconds. If soap and water are not available, use alcohol-based hand sanitizer.  Avoid touching your face, mouth, nose, or eyes.  Cough or sneeze into a tissue, sleeve, or elbow. Do not cough or sneeze into your hand or the air. ? If you cough or sneeze into a tissue, throw it away immediately and wash your hands.  Disinfect objects and surfaces that you frequently touch every day.  Stay away from people who are sick.  Avoid going out in public, follow guidance from your state and local health authorities.  Avoid crowded indoor spaces. Stay at least 6 ft (2 m) away from others.  If you must go out in public, wear a cloth face covering or face mask. Make sure your mask covers your nose and mouth.  Stay home if you are sick, except to get medical care. Call your health care provider before you get medical  care. Your health care provider will tell you how long to stay home.  Make sure your vaccines are up to date. Ask your health care provider what vaccines you need. What should I do if I need to travel? Follow travel recommendations  from your local health authority, the CDC, and WHO. Travel information and advice  Centers for Disease Control and Prevention (CDC): GeminiCard.gl  World Health Organization [facility]): PreviewDomains.se Know the risks and take action to protect your health  You are at higher risk of getting coronavirus disease if you are traveling to areas with an outbreak or if you are exposed to travelers from areas with an outbreak.  Wash your hands often and practice good hygiene to lower the risk of catching or spreading the virus. What should I do if I am sick? General instructions to stop the spread of infection  Wash your hands often with soap and water for at least 20 seconds. If soap and water are not available, use alcohol-based hand sanitizer.  Cough or sneeze into a tissue, sleeve, or elbow. Do not cough or sneeze into your hand or the air.  If you cough or sneeze into a tissue, throw it away immediately and wash your hands.  Stay home unless you must get medical care. Call your health care provider or local health authority before you get medical care.  Avoid public areas. Do not take public transportation, if possible.  If you can, wear a mask if you must go out of the house or if you are in close contact with someone who is not sick. Make sure your mask covers your nose and mouth. Keep your home clean  Disinfect objects and surfaces that are frequently touched every day. This may include: ? Counters and tables. ? Doorknobs and light switches. ? Sinks and faucets. ? Electronics such as phones, remote controls, keyboards, computers, and tablets.  Wash dishes in hot,  soapy water or use a dishwasher. Air-dry your dishes.  Wash laundry in hot water. Prevent infecting other household members  Let healthy household members care for children and pets, if possible. If you have to care for children or pets, wash your hands often and wear a mask.  Sleep in a different bedroom or bed, if possible.  Do not share personal items, such as razors, toothbrushes, deodorant, combs, brushes, towels, and washcloths. Where to find more information Centers for Disease Control and Prevention (CDC)  Information and news updates: CardRetirement.cz World Health Organization [facility])  Information and news updates: AffordableSalon.es  Coronavirus health topic: https://thompson-craig.com/  Questions and answers on COVID-19: kruiseway.com  Global tracker: who.sprinklr.com American Academy of Pediatrics (AAP)  Information for families: www.healthychildren.org/English/health-issues/conditions/chest-lungs/Pages/2019-Novel-Coronavirus.aspx The coronavirus situation is changing rapidly. Check your local health authority website or the CDC and [facility] websites for updates and news. When should I contact a health care provider?  Contact your health care provider if you have symptoms of an infection, such as fever or cough, and you: ? Have been near anyone who is known to have coronavirus disease. ? Have come into contact with a person who is suspected to have coronavirus disease. ? Have traveled to an area where there is an outbreak of COVID-19. When should I get emergency medical care?  Get help right away by calling your local emergency services (911 in the [place] if you have: ? Trouble breathing. ? Pain or pressure in your chest. ? Confusion. ? Blue-tinged lips and fingernails. ? Difficulty waking from sleep. ? Symptoms that get worse. Let the emergency medical personnel know if  you think you have coronavirus disease. Summary  A new respiratory virus is spreading from person to person and causing COVID-19 (coronavirus disease).  The virus that causes COVID-19 appears to spread easily. It spreads  from one person to another through droplets from breathing, speaking, singing, coughing, or sneezing.  Older adults and those with chronic diseases are at higher risk of disease. If you are at higher risk for complications, take extra precautions.  There is currently no vaccine to prevent coronavirus disease. There are no medicines, such as antibiotics or antivirals, to treat the virus.  You can protect yourself and your family by washing your hands often, avoiding touching your face, and covering your coughs and sneezes. This information is not intended to replace advice given to you by your health care provider. Make sure you discuss any questions you have with your health care provider. Document Revised: 09/17/2019 Document Reviewed: 03/16/2019 Elsevier Patient Education  2020 [employer].
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
## 2020-08-07 NOTE — Care Management Important Message (Signed)
Important Message  Patient Details  Name: Morrison Masser MRN: [id number] Date of Birth: 1971-10-13   Medicare Important Message Given:  Yes  Reviewed with patient over room phone due to isolation status.  Verbal consent given to sign initial Medicare IM.  Copy sent securely to email address on file once confirmed:  [email or web address].    Johnell Comings 08/07/2020, 2:04 PM

## 2020-08-07 NOTE — Progress Notes (Signed)
Dr. Leslye Peer paged about discharging patient home with insulin. Will be discharged on Levemir 20 units BID for 3 days and novolog 8 units TID with meals.   Spoke to staff RN and asked to please have patient practice giving an insulin injection to herself. Will need further education on insulin administration for discharge. Insulin pen starter kit ordered from pharmacy. Please make sure patient gets this kit with instructions on how to use insulin pen.   Spoke with patient on the phone and reviewed home insulin orders, to check blood sugars at home at x4 per day and record. If blood sugars continue to be greater than 300 mg/dl, call PCP office. Patient states that her blood sugars are always 250-300 mg/dl at home.   Harvel Ricks RN BSN CDE Diabetes Coordinator Pager: [phone]  8am-5pm

## 2020-08-07 NOTE — Progress Notes (Addendum)
Patient's oxygen maintained above 90% with ambulation. Blood sugar remains elevated. Patient reviewed diabetes management with diabetes coordinator. Patient instructed how to use pen at home, also stated that she has experience with it. MD called to add 25 units short acting insulin to be administred  prior to dischare also ordered that levemir be given now, prior to discharge.

## 2020-08-08 ENCOUNTER — Other Ambulatory Visit: Payer: Self-pay | Admitting: Family Medicine

## 2020-08-08 ENCOUNTER — Telehealth: Payer: Self-pay

## 2020-08-08 ENCOUNTER — Ambulatory Visit (HOSPITAL_COMMUNITY)
Admit: 2020-08-08 | Discharge: 2020-08-08 | Disposition: A | Discharge status: Home / Self Care | Payer: Medicare Other | Attending: Pulmonary Disease | Admitting: Pulmonary Disease

## 2020-08-08 DIAGNOSIS — J1289 Other viral pneumonia: Secondary | ICD-10-CM | POA: Insufficient documentation

## 2020-08-08 DIAGNOSIS — U071 COVID-19: Secondary | ICD-10-CM | POA: Diagnosis not present

## 2020-08-08 MED ORDER — FAMOTIDINE IN NACL 20-0.9 MG/50ML-% IV SOLN: 20.0000 mg | Freq: Once | INTRAVENOUS | Status: DC | PRN

## 2020-08-08 MED ORDER — DIPHENHYDRAMINE HCL 50 MG/ML IJ SOLN: 50.0000 mg | Freq: Once | INTRAMUSCULAR | Status: DC | PRN

## 2020-08-08 MED ORDER — ONETOUCH ULTRA 2 W/DEVICE KIT: PACK | 0 refills | Status: DC

## 2020-08-08 MED ORDER — SODIUM CHLORIDE 0.9 % IV SOLN: INTRAVENOUS | Status: DC | PRN

## 2020-08-08 MED ORDER — ALBUTEROL SULFATE HFA 108 (90 BASE) MCG/ACT IN AERS: 2.0000 | INHALATION_SPRAY | Freq: Once | RESPIRATORY_TRACT | Status: DC | PRN

## 2020-08-08 MED ORDER — EPINEPHRINE 0.3 MG/0.3ML IJ SOAJ: 0.3000 mg | Freq: Once | INTRAMUSCULAR | Status: DC | PRN

## 2020-08-08 MED ORDER — METHYLPREDNISOLONE SODIUM SUCC 125 MG IJ SOLR: 125.0000 mg | Freq: Once | INTRAMUSCULAR | Status: DC | PRN

## 2020-08-08 MED ORDER — SODIUM CHLORIDE 0.9 % IV SOLN
100.0000 mg | Freq: Once | INTRAVENOUS | Status: AC
  Administered 2020-08-08: 100 mg via INTRAVENOUS
  Filled 2020-08-08: qty 20

## 2020-08-08 NOTE — Telephone Encounter (Signed)
Virtual HFU scheduled for 08/15/20 @ 1:00 PM. Pt would like this completed through Mychart. FYI!

## 2020-08-08 NOTE — Telephone Encounter (Signed)
Transition Care Management Follow-up Telephone Call  Date of discharge and from where: [facility] on 08/07/20  How have you been since you were released from the hospital? Pt states she has been sweaty and clammy since being in the hospital. Pt states she has not been running a fever. Pt thinks this may be due to the steroid. Pt states she feels weak and shaky. Pt has not got her new medications from the pharmacy yet, but plans to today. Pt has not been able to check her BS due to not having a working glucose meter and needs a new one sent to pharmacy. Pt ate eggs and a biscuit this morning and felt fine afterwards. Pt did not sleep well last night due to sweating. Pt is coughing but states her pulse oximeter is showing her O2 level at 90-91%. Pt has been using the albuterol inhaler for SOB.  Declines fever, sputum, new pain or n/v/d.  Any questions or concerns? Yes, see above regarding the sweating and needing a new glucose meter.  Items Reviewed:  Did the pt receive and understand the discharge instructions provided? Yes   Medications obtained and verified? No, pt declined reviewing at this time.  Any new allergies since your discharge? No   Dietary orders reviewed? N/A  Do you have support at home? Yes   Other (ie: DME, Home Health, etc): N/A  Functional Questionnaire: (I = Independent and D = Dependent)  Bathing/Dressing- I   Meal Prep- I  Eating- I  Maintaining continence- I, has had urine leakage with coughing.  Transferring/Ambulation- I  Managing Meds- I   Follow up appointments reviewed:    PCP Hospital f/u appt confirmed? No  , need to verify if a 20 min virtual visit is ok before scheduling HFU apt.   Specialist Hospital f/u appt confirmed? N/A   Are transportation arrangements needed? No   If their condition worsens, is the pt aware to call  their PCP or go to the ED? Yes  Was the patient provided with contact information for the PCP's office or ED? Yes  Was the  pt encouraged to call back with questions or concerns? Yes

## 2020-08-08 NOTE — Telephone Encounter (Signed)
20 minute virtual appt slot is fine. Thanks!

## 2020-08-08 NOTE — Telephone Encounter (Signed)
No HFU scheduled at this time. 

## 2020-08-08 NOTE — Telephone Encounter (Signed)
Spoke with pt who states she was checking in at this time for her last infusion. Pt requested a CB later. Note made.

## 2020-08-08 NOTE — Progress Notes (Signed)
  Diagnosis: COVID-19  Physician:Dr Delford Field  Procedure: [facility] Med: remdesivir infusion - Provided patient with remdesivir fact sheet for patients, parents and caregivers prior to infusion.  Complications: No immediate complications noted.  Discharge: Discharged home   Alison Landry 08/08/2020

## 2020-08-12 ENCOUNTER — Encounter: Payer: Self-pay | Admitting: Family Medicine

## 2020-08-13 ENCOUNTER — Other Ambulatory Visit: Payer: Self-pay | Admitting: Family Medicine

## 2020-08-13 DIAGNOSIS — G8921 Chronic pain due to trauma: Secondary | ICD-10-CM

## 2020-08-13 NOTE — Telephone Encounter (Signed)
Requested medication (s) are due for refill today: -  Requested medication (s) are on the active medication list: no  Last refill:  05/26/2018  Future visit scheduled: yes  Notes to clinic:  prescription expired 01/24/20   Requested Prescriptions  Pending Prescriptions Disp Refills   traMADol (ULTRAM) 50 MG tablet [Pharmacy Med Name: TRAMADOL 50MG  TABLETS] 90 tablet     Sig: TAKE 1 TABLET(50 MG) BY MOUTH THREE TIMES DAILY AS NEEDED      Not Delegated - Analgesics:  Opioid Agonists Failed - 08/13/2020  3:43 PM      Failed - This refill cannot be delegated      Failed - Urine Drug Screen completed in last 360 days.      Passed - Valid encounter within last 6 months    Recent Outpatient Visits           1 week ago Pneumonia due to COVID-19 virus   [facility], MD   6 months ago Acute cystitis without hematuria   [facility] [place], [place], [place]   8 months ago Hypertension, unspecified type   [facility] [place], [facility], FNP   9 months ago Diabetes mellitus without complication [facility])   [facility] [place], [facility], [place] [facility]   1 year ago Cough   [facility], MD       Future Appointments             In 2 days Fisher, Malva Limes, MD [facility], [facility]

## 2020-08-14 ENCOUNTER — Telehealth: Payer: Medicare Other | Admitting: Family Medicine

## 2020-08-14 MED ORDER — NYSTATIN 100000 UNIT/ML MT SUSP: 5.0000 mL | Freq: Four times a day (QID) | OROMUCOSAL | 0 refills | Status: DC

## 2020-08-15 ENCOUNTER — Encounter: Payer: Self-pay | Admitting: Family Medicine

## 2020-08-15 ENCOUNTER — Telehealth (INDEPENDENT_AMBULATORY_CARE_PROVIDER_SITE_OTHER): Payer: Medicare Other | Admitting: Family Medicine

## 2020-08-15 ENCOUNTER — Other Ambulatory Visit: Payer: Self-pay

## 2020-08-15 DIAGNOSIS — E1169 Type 2 diabetes mellitus with other specified complication: Secondary | ICD-10-CM | POA: Diagnosis not present

## 2020-08-15 DIAGNOSIS — U071 COVID-19: Secondary | ICD-10-CM

## 2020-08-15 DIAGNOSIS — J1282 Pneumonia due to coronavirus disease 2019: Secondary | ICD-10-CM

## 2020-08-15 DIAGNOSIS — E785 Hyperlipidemia, unspecified: Secondary | ICD-10-CM

## 2020-08-15 MED ORDER — HYDROCOD POLST-CPM POLST ER 10-8 MG/5ML PO SUER
5.0000 mL | Freq: Two times a day (BID) | ORAL | 0 refills | Status: DC | PRN
Start: 2020-08-15 — End: 2020-08-29

## 2020-08-15 NOTE — Progress Notes (Signed)
MyChart Video Visit    Virtual Visit via Video Note   This visit type was conducted due to national recommendations for restrictions regarding the COVID-19 Pandemic (e.g. social distancing) in an effort to limit this patient's exposure and mitigate transmission in our community. This patient is at least at moderate risk for complications without adequate follow up. This format is felt to be most appropriate for this patient at this time. Physical exam was limited by quality of the video and audio technology used for the visit.   Patient location: home Provider location: bfp  I discussed the limitations of evaluation and management by telemedicine and the availability of in person appointments. The patient expressed understanding and agreed to proceed.  Patient: Alison Landry   DOB: 15-Sep-1971   49 y.o. Female  MRN: [id number] Visit Date: 08/15/2020  Today's healthcare provider: Lelon Huh, MD   No chief complaint on file.  Subjective    HPI  Follow up Hospitalization  Patient was admitted to [facility] on 08/04/2020 and discharged on 08/07/2020. She was treated for pneumonia due to covid 19. Treatment for this included remdesivir and solumedrol. . She was discharged with prescription cough syrup and steroid taper.   She was taken off metformin and glipizide and started on levemir and Humalog which she was discharged on.   She reports excellent compliance with treatment.She reports this condition is improved.  Oxygen is staying around 95%. Still coughing a lot. Appetite is poor. Chest feels congested. Only short of breath with exertion.  Has completed prednisone. Started nystatin yesterday due to thrush.  Has cut Levemir back from 20 units to 10 units and sugars have gone up from 200s to the 400s. Also taking 8 units Humalog 8 units before meals. Is off metformin.     Medications: Outpatient Medications Prior to Visit  Medication Sig  . albuterol (VENTOLIN HFA) 108 (90 Base)  MCG/ACT inhaler Inhale 2 puffs into the lungs every 6 (six) hours as needed for wheezing or shortness of breath.  . ALPRAZolam (XANAX) 0.5 MG tablet Take 0.5-1 tablets (0.25-0.5 mg total) by mouth daily as needed for anxiety.  Marland Kitchen amLODipine (NORVASC) 10 MG tablet Take 1 tablet (10 mg total) by mouth daily.  Marland Kitchen ascorbic acid (VITAMIN C) 500 MG tablet Take 1 tablet (500 mg total) by mouth daily.  Marland Kitchen atorvastatin (LIPITOR) 10 MG tablet Take 1 tablet (10 mg total) by mouth daily.  . benzonatate (TESSALON) 100 MG capsule Take 100 mg by mouth 3 (three) times daily as needed.  . insulin detemir (LEVEMIR) 100 UNIT/ML FlexPen 20 units subcutaneous injection twice a day for a 3 days then decrease to 20 units daily afterwards  . insulin lispro (HUMALOG) 100 UNIT/ML KwikPen Inject 0.08 mLs (8 Units total) into the skin 3 (three) times daily.  Marland Kitchen nystatin (MYCOSTATIN) 100000 UNIT/ML suspension Take 5 mLs (500,000 Units total) by mouth 4 (four) times daily.  Marland Kitchen oxyCODONE (OXY IR/ROXICODONE) 5 MG immediate release tablet Take 1 and 1/2 three times a day. (Patient taking differently: Take 7.5 mg by mouth in the morning, at noon, and at bedtime. Take 1 and 1/2 three times a day.)  . traMADol (ULTRAM) 50 MG tablet TAKE 1 TABLET(50 MG) BY MOUTH THREE TIMES DAILY AS NEEDED  . blood glucose meter kit and supplies KIT Dispense based on patient and insurance preference. Use up to four times daily as directed. (FOR ICD-9 250.00, 250.01).  . Blood Glucose Monitoring Suppl (ONE TOUCH ULTRA 2) w/Device  KIT Use to check sugar daily for type 2 diabetes E11.9  . chlorpheniramine-HYDROcodone (TUSSIONEX) 10-8 MG/5ML SUER Take 5 mLs by mouth every 12 (twelve) hours as needed for cough. (Patient not taking: Reported on 08/15/2020)  . Insulin Pen Needle 32G X 4 MM MISC 1 Dose by Does not apply route 3 (three) times daily before meals.  . Lancets (ONETOUCH DELICA PLUS [id number]) MISC CHECK FASTING BLOOD SUGAR ONCE DAILY AS DIRECTED  .  metFORMIN (GLUCOPHAGE) 500 MG tablet TAKE 2 TABLETS BY MOUTH TWICE DAILY WITH A MEAL (Patient not taking: Reported on 08/15/2020)  . ondansetron (ZOFRAN ODT) 4 MG disintegrating tablet Take 1 tablet (4 mg total) by mouth every 8 (eight) hours as needed for nausea or vomiting.  Glory Rosebush ULTRA test strip CHECK FASTING BLOOD SUGAR DAILY  . predniSONE (DELTASONE) 20 MG tablet 2 tabs po daily for 6 days (Patient not taking: Reported on 08/15/2020)  . zinc sulfate 220 (50 Zn) MG capsule Take 1 capsule (220 mg total) by mouth daily.   No facility-administered medications prior to visit.      Objective    There were no vitals taken for this visit.  Physical Exam   Awake, alert, oriented x 3. In no apparent distress   Assessment & Plan     1. Pneumonia due to COVID-19 virus S/p hospitalization treated with remdesivir and solumedrol, completed outpatient course of prednisone and significantly improved. She is out cough syrup which is refilled today.   2. Type 2 diabetes mellitus with hyperlipidemia (HCC) Required initiation of insulin therapy with recent hospitalization. Currently off of glipizide and metformin. He renal functions were normal at discharge, so will restart metformin. She can reduce levemir from 10 units to 5 units once her fastings drop below 150. Continue 8 units Humalog AC for now. Anticipated restarting glipizide and weaning off insulin in several weeks. Virtual visit for follow up in 2 weeks.    No follow-ups on file.     I discussed the assessment and treatment plan with the patient. The patient was provided an opportunity to ask questions and all were answered. The patient agreed with the plan and demonstrated an understanding of the instructions.   The patient was advised to call back or seek an in-person evaluation if the symptoms worsen or if the condition fails to improve as anticipated.  I provided 15 minutes of non-face-to-face time during this encounter.  The  entirety of the information documented in the History of Present Illness, Review of Systems and Physical Exam were personally obtained by me. Portions of this information were initially documented by the CMA and reviewed by me for thoroughness and accuracy.     Lelon Huh, MD [facility] [phone] (phone) [phone] (fax)  [place]

## 2020-08-15 NOTE — Patient Instructions (Addendum)
.   Continue Levemir 10 units once a day. If your fasting sugar drops below 150, then you can reduce Levemir to 5 units once a day.   Continue Humalog 8 units before each meal   Start back on metformin at the same dose you were taking before Covid   Keep glipizide on hold for the time being   Let me know if you start having sugars over 500 again.

## 2020-08-17 ENCOUNTER — Other Ambulatory Visit: Payer: Self-pay | Admitting: Family Medicine

## 2020-08-17 NOTE — Telephone Encounter (Signed)
° °  Notes to clinic:  Patient requests 90 days supply   Requested Prescriptions  Pending Prescriptions Disp Refills   insulin lispro (HUMALOG) 100 UNIT/ML KwikPen [Pharmacy Med Name: INSULIN LISPRO 100U/ML KWIKPEN 3ML] 21 mL     Sig: INJECT 8 UNITS UNDER THE SKIN THREE TIMES DAILY      Endocrinology:  Diabetes - Insulins Failed - 08/17/2020  2:38 PM      Failed - HBA1C is between 0 and 7.9 and within 180 days    Hemoglobin A1C  Date Value Ref Range Status  06/17/2014 5.5  Final   Hgb A1c MFr Bld  Date Value Ref Range Status  08/07/2020 9.3 (H) 4.8 - 5.6 % Final    Comment:    (NOTE) Pre diabetes:          5.7%-6.4%  Diabetes:              >6.4%  Glycemic control for   <7.0% adults with diabetes           Passed - Valid encounter within last 6 months    Recent Outpatient Visits           2 days ago Pneumonia due to COVID-19 virus   [facility] Malva Limes, MD   2 weeks ago Pneumonia due to COVID-19 virus   [facility] Malva Limes, MD   6 months ago Acute cystitis without hematuria   [facility] Chrismon, [place] E, [place]   9 months ago Hypertension, unspecified type   [employer], Eula Fried, FNP   10 months ago Diabetes mellitus without complication [facility])   [facility] Chrismon, Jodell Cipro, [place]       Future Appointments             In 1 week Fisher, Demetrios Isaacs, MD [facility]

## 2020-08-18 ENCOUNTER — Other Ambulatory Visit: Payer: Self-pay | Admitting: Family Medicine

## 2020-08-18 DIAGNOSIS — G8921 Chronic pain due to trauma: Secondary | ICD-10-CM

## 2020-08-18 MED ORDER — OXYCODONE HCL 5 MG PO TABS: 7.5000 mg | ORAL_TABLET | Freq: Three times a day (TID) | ORAL | 0 refills | Status: DC

## 2020-08-18 NOTE — Telephone Encounter (Signed)
Medication Refill - Medication: oxycodone   Has the patient contacted their pharmacy? Yes.   (Agent: If no, request that the patient contact the pharmacy for the refill.) (Agent: If yes, when and what did the pharmacy advise?)  Preferred Pharmacy (with phone number or street name):  [facility][id number] Nicholes Rough, [place] - [address] AT [facility] OF [address]  [address] [place] [address]  Phone: [phone] Fax: [phone]  Hours: Not open 24 hours     Agent: Please be advised that RX refills may take up to 3 business days. We ask that you follow-up with your pharmacy.

## 2020-08-18 NOTE — Telephone Encounter (Signed)
Requested medication (s) are due for refill today:  yes  Requested medication (s) are on the active medication list: yes  Last refill:  07/20/2020  Future visit scheduled: yes  Notes to clinic:  this refill cannot be delegated    Requested Prescriptions  Pending Prescriptions Disp Refills   oxyCODONE (OXY IR/ROXICODONE) 5 MG immediate release tablet 135 tablet 0    Sig: Take 1 and 1/2 three times a day.      Not Delegated - Analgesics:  Opioid Agonists Failed - 08/18/2020  9:39 AM      Failed - This refill cannot be delegated      Failed - Urine Drug Screen completed in last 360 days.      Passed - Valid encounter within last 6 months    Recent Outpatient Visits           3 days ago Pneumonia due to COVID-19 virus   [facility] Malva Limes, MD   2 weeks ago Pneumonia due to COVID-19 virus   [facility] Malva Limes, MD   6 months ago Acute cystitis without hematuria   [facility] Chrismon, Jodell Cipro, [place]   9 months ago Hypertension, unspecified type   [facility] Flinchum, Eula Fried, FNP   10 months ago Diabetes mellitus without complication [facility])   [facility] Chrismon, Jodell Cipro, [place]       Future Appointments             In 1 week Fisher, Demetrios Isaacs, MD [facility]

## 2020-08-20 ENCOUNTER — Other Ambulatory Visit: Payer: Self-pay | Admitting: Family Medicine

## 2020-08-20 DIAGNOSIS — G8921 Chronic pain due to trauma: Secondary | ICD-10-CM

## 2020-08-20 NOTE — Telephone Encounter (Signed)
Requested medication (s) are due for refill today: yes  Requested medication (s) are on the active medication list: NO  Last refill:  03/02/20  Future visit scheduled: Yes  Notes to clinic:  this med not delegated to NT to RF   Requested Prescriptions  Pending Prescriptions Disp Refills   tiZANidine (ZANAFLEX) 4 MG tablet [Pharmacy Med Name: TIZANIDINE 4MG  TABLETS] 120 tablet 1    Sig: TAKE 1 TABLET BY MOUTH EVERY 4 TO 6 HOURS AS NEEDED      Not Delegated - Cardiovascular:  Alpha-2 Agonists - tizanidine Failed - 08/20/2020  2:34 PM      Failed - This refill cannot be delegated      Passed - Valid encounter within last 6 months    Recent Outpatient Visits           5 days ago Pneumonia due to COVID-19 virus   [facility], MD   2 weeks ago Pneumonia due to COVID-19 virus   [facility], MD   6 months ago Acute cystitis without hematuria   [facility] Chrismon, [place] E, [place]   9 months ago Hypertension, unspecified type   [facility] Flinchum, [facility], FNP   10 months ago Diabetes mellitus without complication [facility])   [facility] Chrismon, [facility], Jodell Cipro       Future Appointments             In 1 week Fisher, [place], MD [facility], [facility]

## 2020-08-23 ENCOUNTER — Telehealth: Payer: Self-pay | Admitting: Family Medicine

## 2020-08-23 NOTE — Telephone Encounter (Signed)
Pt called in to ask if provider would send the Rx for cough syrup to her local pharmacy instead?   Pt says that she requested a Rx when she was out of town and it was sent, pt says that the pharmacy didn't have medication in stock because they are going out of business.    Please assist.

## 2020-08-29 ENCOUNTER — Telehealth (INDEPENDENT_AMBULATORY_CARE_PROVIDER_SITE_OTHER): Payer: Medicare Other | Admitting: Family Medicine

## 2020-08-29 DIAGNOSIS — E785 Hyperlipidemia, unspecified: Secondary | ICD-10-CM

## 2020-08-29 DIAGNOSIS — J1282 Pneumonia due to coronavirus disease 2019: Secondary | ICD-10-CM | POA: Diagnosis not present

## 2020-08-29 DIAGNOSIS — U071 COVID-19: Secondary | ICD-10-CM

## 2020-08-29 DIAGNOSIS — E1169 Type 2 diabetes mellitus with other specified complication: Secondary | ICD-10-CM | POA: Diagnosis not present

## 2020-08-29 DIAGNOSIS — R059 Cough, unspecified: Secondary | ICD-10-CM

## 2020-08-29 DIAGNOSIS — R05 Cough: Secondary | ICD-10-CM | POA: Diagnosis not present

## 2020-08-29 MED ORDER — ALBUTEROL SULFATE HFA 108 (90 BASE) MCG/ACT IN AERS: 2.0000 | INHALATION_SPRAY | Freq: Four times a day (QID) | RESPIRATORY_TRACT | 0 refills | Status: DC | PRN

## 2020-08-29 MED ORDER — GLIPIZIDE 10 MG PO TABS: 10.0000 mg | ORAL_TABLET | Freq: Every day | ORAL | 3 refills | Status: DC

## 2020-08-29 NOTE — Progress Notes (Signed)
MyChart Video Visit    Virtual Visit via Video Note   This visit type was conducted due to national recommendations for restrictions regarding the COVID-19 Pandemic (e.g. social distancing) in an effort to limit this patient's exposure and mitigate transmission in our community. This patient is at least at moderate risk for complications without adequate follow up. This format is felt to be most appropriate for this patient at this time. Physical exam was limited by quality of the video and audio technology used for the visit.   Patient location: home Provider location: bfp  I discussed the limitations of evaluation and management by telemedicine and the availability of in person appointments. The patient expressed understanding and agreed to proceed.  Patient: Alison Landry   DOB: 06-Sep-1971   49 y.o. Female  MRN: [id number] Visit Date: 08/29/2020  Today's healthcare provider: Lelon Huh, MD   No chief complaint on file.  Subjective    HPI   Diabetes Mellitus Type II, Follow-up  Lab Results  Component Value Date   HGBA1C 9.3 (H) 08/07/2020   HGBA1C 7.1 (H) 01/25/2020   HGBA1C 9.4 (H) 10/22/2019   Wt Readings from Last 3 Encounters:  08/05/20 191 lb 12.8 oz (87 kg)  05/24/20 200 lb (90.7 kg)  01/24/20 199 lb (90.3 kg)   Last seen for diabetes 2 weeks ago.  Management since then includes restarting metformin. reduce levemir from 10 units to 5 units once her fastings drop below 150. Continue 8 units Humalog AC for now. Anticipated restarting glipizide and weaning off insulin in several weeks. . She reports good compliance with treatment. She is not having side effects.     ---------------------------------------------------------------------------------------------------   Pneumonia due to COVID-19 virus- 08/15/20 S/p hospitalization treated with remdesivir and solumedrol. Her cough continues to improve and is now mostly resolved. She still feels fatigued, but  breathing about back to normal.    Medications: Outpatient Medications Prior to Visit  Medication Sig  . traMADol (ULTRAM) 50 MG tablet TAKE 1 TABLET(50 MG) BY MOUTH THREE TIMES DAILY AS NEEDED  . albuterol (VENTOLIN HFA) 108 (90 Base) MCG/ACT inhaler Inhale 2 puffs into the lungs every 6 (six) hours as needed for wheezing or shortness of breath.  . ALPRAZolam (XANAX) 0.5 MG tablet Take 0.5-1 tablets (0.25-0.5 mg total) by mouth daily as needed for anxiety.  Marland Kitchen amLODipine (NORVASC) 10 MG tablet Take 1 tablet (10 mg total) by mouth daily.  Marland Kitchen ascorbic acid (VITAMIN C) 500 MG tablet Take 1 tablet (500 mg total) by mouth daily.  Marland Kitchen atorvastatin (LIPITOR) 10 MG tablet Take 1 tablet (10 mg total) by mouth daily.  . benzonatate (TESSALON) 100 MG capsule Take 100 mg by mouth 3 (three) times daily as needed.  . blood glucose meter kit and supplies KIT Dispense based on patient and insurance preference. Use up to four times daily as directed. (FOR ICD-9 250.00, 250.01).  . Blood Glucose Monitoring Suppl (ONE TOUCH ULTRA 2) w/Device KIT Use to check sugar daily for type 2 diabetes E11.9  . chlorpheniramine-HYDROcodone (TUSSIONEX) 10-8 MG/5ML SUER Take 5 mLs by mouth every 12 (twelve) hours as needed for cough.  . insulin detemir (LEVEMIR) 100 UNIT/ML FlexPen 20 units subcutaneous injection twice a day for a 3 days then decrease to 20 units daily afterwards  . insulin lispro (HUMALOG) 100 UNIT/ML KwikPen INJECT 8 UNITS UNDER THE SKIN THREE TIMES DAILY  . Insulin Pen Needle 32G X 4 MM MISC 1 Dose by Does not apply  route 3 (three) times daily before meals.  . Lancets (ONETOUCH DELICA PLUS [id number]) MISC CHECK FASTING BLOOD SUGAR ONCE DAILY AS DIRECTED  . metFORMIN (GLUCOPHAGE) 500 MG tablet TAKE 2 TABLETS BY MOUTH TWICE DAILY WITH A MEAL (Patient not taking: Reported on 08/15/2020)  . nystatin (MYCOSTATIN) 100000 UNIT/ML suspension Take 5 mLs (500,000 Units total) by mouth 4 (four) times daily.  . ondansetron  (ZOFRAN ODT) 4 MG disintegrating tablet Take 1 tablet (4 mg total) by mouth every 8 (eight) hours as needed for nausea or vomiting.  Glory Rosebush ULTRA test strip CHECK FASTING BLOOD SUGAR DAILY  . oxyCODONE (OXY IR/ROXICODONE) 5 MG immediate release tablet Take 1.5 tablets (7.5 mg total) by mouth 3 (three) times daily.  Marland Kitchen tiZANidine (ZANAFLEX) 4 MG tablet TAKE 1 TABLET BY MOUTH EVERY 4 TO 6 HOURS AS NEEDED  . zinc sulfate 220 (50 Zn) MG capsule Take 1 capsule (220 mg total) by mouth daily.   No facility-administered medications prior to visit.    Review of Systems    Objective    There were no vitals taken for this visit.   Physical Exam   Awake, alert, oriented x 3. In no apparent distress   Assessment & Plan     1. Pneumonia due to COVID-19 virus   2. Cough Steadily improving since hospital discharge. Refilled albuterol in inhaler today.   3. Type 2 diabetes mellitus with hyperlipidemia (HCC) Insulin requiring since contracting Covid. Is now back on metformin. Was previously on glipizide. Start on - glipiZIDE (GLUCOTROL) 10 MG tablet; Take 1 tablet (10 mg total) by mouth daily before breakfast.  Dispense: 30 tablet; Refill: 3  May continue to wean Lantus if fastings below 120.    Follow up in office in a month    I discussed the assessment and treatment plan with the patient. The patient was provided an opportunity to ask questions and all were answered. The patient agreed with the plan and demonstrated an understanding of the instructions.   The patient was advised to call back or seek an in-person evaluation if the symptoms worsen or if the condition fails to improve as anticipated.  I provided 12 minutes of non-face-to-face time during this encounter.  The entirety of the information documented in the History of Present Illness, Review of Systems and Physical Exam were personally obtained by me. Portions of this information were initially documented by the CMA and  reviewed by me for thoroughness and accuracy.     Lelon Huh, MD [facility] [phone] (phone) [phone] (fax)  [place]

## 2020-08-29 NOTE — Patient Instructions (Signed)
.   Please review the attached list of medications and notify my office if there are any errors.   . Please bring all of your medications to every appointment so we can make sure that our medication list is the same as yours.   

## 2020-08-30 ENCOUNTER — Telehealth: Payer: Self-pay | Admitting: Family Medicine

## 2020-08-31 ENCOUNTER — Other Ambulatory Visit: Payer: Self-pay | Admitting: Family Medicine

## 2020-09-07 ENCOUNTER — Other Ambulatory Visit: Payer: Self-pay | Admitting: Family Medicine

## 2020-09-07 MED ORDER — INSULIN DETEMIR 100 UNIT/ML FLEXPEN
PEN_INJECTOR | SUBCUTANEOUS | 0 refills | Status: DC
Start: 2020-09-07 — End: 2021-02-08

## 2020-09-07 MED ORDER — ONETOUCH ULTRA VI STRP
ORAL_STRIP | 4 refills | Status: DC
Start: 2020-09-07 — End: 2021-01-27

## 2020-09-07 NOTE — Telephone Encounter (Signed)
insulin detemir (LEVEMIR) 100 UNIT/ML FlexPen Medication Date: 08/07/2020 Department: [facility] (1C) Ordering/Authorizing: Alford Highland, MD   Lancets [facility] [place] PLUS [place]) MISC Medication Date: 04/11/2020 Department: [facility] Ordering/Authorizing: Chrismon, Jodell Cipro, PA   Pt states that she is almost out of Levemir that was prescribed in hospital and states they said Dr Sherrie Mustache should include that as provider issued. Pt also is out of lancets.  [facility] #[id number] Nicholes Rough, [place] - [address] AT [facility] OF [address] Phone:  [phone]  Fax:  [phone]

## 2020-09-15 ENCOUNTER — Other Ambulatory Visit: Payer: Self-pay | Admitting: Family Medicine

## 2020-09-15 DIAGNOSIS — G8921 Chronic pain due to trauma: Secondary | ICD-10-CM

## 2020-09-15 NOTE — Telephone Encounter (Signed)
Medication Refill - Medication: Oxycodone 5 mg  Has the patient contacted their pharmacy? No. (Agent: If no, request that the patient contact the pharmacy for the refill.) (Agent: If yes, when and what did the pharmacy advise?)  Preferred Pharmacy (with phone number or street name): [facility] [address]  Agent: Please be advised that RX refills may take up to 3 business days. We ask that you follow-up with your pharmacy.

## 2020-09-15 NOTE — Telephone Encounter (Signed)
Requested medication (s) are due for refill today: yes  Requested medication (s) are on the active medication list: yes  Last refill: 08/18/20  Future visit scheduled: no  Notes to clinic: not delegated    Requested Prescriptions  Pending Prescriptions Disp Refills   oxyCODONE (OXY IR/ROXICODONE) 5 MG immediate release tablet 135 tablet 0    Sig: Take 1.5 tablets (7.5 mg total) by mouth 3 (three) times daily.      Not Delegated - Analgesics:  Opioid Agonists Failed - 09/15/2020 11:27 AM      Failed - This refill cannot be delegated      Failed - Urine Drug Screen completed in last 360 days.      Passed - Valid encounter within last 6 months    Recent Outpatient Visits           2 weeks ago Pneumonia due to COVID-19 virus   [facility] Malva Limes, MD   1 month ago Pneumonia due to COVID-19 virus   [place] [facility] Malva Limes, MD   1 month ago Pneumonia due to COVID-19 virus   [facility] Malva Limes, MD   7 months ago Acute cystitis without hematuria   [facility] Chrismon, [place] E, [place]   10 months ago Hypertension, unspecified type   [facility] Flinchum, Eula Fried, FNP

## 2020-09-18 MED ORDER — OXYCODONE HCL 5 MG PO TABS: 7.5000 mg | ORAL_TABLET | Freq: Three times a day (TID) | ORAL | 0 refills | Status: DC

## 2020-10-05 ENCOUNTER — Other Ambulatory Visit: Payer: Self-pay

## 2020-10-05 ENCOUNTER — Encounter: Payer: Self-pay | Admitting: Family Medicine

## 2020-10-16 ENCOUNTER — Other Ambulatory Visit: Payer: Self-pay | Admitting: Family Medicine

## 2020-10-16 DIAGNOSIS — G8921 Chronic pain due to trauma: Secondary | ICD-10-CM

## 2020-10-16 MED ORDER — OXYCODONE HCL 5 MG PO TABS: 7.5000 mg | ORAL_TABLET | Freq: Three times a day (TID) | ORAL | 0 refills | Status: DC

## 2020-10-16 NOTE — Telephone Encounter (Signed)
oxyCODONE (OXY IR/ROXICODONE) 5 MG immediate release tablet Medication Date: 09/18/2020 Department: [facility] Ordering/Authorizing: Malva Limes, MD   [facility] [phone] Nicholes Rough, [place] - [address] AT [facility] OF [address] Phone:  [phone]  Fax:  [phone]     Pt also has an appt for tomorrow.

## 2020-10-16 NOTE — Telephone Encounter (Signed)
Requested medication (s) are due for refill today: yes  Requested medication (s) are on the active medication list: yes  Last refill:  09/18/20 #135  Future visit scheduled: yes  Notes to clinic:  Please review for refill. Refill not delegated per protocol    Requested Prescriptions  Pending Prescriptions Disp Refills   oxyCODONE (OXY IR/ROXICODONE) 5 MG immediate release tablet 135 tablet 0    Sig: Take 1.5 tablets (7.5 mg total) by mouth 3 (three) times daily.      Not Delegated - Analgesics:  Opioid Agonists Failed - 10/16/2020 10:47 AM      Failed - This refill cannot be delegated      Failed - Urine Drug Screen completed in last 360 days      Passed - Valid encounter within last 6 months    Recent Outpatient Visits           1 month ago Pneumonia due to COVID-19 virus   [facility] Malva Limes, MD   2 months ago Pneumonia due to COVID-19 virus   [facility] Malva Limes, MD   2 months ago Pneumonia due to COVID-19 virus   [facility] Malva Limes, MD   8 months ago Acute cystitis without hematuria   [facility] Chrismon, [place], [place]   11 months ago Hypertension, unspecified type   [employer], Eula Fried, FNP       Future Appointments             Tomorrow Fisher, Demetrios Isaacs, MD [facility]

## 2020-10-17 ENCOUNTER — Ambulatory Visit: Payer: Medicare Other | Admitting: Family Medicine

## 2020-10-17 NOTE — Progress Notes (Deleted)
Established patient visit   Patient: Alison Landry   DOB: 10-04-1971   49 y.o. Female  MRN: [id number] Visit Date: 10/17/2020  Today's healthcare provider: Lelon Huh, MD   No chief complaint on file.  Subjective    HPI  Diabetes Mellitus Type II, Follow-up  Lab Results  Component Value Date   HGBA1C 9.3 (H) 08/07/2020   HGBA1C 7.1 (H) 01/25/2020   HGBA1C 9.4 (H) 10/22/2019   Wt Readings from Last 3 Encounters:  08/05/20 191 lb 12.8 oz (87 kg)  05/24/20 200 lb (90.7 kg)  01/24/20 199 lb (90.3 kg)   Last seen for diabetes 08/29/2020. Management since then includes restarting Glipizide. May continue to wean Lantus if fastings below 120. She reports {excellent/good/fair/poor:19665} compliance with treatment. She {is/is not:21021397} having side effects. {document side effects if present:1} Symptoms: {Yes/No:20286} fatigue {Yes/No:20286} foot ulcerations  {Yes/No:20286} appetite changes {Yes/No:20286} nausea  {Yes/No:20286} paresthesia of the feet  {Yes/No:20286} polydipsia  {Yes/No:20286} polyuria {Yes/No:20286} visual disturbances   {Yes/No:20286} vomiting     Home blood sugar records: {diabetes glucometry results:16657}  Episodes of hypoglycemia? {Yes/No:20286} {enter symptoms and frequency of symptoms if yes:1}   Current insulin regiment: {enter 'none' or type of insulin and number of units taken with each dose of each insulin formulation that the patient is taking:1} Most Recent Eye Exam: not UTD {Current exercise:16438:::1} {Current diet habits:16563:::1}  Pertinent Labs: Lab Results  Component Value Date   CHOL 226 (H) 01/25/2020   HDL 33 (L) 01/25/2020   LDLCALC 94 01/25/2020   TRIG 596 (HH) 01/25/2020   CHOLHDL 6.8 (H) 01/25/2020   Lab Results  Component Value Date   NA 132 (L) 08/07/2020   K 4.9 08/07/2020   CREATININE 0.72 08/07/2020   GFRNONAA >60 08/07/2020   GFRAA >60 08/07/2020   GLUCOSE 536 ([place]) 08/07/2020      ---------------------------------------------------------------------------------------------------  {Show patient history (optional):23778::" "}   Medications: Outpatient Medications Prior to Visit  Medication Sig  . traMADol (ULTRAM) 50 MG tablet TAKE 1 TABLET(50 MG) BY MOUTH THREE TIMES DAILY AS NEEDED  . albuterol (VENTOLIN HFA) 108 (90 Base) MCG/ACT inhaler INHALE 2 PUFFS INTO THE LUNGS EVERY 6 HOURS AS NEEDED FOR WHEEZING OR SHORTNESS OF BREATH  . ALPRAZolam (XANAX) 0.5 MG tablet Take 0.5-1 tablets (0.25-0.5 mg total) by mouth daily as needed for anxiety.  Marland Kitchen amLODipine (NORVASC) 10 MG tablet Take 1 tablet (10 mg total) by mouth daily.  Marland Kitchen ascorbic acid (VITAMIN C) 500 MG tablet Take 1 tablet (500 mg total) by mouth daily.  Marland Kitchen atorvastatin (LIPITOR) 10 MG tablet Take 1 tablet (10 mg total) by mouth daily.  . BD PEN NEEDLE NANO 2ND GEN 32G X 4 MM MISC USE THREE TIMES DAILY AS DIRECTED  . benzonatate (TESSALON) 100 MG capsule Take 100 mg by mouth 3 (three) times daily as needed.  . blood glucose meter kit and supplies KIT Dispense based on patient and insurance preference. Use up to four times daily as directed. (FOR ICD-9 250.00, 250.01).  . Blood Glucose Monitoring Suppl (ONE TOUCH ULTRA 2) w/Device KIT Use to check sugar daily for type 2 diabetes E11.9  . glipiZIDE (GLUCOTROL) 10 MG tablet Take 1 tablet (10 mg total) by mouth daily before breakfast.  . glucose blood (ONETOUCH ULTRA) test strip Use as instructed to check blood sugar three times daily for insulin dependent diabetes  . insulin detemir (LEVEMIR) 100 UNIT/ML FlexPen 20 units subcutaneous injection twice a day for a 3 days  then decrease to 20 units daily afterwards  . insulin lispro (HUMALOG) 100 UNIT/ML KwikPen INJECT 8 UNITS UNDER THE SKIN THREE TIMES DAILY  . Lancets (ONETOUCH DELICA PLUS [id number]) MISC CHECK FASTING BLOOD SUGAR ONCE DAILY AS DIRECTED  . metFORMIN (GLUCOPHAGE) 500 MG tablet TAKE 2 TABLETS BY MOUTH TWICE  DAILY WITH A MEAL (Patient not taking: Reported on 08/15/2020)  . nystatin (MYCOSTATIN) 100000 UNIT/ML suspension Take 5 mLs (500,000 Units total) by mouth 4 (four) times daily.  . ondansetron (ZOFRAN ODT) 4 MG disintegrating tablet Take 1 tablet (4 mg total) by mouth every 8 (eight) hours as needed for nausea or vomiting.  Marland Kitchen oxyCODONE (OXY IR/ROXICODONE) 5 MG immediate release tablet Take 1.5 tablets (7.5 mg total) by mouth 3 (three) times daily.  Marland Kitchen tiZANidine (ZANAFLEX) 4 MG tablet TAKE 1 TABLET BY MOUTH EVERY 4 TO 6 HOURS AS NEEDED  . zinc sulfate 220 (50 Zn) MG capsule Take 1 capsule (220 mg total) by mouth daily.   No facility-administered medications prior to visit.    Review of Systems  {Heme  Chem  Endocrine  Serology  Results Review (optional):23779::" "}  Objective    There were no vitals taken for this visit. {Show previous vital signs (optional):23777::" "}  Physical Exam  ***  No results found for any visits on 10/17/20.  Assessment & Plan     ***  No follow-ups on file.      {provider attestation***:1}   Lelon Huh, MD  [facility] [phone] (phone) [phone] (fax)  [place]

## 2020-10-25 ENCOUNTER — Ambulatory Visit: Payer: Medicare Other | Admitting: Family Medicine

## 2020-10-31 ENCOUNTER — Ambulatory Visit (INDEPENDENT_AMBULATORY_CARE_PROVIDER_SITE_OTHER): Payer: Medicare Other | Admitting: Family Medicine

## 2020-10-31 ENCOUNTER — Other Ambulatory Visit: Payer: Self-pay

## 2020-10-31 ENCOUNTER — Encounter: Payer: Self-pay | Admitting: Family Medicine

## 2020-10-31 VITALS — BP 123/88 | HR 101 | Temp 98.2°F | Resp 16 | Wt 204.0 lb

## 2020-10-31 DIAGNOSIS — I10 Essential (primary) hypertension: Secondary | ICD-10-CM | POA: Diagnosis not present

## 2020-10-31 DIAGNOSIS — E1169 Type 2 diabetes mellitus with other specified complication: Secondary | ICD-10-CM

## 2020-10-31 DIAGNOSIS — R748 Abnormal levels of other serum enzymes: Secondary | ICD-10-CM

## 2020-10-31 DIAGNOSIS — Z125 Encounter for screening for malignant neoplasm of prostate: Secondary | ICD-10-CM

## 2020-10-31 DIAGNOSIS — M25551 Pain in right hip: Secondary | ICD-10-CM | POA: Diagnosis not present

## 2020-10-31 DIAGNOSIS — Z8616 Personal history of COVID-19: Secondary | ICD-10-CM

## 2020-10-31 DIAGNOSIS — E785 Hyperlipidemia, unspecified: Secondary | ICD-10-CM

## 2020-10-31 DIAGNOSIS — M25552 Pain in left hip: Secondary | ICD-10-CM

## 2020-10-31 NOTE — Progress Notes (Signed)
Established patient visit   Patient: Alison Landry   DOB: Jan 07, 1971   49 y.o. Female  MRN: [id number] Visit Date: 10/31/2020  Today's healthcare provider: Lelon Huh, MD   Chief Complaint  Patient presents with  . Diabetes   Subjective    HPI  Diabetes Mellitus Type II, Follow-up  Recent Labs       Lab Results  Component Value Date   HGBA1C 9.3 (H) 08/07/2020   HGBA1C 7.1 (H) 01/25/2020   HGBA1C 9.4 (H) 10/22/2019        Wt Readings from Last 3 Encounters:  08/05/20 191 lb 12.8 oz (87 kg)  05/24/20 200 lb (90.7 kg)  01/24/20 199 lb (90.3 kg)   Symptoms: No fatigue No foot ulcerations  No appetite changes No nausea  No paresthesia of the feet  No polydipsia  No polyuria No visual disturbances   No vomiting     Home blood sugar records: fasting range: 150-300  Episodes of hypoglycemia? No           Current insulin regiment: Humalog 8 units 1-2 times daily, and Levemir 2 (two) units daily Most Recent Eye Exam: not UTD Current exercise: none Current diet habits: on average, 1-2 meals per day  Pertinent Labs: Recent Labs         Lab Results  Component Value Date   CHOL 226 (H) 01/25/2020   HDL 33 (L) 01/25/2020   LDLCALC 94 01/25/2020   TRIG 596 (HH) 01/25/2020   CHOLHDL 6.8 (H) 01/25/2020     Recent Labs[] Expand by Default         Lab Results  Component Value Date   NA 132 (L) 08/07/2020   K 4.9 08/07/2020   CREATININE 0.72 08/07/2020   GFRNONAA >60 08/07/2020   GFRAA >60 08/07/2020   GLUCOSE 536 ([place]) 08/07/2020       She was not on insulin for diabetes until her hospitalization for Covid at the end of August.       Medications: Outpatient Medications Prior to Visit  Medication Sig  . albuterol (VENTOLIN HFA) 108 (90 Base) MCG/ACT inhaler INHALE 2 PUFFS INTO THE LUNGS EVERY 6 HOURS AS NEEDED FOR WHEEZING OR SHORTNESS OF BREATH  . ALPRAZolam (XANAX) 0.5 MG tablet Take 0.5-1 tablets (0.25-0.5 mg total) by  mouth daily as needed for anxiety.  Marland Kitchen amLODipine (NORVASC) 10 MG tablet Take 1 tablet (10 mg total) by mouth daily.  Marland Kitchen ascorbic acid (VITAMIN C) 500 MG tablet Take 1 tablet (500 mg total) by mouth daily.  Marland Kitchen atorvastatin (LIPITOR) 10 MG tablet Take 1 tablet (10 mg total) by mouth daily.  . BD PEN NEEDLE NANO 2ND GEN 32G X 4 MM MISC USE THREE TIMES DAILY AS DIRECTED  . blood glucose meter kit and supplies KIT Dispense based on patient and insurance preference. Use up to four times daily as directed. (FOR ICD-9 250.00, 250.01).  . Blood Glucose Monitoring Suppl (ONE TOUCH ULTRA 2) w/Device KIT Use to check sugar daily for type 2 diabetes E11.9  . glipiZIDE (GLUCOTROL) 10 MG tablet Take 1 tablet (10 mg total) by mouth daily before breakfast.  . glucose blood (ONETOUCH ULTRA) test strip Use as instructed to check blood sugar three times daily for insulin dependent diabetes  . insulin detemir (LEVEMIR) 100 UNIT/ML FlexPen 20 units subcutaneous injection twice a day for a 3 days then decrease to 20 units daily afterwards  . insulin lispro (HUMALOG) 100 UNIT/ML KwikPen INJECT 8 UNITS UNDER  THE SKIN THREE TIMES DAILY  . Lancets (ONETOUCH DELICA PLUS [id number]) MISC CHECK FASTING BLOOD SUGAR ONCE DAILY AS DIRECTED  . metFORMIN (GLUCOPHAGE) 500 MG tablet TAKE 2 TABLETS BY MOUTH TWICE DAILY WITH A MEAL  . nystatin (MYCOSTATIN) 100000 UNIT/ML suspension Take 5 mLs (500,000 Units total) by mouth 4 (four) times daily.  . ondansetron (ZOFRAN ODT) 4 MG disintegrating tablet Take 1 tablet (4 mg total) by mouth every 8 (eight) hours as needed for nausea or vomiting.  Marland Kitchen oxyCODONE (OXY IR/ROXICODONE) 5 MG immediate release tablet Take 1.5 tablets (7.5 mg total) by mouth 3 (three) times daily.  Marland Kitchen tiZANidine (ZANAFLEX) 4 MG tablet TAKE 1 TABLET BY MOUTH EVERY 4 TO 6 HOURS AS NEEDED  . traMADol (ULTRAM) 50 MG tablet TAKE 1 TABLET(50 MG) BY MOUTH THREE TIMES DAILY AS NEEDED  . zinc sulfate 220 (50 Zn) MG capsule Take 1  capsule (220 mg total) by mouth daily.  . benzonatate (TESSALON) 100 MG capsule Take 100 mg by mouth 3 (three) times daily as needed. (Patient not taking: Reported on 10/31/2020)   No facility-administered medications prior to visit.    Review of Systems  Constitutional: Negative for appetite change, chills, fatigue and fever.  Respiratory: Negative for chest tightness and shortness of breath.   Cardiovascular: Negative for chest pain and palpitations.  Gastrointestinal: Negative for abdominal pain, nausea and vomiting.  Neurological: Negative for dizziness and weakness.      Objective    BP 123/88 (BP Location: Right Arm, Patient Position: Sitting)   Pulse (!) 101   Temp 98.2 F (36.8 C) (Oral)   Resp 16   Wt 204 lb (92.5 kg)   BMI 35.02 kg/m    Physical Exam    General: Appearance:    Obese female in no acute distress  Eyes:    PERRL, conjunctiva/corneas clear, EOM's intact       Lungs:     Clear to auscultation bilaterally, respirations unlabored  Heart:    Tachycardic. Normal rhythm. No murmurs, rubs, or gallops.   MS:   All extremities are intact.   Neurologic:   Awake, alert, oriented x 3. No apparent focal neurological           defect.          Assessment & Plan     1. Type 2 diabetes mellitus with hyperlipidemia ([place]) She thought she was taking 20 units of Levemir daily, but when she demonstrated to me what she was dialing on the pen, she is actually only taking 2 units. She was no on insuline prior to Covid hospitalization in august. Consider adding a SGLP-1 antagonist to replace Levemir. Continue pre-prandial Humalog for the time being. See how labs look first.  - Hemoglobin A1c - TSH  2. Essential hypertension Continue current medications.   - Comprehensive metabolic panel - Lipid panel - TSH  3. Elevated lipase Due to recheck.  - Lipase  4. History of COVID-19 Now 3 months out with respiratory sx resolved. Recommend she get Covid vaccine to lower  risk of second infection.   5. Bilateral hip pain This really only bothers her when she gets up in the morning.recommend she start walking daily for exercise and can take OTC NSAID before going to bed at night.         The entirety of the information documented in the History of Present Illness, Review of Systems and Physical Exam were personally obtained by me. Portions of this information were  initially documented by the CMA and reviewed by me for thoroughness and accuracy.      Lelon Huh, MD  [facility] [phone] (phone) [phone] (fax)  [place]

## 2020-10-31 NOTE — Patient Instructions (Addendum)
I recommend that you get a flu vaccine this year. Please call our office at [phone] at your earliest convenience to schedule a flu shot.   . You can take 2 OTC Aleve before bed every night to help with hip pain and stiffness. Also try to work up to 30 minutes of brisk walking every day  . Please go to the lab draw station in [address] on the second floor of [facility]  when you are fasting for 8 hours. Normal hours are 8:00am to 11:30am and 1:00pm to 4:00pm Monday through Friday

## 2020-11-01 ENCOUNTER — Other Ambulatory Visit: Payer: Self-pay | Admitting: Family Medicine

## 2020-11-01 DIAGNOSIS — E1169 Type 2 diabetes mellitus with other specified complication: Secondary | ICD-10-CM | POA: Diagnosis not present

## 2020-11-01 DIAGNOSIS — G8921 Chronic pain due to trauma: Secondary | ICD-10-CM

## 2020-11-01 DIAGNOSIS — E785 Hyperlipidemia, unspecified: Secondary | ICD-10-CM | POA: Diagnosis not present

## 2020-11-01 DIAGNOSIS — I10 Essential (primary) hypertension: Secondary | ICD-10-CM | POA: Diagnosis not present

## 2020-11-01 NOTE — Telephone Encounter (Signed)
Requested medication (s) are due for refill today: yes   Requested medication (s) are on the active medication list: yes  Last refill: 10/07/2020  Future visit scheduled: no  Notes to clinic:  this refill cannot be delegated    Requested Prescriptions  Pending Prescriptions Disp Refills   tiZANidine (ZANAFLEX) 4 MG tablet [Pharmacy Med Name: TIZANIDINE 4MG  TABLETS] 120 tablet 1    Sig: TAKE 1 TABLET BY MOUTH EVERY 4 TO 6 HOURS AS NEEDED      Not Delegated - Cardiovascular:  Alpha-2 Agonists - tizanidine Failed - 11/01/2020 10:36 AM      Failed - This refill cannot be delegated      Passed - Valid encounter within last 6 months    Recent Outpatient Visits           Yesterday Type 2 diabetes mellitus with hyperlipidemia [facility])   [facility], MD   2 months ago Pneumonia due to COVID-19 virus   [facility], MD   2 months ago Pneumonia due to COVID-19 virus   [facility], MD   3 months ago Pneumonia due to COVID-19 virus   [facility], MD   9 months ago Acute cystitis without hematuria   [facility] Chrism[place], [facility], Jodell Cipro

## 2020-11-02 LAB — LIPID PANEL
Chol/HDL Ratio: 6.6 ratio — ABNORMAL HIGH (ref 0.0–4.4)
Cholesterol, Total: 219 mg/dL — ABNORMAL HIGH (ref 100–199)
HDL: 33 mg/dL — ABNORMAL LOW (ref >39)
LDL Chol Calc (NIH): 126 mg/dL — ABNORMAL HIGH (ref 0–99)
Triglycerides: 339 mg/dL — ABNORMAL HIGH (ref 0–149)
VLDL Cholesterol Cal: 60 mg/dL — ABNORMAL HIGH (ref 5–40)

## 2020-11-02 LAB — COMPREHENSIVE METABOLIC PANEL
ALT: 26 IU/L (ref 0–32)
AST: 18 IU/L (ref 0–40)
Albumin/Globulin Ratio: 1.9 (ref 1.2–2.2)
Albumin: 4.5 g/dL (ref 3.8–4.8)
Alkaline Phosphatase: 104 IU/L (ref 44–121)
BUN/Creatinine Ratio: 15 (ref 9–23)
BUN: 10 mg/dL (ref 6–24)
Bilirubin Total: 0.2 mg/dL (ref 0.0–1.2)
CO2: 22 mmol/L (ref 20–29)
Calcium: 9.4 mg/dL (ref 8.7–10.2)
Chloride: 97 mmol/L (ref 96–106)
Creatinine, Ser: 0.65 mg/dL (ref 0.57–1.00)
GFR calc Af Amer: 122 mL/min/{1.73_m2} (ref >59)
GFR calc non Af Amer: 106 mL/min/{1.73_m2} (ref >59)
Globulin, Total: 2.4 g/dL (ref 1.5–4.5)
Glucose: 198 mg/dL — ABNORMAL HIGH (ref 65–99)
Potassium: 4.2 mmol/L (ref 3.5–5.2)
Sodium: 136 mmol/L (ref 134–144)
Total Protein: 6.9 g/dL (ref 6.0–8.5)

## 2020-11-02 LAB — LIPASE: Lipase: 145 U/L — ABNORMAL HIGH (ref 14–72)

## 2020-11-02 LAB — HEMOGLOBIN A1C
Est. average glucose Bld gHb Est-mCnc: 160 mg/dL
Hgb A1c MFr Bld: 7.2 % — ABNORMAL HIGH (ref 4.8–5.6)

## 2020-11-02 LAB — TSH: TSH: 1.62 u[IU]/mL (ref 0.450–4.500)

## 2020-11-04 ENCOUNTER — Other Ambulatory Visit: Payer: Self-pay | Admitting: Family Medicine

## 2020-11-04 NOTE — Telephone Encounter (Signed)
Requested medication (s) are due for refill today: yes  Requested medication (s) are on the active medication list: yes  Last refill:  07/31/20  Future visit scheduled: no  Notes to clinic:  med not delegated to NT to RF   Requested Prescriptions  Pending Prescriptions Disp Refills   ondansetron (ZOFRAN-ODT) 4 MG disintegrating tablet [Pharmacy Med Name: ONDANSETRON ODT 4MG  TABLETS] 20 tablet 0    Sig: DISSOLVE 1 TABLET(4 MG) ON THE TONGUE EVERY 8 HOURS AS NEEDED FOR NAUSEA OR VOMITING      Not Delegated - Gastroenterology: Antiemetics Failed - 11/04/2020 10:55 AM      Failed - This refill cannot be delegated      Passed - Valid encounter within last 6 months    Recent Outpatient Visits           4 days ago Type 2 diabetes mellitus with hyperlipidemia [facility])   [facility], MD   2 months ago Pneumonia due to COVID-19 virus   [facility], MD   2 months ago Pneumonia due to COVID-19 virus   [facility], MD   3 months ago Pneumonia due to COVID-19 virus   [facility], MD   9 months ago Acute cystitis without hematuria   [facility] [place], [facility], Jodell Cipro

## 2020-11-14 ENCOUNTER — Other Ambulatory Visit: Payer: Self-pay | Admitting: Family Medicine

## 2020-11-14 DIAGNOSIS — R059 Cough, unspecified: Secondary | ICD-10-CM

## 2020-11-14 DIAGNOSIS — E785 Hyperlipidemia, unspecified: Secondary | ICD-10-CM

## 2020-11-14 DIAGNOSIS — G8921 Chronic pain due to trauma: Secondary | ICD-10-CM

## 2020-11-14 MED ORDER — GLIPIZIDE 10 MG PO TABS: 10.0000 mg | ORAL_TABLET | Freq: Every day | ORAL | 3 refills | Status: DC

## 2020-11-14 NOTE — Telephone Encounter (Signed)
Requested medication (s) are due for refill today:  yes  Requested medication (s) are on the active medication list: yes  Last refill:  10/16/2020  Future visit scheduled: No  Notes to clinic:  this refill cannot be delegated   Requested Prescriptions  Pending Prescriptions Disp Refills   oxyCODONE (OXY IR/ROXICODONE) 5 MG immediate release tablet 135 tablet 0    Sig: Take 1.5 tablets (7.5 mg total) by mouth 3 (three) times daily.      Not Delegated - Analgesics:  Opioid Agonists Failed - 11/14/2020  1:40 PM      Failed - This refill cannot be delegated      Failed - Urine Drug Screen completed in last 360 days      Passed - Valid encounter within last 6 months    Recent Outpatient Visits           2 weeks ago Type 2 diabetes mellitus with hyperlipidemia [facility])   [facility] Malva Limes, MD   2 months ago Pneumonia due to COVID-19 virus   [facility] Malva Limes, MD   3 months ago Pneumonia due to COVID-19 virus   [facility] Malva Limes, MD   3 months ago Pneumonia due to COVID-19 virus   [facility] Malva Limes, MD   9 months ago Acute cystitis without hematuria   [facility] Chrismon, Jodell Cipro, PA-C                 Signed Prescriptions Disp Refills   glipiZIDE (GLUCOTROL) 10 MG tablet 30 tablet 3    Sig: Take 1 tablet (10 mg total) by mouth daily before breakfast.      Endocrinology:  Diabetes - Sulfonylureas Passed - 11/14/2020  1:40 PM      Passed - HBA1C is between 0 and 7.9 and within 180 days    Hemoglobin A1C  Date Value Ref Range Status  06/17/2014 5.5  Final   Hgb A1c MFr Bld  Date Value Ref Range Status  11/01/2020 7.2 (H) 4.8 - 5.6 % Final    Comment:             Prediabetes: 5.7 - 6.4          Diabetes: >6.4          Glycemic control for adults with diabetes: <7.0           Passed - Valid encounter within last 6 months    Recent Outpatient  Visits           2 weeks ago Type 2 diabetes mellitus with hyperlipidemia [facility])   [facility] Malva Limes, MD   2 months ago Pneumonia due to COVID-19 virus   [facility] Malva Limes, MD   3 months ago Pneumonia due to COVID-19 virus   [facility] Malva Limes, MD   3 months ago Pneumonia due to COVID-19 virus   [facility] Malva Limes, MD   9 months ago Acute cystitis without hematuria   [facility] Chrismon, Jodell Cipro, [place]

## 2020-11-14 NOTE — Telephone Encounter (Signed)
Pt has contacted her pharm and was told to contact her md. Pt needs a refill on oxycodone and glipizide . [facility] in [place] on [address]

## 2020-11-15 MED ORDER — OXYCODONE HCL 5 MG PO TABS: 7.5000 mg | ORAL_TABLET | Freq: Three times a day (TID) | ORAL | 0 refills | Status: DC

## 2020-12-15 ENCOUNTER — Other Ambulatory Visit: Payer: Self-pay | Admitting: Family Medicine

## 2020-12-15 DIAGNOSIS — G8921 Chronic pain due to trauma: Secondary | ICD-10-CM

## 2020-12-15 MED ORDER — OXYCODONE HCL 5 MG PO TABS: 7.5000 mg | ORAL_TABLET | Freq: Three times a day (TID) | ORAL | 0 refills | Status: DC

## 2020-12-15 NOTE — Telephone Encounter (Signed)
Medication Refill - Medication: oxyCODONE (OXY IR/ROXICODONE) 5 MG immediate release tablet     Preferred Pharmacy (with phone number or street name):  [id number] [place] Rough, [place] - [address] AT [facility] [address] Phone:  [phone]  Fax:  [phone]       Agent: Please be advised that RX refills may take up to 3 business days. We ask that you follow-up with your pharmacy.

## 2020-12-15 NOTE — Telephone Encounter (Signed)
Requested medication (s) are due for refill today: yes  Requested medication (s) are on the active medication list: yes   Last refill:  11/15/2020  Future visit scheduled: no  This refill cannot be delegated    Requested Prescriptions  Pending Prescriptions Disp Refills   oxyCODONE (OXY IR/ROXICODONE) 5 MG immediate release tablet 135 tablet 0    Sig: Take 1.5 tablets (7.5 mg total) by mouth 3 (three) times daily.      Not Delegated - Analgesics:  Opioid Agonists Failed - 12/15/2020  1:18 PM      Failed - This refill cannot be delegated      Failed - Urine Drug Screen completed in last 360 days      Passed - Valid encounter within last 6 months    Recent Outpatient Visits           1 month ago Type 2 diabetes mellitus with hyperlipidemia [facility])   [facility] Malva Limes, MD   3 months ago Pneumonia due to COVID-19 virus   [facility] Malva Limes, MD   4 months ago Pneumonia due to COVID-19 virus   [facility] Malva Limes, MD   4 months ago Pneumonia due to COVID-19 virus   [facility] Malva Limes, MD   10 months ago Acute cystitis without hematuria   [facility] Chrismon, Jodell Cipro, [place]

## 2021-01-10 ENCOUNTER — Other Ambulatory Visit: Payer: Self-pay | Admitting: Family Medicine

## 2021-01-10 NOTE — Telephone Encounter (Signed)
Requested medication (s) are due for refill today: yes  Requested medication (s) are on the active medication list: yes  Last refill:  12/13/2020  Future visit scheduled:no  Notes to clinic:  this refill cannot be delegated   Requested Prescriptions  Pending Prescriptions Disp Refills   ondansetron (ZOFRAN-ODT) 4 MG disintegrating tablet [Pharmacy Med Name: ONDANSETRON ODT 4MG  TABLETS] 20 tablet 2    Sig: DISSOLVE 1 TABLET ON THE TONGUE EVERY 8 HOURS AS NEEDED FOR NAUSEA OR VOMITING      Not Delegated - Gastroenterology: Antiemetics Failed - 01/10/2021 10:35 AM      Failed - This refill cannot be delegated      Passed - Valid encounter within last 6 months    Recent Outpatient Visits           2 months ago Type 2 diabetes mellitus with hyperlipidemia [facility])   [facility], MD   4 months ago Pneumonia due to COVID-19 virus   [facility], MD   4 months ago Pneumonia due to COVID-19 virus   [facility], MD   5 months ago Pneumonia due to COVID-19 virus   [facility], MD   11 months ago Acute cystitis without hematuria   [facility] Chrism[place], [facility], Jodell Cipro

## 2021-01-22 ENCOUNTER — Other Ambulatory Visit: Payer: Self-pay | Admitting: Family Medicine

## 2021-01-22 DIAGNOSIS — G8921 Chronic pain due to trauma: Secondary | ICD-10-CM

## 2021-01-22 MED ORDER — OXYCODONE HCL 5 MG PO TABS: 7.5000 mg | ORAL_TABLET | Freq: Three times a day (TID) | ORAL | 0 refills | Status: DC

## 2021-01-22 NOTE — Telephone Encounter (Signed)
Copied from CRM [id number]. Topic: Quick Communication - Rx Refill/Question >> Jan 22, 2021 11:04 AM Marylen Ponto wrote: Medication: oxyCODONE (OXY IR/ROXICODONE) 5 MG immediate release tablet  Has the patient contacted their pharmacy? yes   Preferred Pharmacy (with phone number or street name): [facility] #[id number] Nicholes Rough, [place] - [address] AT [facility] [address] Phone: [phone]   Fax: [phone]  Agent: Please be advised that RX refills may take up to 3 business days. We ask that you follow-up with your pharmacy.

## 2021-01-22 NOTE — Telephone Encounter (Signed)
Refill request for Oxycodone last refilled 12/22/20; pharmacy note reads pt needs appt for refills; no upcoming visits noted; pt notified; the pt says she will be out of town 01/23/21 - 01/29/21; decision tree completed; pt offered and accepted appt with  Dr  Mila Merry, [facility], 02/06/21 at 1320;she verbalized understanding; will route to office for approval.  Requested medication (s) are due for refill today: yes  Requested medication (s) are on the active medication list: yes  Last refill:  12/22/20  Future visit scheduled: yes  Notes to clinic: not delegard

## 2021-01-25 ENCOUNTER — Other Ambulatory Visit: Payer: Self-pay | Admitting: Family Medicine

## 2021-01-25 NOTE — Telephone Encounter (Signed)
Requested medication (s) are due for refill today: no  Requested medication (s) are on the active medication list:  yes  Last refill:  01/22/2021  Future visit scheduled:  yes   Notes to clinic:  this refill cannot be delegated    Requested Prescriptions  Pending Prescriptions Disp Refills   ondansetron (ZOFRAN-ODT) 4 MG disintegrating tablet [Pharmacy Med Name: ONDANSETRON ODT 4MG  TABLETS] 20 tablet 0    Sig: DISSOLVE 1 TABLET BY MOUTH EVERY 8 HOURS AS NEEDED FOR NAUSEA      Not Delegated - Gastroenterology: Antiemetics Failed - 01/25/2021  2:45 PM      Failed - This refill cannot be delegated      Passed - Valid encounter within last 6 months    Recent Outpatient Visits           2 months ago Type 2 diabetes mellitus with hyperlipidemia [facility])   [facility], MD   4 months ago Pneumonia due to COVID-19 virus   [facility], MD   5 months ago Pneumonia due to COVID-19 virus   [facility], MD   5 months ago Pneumonia due to COVID-19 virus   [facility], MD   1 year ago Acute cystitis without hematuria   [facility] Chrismon, [facility], PA-C       Future Appointments             In 1 week Fisher, Jodell Cipro, MD [facility], [facility]

## 2021-01-27 ENCOUNTER — Other Ambulatory Visit: Payer: Self-pay | Admitting: Family Medicine

## 2021-01-27 NOTE — Telephone Encounter (Signed)
Requested Prescriptions  Pending Prescriptions Disp Refills  . glucose blood (ONETOUCH ULTRA) test strip [Pharmacy Med Name: ONE TOUCH ULTRA BLUE TESTST(NEW)100] 100 strip 4    Sig: TEST BLOOD SUGAR THREE TIMES DAILY FOR INSULIN DEPENDENT DIABETES     Endocrinology: Diabetes - Testing Supplies Passed - 01/27/2021 10:33 AM      Passed - Valid encounter within last 12 months    Recent Outpatient Visits          2 months ago Type 2 diabetes mellitus with hyperlipidemia [facility])   [facility] Malva Limes, MD   5 months ago Pneumonia due to COVID-19 virus   [facility] Malva Limes, MD   5 months ago Pneumonia due to COVID-19 virus   [facility] Malva Limes, MD   6 months ago Pneumonia due to COVID-19 virus   [facility] Malva Limes, MD   1 year ago Acute cystitis without hematuria   [facility] Chrismon, Jodell Cipro, PA-C      Future Appointments            In 1 week Fisher, Demetrios Isaacs, MD [facility]

## 2021-02-06 ENCOUNTER — Encounter: Payer: Self-pay | Admitting: Family Medicine

## 2021-02-06 ENCOUNTER — Ambulatory Visit (INDEPENDENT_AMBULATORY_CARE_PROVIDER_SITE_OTHER): Payer: Medicare Other | Admitting: Family Medicine

## 2021-02-06 ENCOUNTER — Other Ambulatory Visit: Payer: Self-pay

## 2021-02-06 VITALS — BP 134/85 | HR 111 | Temp 98.2°F | Resp 18 | Wt 205.6 lb

## 2021-02-06 DIAGNOSIS — N319 Neuromuscular dysfunction of bladder, unspecified: Secondary | ICD-10-CM

## 2021-02-06 DIAGNOSIS — R748 Abnormal levels of other serum enzymes: Secondary | ICD-10-CM

## 2021-02-06 DIAGNOSIS — E781 Pure hyperglyceridemia: Secondary | ICD-10-CM

## 2021-02-06 DIAGNOSIS — E1169 Type 2 diabetes mellitus with other specified complication: Secondary | ICD-10-CM

## 2021-02-06 DIAGNOSIS — Z6835 Body mass index (BMI) 35.0-35.9, adult: Secondary | ICD-10-CM

## 2021-02-06 DIAGNOSIS — G8929 Other chronic pain: Secondary | ICD-10-CM

## 2021-02-06 DIAGNOSIS — G909 Disorder of the autonomic nervous system, unspecified: Secondary | ICD-10-CM | POA: Diagnosis not present

## 2021-02-06 DIAGNOSIS — Z87828 Personal history of other (healed) physical injury and trauma: Secondary | ICD-10-CM | POA: Diagnosis not present

## 2021-02-06 DIAGNOSIS — E785 Hyperlipidemia, unspecified: Secondary | ICD-10-CM | POA: Diagnosis not present

## 2021-02-06 DIAGNOSIS — F1190 Opioid use, unspecified, uncomplicated: Secondary | ICD-10-CM | POA: Diagnosis not present

## 2021-02-06 DIAGNOSIS — E119 Type 2 diabetes mellitus without complications: Secondary | ICD-10-CM

## 2021-02-06 LAB — POCT GLYCOSYLATED HEMOGLOBIN (HGB A1C)
Est. average glucose Bld gHb Est-mCnc: 197
Hemoglobin A1C: 8.5 % — AB (ref 4.0–5.6)

## 2021-02-06 LAB — POCT UA - MICROALBUMIN: Microalbumin Ur, POC: NEGATIVE mg/L

## 2021-02-06 MED ORDER — ATORVASTATIN CALCIUM 10 MG PO TABS: 10.0000 mg | ORAL_TABLET | Freq: Every day | ORAL | 3 refills | Status: DC

## 2021-02-06 MED ORDER — DEXCOM G6 SENSOR MISC: 1.0000 [IU] | Freq: Four times a day (QID) | 4 refills | Status: DC

## 2021-02-06 MED ORDER — PREGABALIN 50 MG PO CAPS: 50.0000 mg | ORAL_CAPSULE | Freq: Two times a day (BID) | ORAL | 5 refills | Status: DC

## 2021-02-06 MED ORDER — DEXCOM G6 TRANSMITTER MISC: 1.0000 [IU] | Freq: Four times a day (QID) | 4 refills | Status: DC
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
## 2021-02-06 NOTE — Patient Instructions (Addendum)
.   Please review the attached list of medications and notify my office if there are any errors.   . Please bring all of your medications to every appointment so we can make sure that our medication list is the same as yours.   

## 2021-02-07 ENCOUNTER — Encounter: Payer: Self-pay | Admitting: Family Medicine

## 2021-02-07 DIAGNOSIS — E785 Hyperlipidemia, unspecified: Secondary | ICD-10-CM

## 2021-02-07 DIAGNOSIS — E1169 Type 2 diabetes mellitus with other specified complication: Secondary | ICD-10-CM

## 2021-02-07 LAB — COMPREHENSIVE METABOLIC PANEL
ALT: 32 IU/L (ref 0–32)
AST: 23 IU/L (ref 0–40)
Albumin/Globulin Ratio: 1.7 (ref 1.2–2.2)
Albumin: 4.8 g/dL (ref 3.8–4.8)
Alkaline Phosphatase: 132 IU/L — ABNORMAL HIGH (ref 44–121)
BUN/Creatinine Ratio: 16 (ref 9–23)
BUN: 10 mg/dL (ref 6–24)
Bilirubin Total: 0.2 mg/dL (ref 0.0–1.2)
CO2: 16 mmol/L — ABNORMAL LOW (ref 20–29)
Calcium: 10.2 mg/dL (ref 8.7–10.2)
Chloride: 98 mmol/L (ref 96–106)
Creatinine, Ser: 0.61 mg/dL (ref 0.57–1.00)
Globulin, Total: 2.9 g/dL (ref 1.5–4.5)
Glucose: 309 mg/dL — ABNORMAL HIGH (ref 65–99)
Potassium: 4 mmol/L (ref 3.5–5.2)
Sodium: 136 mmol/L (ref 134–144)
Total Protein: 7.7 g/dL (ref 6.0–8.5)
eGFR: 111 mL/min/{1.73_m2} (ref >59)

## 2021-02-07 LAB — PAIN MGT SCRN (14 DRUGS), UR
Amphetamine Scrn, Ur: NEGATIVE ng/mL
BARBITURATE SCREEN URINE: NEGATIVE ng/mL
BENZODIAZEPINE SCREEN, URINE: NEGATIVE ng/mL
Buprenorphine, Urine: NEGATIVE ng/mL
CANNABINOIDS UR QL SCN: NEGATIVE ng/mL
Cocaine (Metab) Scrn, Ur: NEGATIVE ng/mL
Creatinine(Crt), U: 67.5 mg/dL (ref 20.0–300.0)
Fentanyl, Urine: NEGATIVE pg/mL
Meperidine Screen, Urine: NEGATIVE ng/mL
Methadone Screen, Urine: NEGATIVE ng/mL
OXYCODONE+OXYMORPHONE UR QL SCN: POSITIVE ng/mL — AB
Opiate Scrn, Ur: NEGATIVE ng/mL
Ph of Urine: 5.3 (ref 4.5–8.9)
Phencyclidine Qn, Ur: NEGATIVE ng/mL
Propoxyphene Scrn, Ur: NEGATIVE ng/mL
Tramadol Screen, Urine: NEGATIVE ng/mL

## 2021-02-07 LAB — LIPASE: Lipase: 31 U/L (ref 14–72)

## 2021-02-07 MED ORDER — OZEMPIC (0.25 OR 0.5 MG/DOSE) 2 MG/1.5ML ~~LOC~~ SOPN: 0.2500 mg | PEN_INJECTOR | SUBCUTANEOUS | 0 refills | Status: DC

## 2021-02-08 ENCOUNTER — Other Ambulatory Visit: Payer: Self-pay | Admitting: Family Medicine

## 2021-02-09 ENCOUNTER — Other Ambulatory Visit: Payer: Self-pay | Admitting: Family Medicine

## 2021-02-09 ENCOUNTER — Encounter: Payer: Self-pay | Admitting: Family Medicine

## 2021-02-09 NOTE — Telephone Encounter (Signed)
Requested medications are due for refill today YES  Requested medications are on the active medication list YES  Last refill 2/15  Last visit 3/22  Future visit scheduled 6/22  Notes to clinic Not Delegated.

## 2021-02-11 ENCOUNTER — Other Ambulatory Visit: Payer: Self-pay | Admitting: Family Medicine

## 2021-02-12 MED ORDER — BD PEN NEEDLE NANO 2ND GEN 32G X 4 MM MISC: 1.0000 | Freq: Three times a day (TID) | 3 refills | Status: DC

## 2021-02-16 ENCOUNTER — Other Ambulatory Visit: Payer: Self-pay | Admitting: Family Medicine

## 2021-02-16 DIAGNOSIS — G8921 Chronic pain due to trauma: Secondary | ICD-10-CM

## 2021-02-16 MED ORDER — OXYCODONE HCL 5 MG PO TABS: 7.5000 mg | ORAL_TABLET | Freq: Three times a day (TID) | ORAL | 0 refills | Status: DC

## 2021-02-16 NOTE — Telephone Encounter (Signed)
Medication: oxyCODONE (OXY IR/ROXICODONE) 5 MG immediate release tablet  Has the pt contacted their pharmacy? No  Preferred pharmacy: [facility] #[id number] [place] Rough, [place] - [address]  Please be advised refills may take up to 3 business days.  We ask that you follow up with your pharmacy.

## 2021-02-16 NOTE — Telephone Encounter (Signed)
Requested medication (s) are due for refill today: Yes  Requested medication (s) are on the active medication list: Yes  Last refill:  01/22/21  Future visit scheduled: Yes  Notes to clinic:  See request.    Requested Prescriptions  Pending Prescriptions Disp Refills   oxyCODONE (OXY IR/ROXICODONE) 5 MG immediate release tablet 135 tablet 0    Sig: Take 1.5 tablets (7.5 mg total) by mouth 3 (three) times daily.      Not Delegated - Analgesics:  Opioid Agonists Failed - 02/16/2021 11:06 AM      Failed - This refill cannot be delegated      Failed - Urine Drug Screen completed in last 360 days      Passed - Valid encounter within last 6 months    Recent Outpatient Visits           1 week ago Type 2 diabetes mellitus with hyperlipidemia [facility])   [facility] Malva Limes, MD   3 months ago Type 2 diabetes mellitus with hyperlipidemia [facility])   [facility] Malva Limes, MD   5 months ago Pneumonia due to COVID-19 virus   [facility] Malva Limes, MD   6 months ago Pneumonia due to COVID-19 virus   [facility] Malva Limes, MD   6 months ago Pneumonia due to COVID-19 virus   [facility] Fisher, Demetrios Isaacs, MD       Future Appointments             In 3 months Fisher, Demetrios Isaacs, MD [facility], [facility]

## 2021-02-26 ENCOUNTER — Other Ambulatory Visit: Payer: Self-pay | Admitting: Family Medicine

## 2021-02-26 DIAGNOSIS — E785 Hyperlipidemia, unspecified: Secondary | ICD-10-CM

## 2021-02-26 DIAGNOSIS — E1169 Type 2 diabetes mellitus with other specified complication: Secondary | ICD-10-CM

## 2021-02-26 MED ORDER — ONETOUCH ULTRA 2 W/DEVICE KIT: PACK | 0 refills | Status: DC

## 2021-03-12 ENCOUNTER — Telehealth: Payer: Self-pay | Admitting: Family Medicine

## 2021-03-12 DIAGNOSIS — M79673 Pain in unspecified foot: Secondary | ICD-10-CM

## 2021-03-12 DIAGNOSIS — E1169 Type 2 diabetes mellitus with other specified complication: Secondary | ICD-10-CM

## 2021-03-12 NOTE — Telephone Encounter (Signed)
Patient called to request a referral to a podiatrist, Dr. Romualdo Bolk, from [facility].  She stated she has a sore on her foot and she is diabetic and would like a specialist to look at it.  Please advise and call patient to confirm at [phone]

## 2021-03-17 DIAGNOSIS — E119 Type 2 diabetes mellitus without complications: Secondary | ICD-10-CM | POA: Diagnosis not present

## 2021-03-17 DIAGNOSIS — Z0100 Encounter for examination of eyes and vision without abnormal findings: Secondary | ICD-10-CM | POA: Diagnosis not present

## 2021-03-17 LAB — HM DIABETES EYE EXAM

## 2021-03-19 ENCOUNTER — Other Ambulatory Visit: Payer: Self-pay | Admitting: Family Medicine

## 2021-03-19 DIAGNOSIS — G8921 Chronic pain due to trauma: Secondary | ICD-10-CM

## 2021-03-19 MED ORDER — INSULIN LISPRO (1 UNIT DIAL) 100 UNIT/ML (KWIKPEN): PEN_INJECTOR | SUBCUTANEOUS | 4 refills | Status: DC

## 2021-03-19 MED ORDER — ONDANSETRON 4 MG PO TBDP: 4.0000 mg | ORAL_TABLET | Freq: Three times a day (TID) | ORAL | 2 refills | Status: DC | PRN

## 2021-03-19 MED ORDER — OXYCODONE HCL 5 MG PO TABS: 7.5000 mg | ORAL_TABLET | Freq: Three times a day (TID) | ORAL | 0 refills | Status: DC

## 2021-03-22 ENCOUNTER — Other Ambulatory Visit: Payer: Self-pay | Admitting: Family Medicine

## 2021-03-22 DIAGNOSIS — G8921 Chronic pain due to trauma: Secondary | ICD-10-CM

## 2021-03-22 MED ORDER — OXYCODONE HCL 5 MG PO TABS: 7.5000 mg | ORAL_TABLET | Freq: Three times a day (TID) | ORAL | 0 refills | Status: DC

## 2021-03-22 NOTE — Telephone Encounter (Signed)
Requested medication (s) are due for refill today:   Yes  Requested medication (s) are on the active medication list:   Yes  Future visit scheduled:   Yes   Last ordered: 03/19/2021 #135, 0 refills was sent to the mail order pharmacy but pt wants it sent to [facility] [phone]    Requested Prescriptions  Pending Prescriptions Disp Refills   oxyCODONE (OXY IR/ROXICODONE) 5 MG immediate release tablet 135 tablet 0    Sig: Take 1.5 tablets (7.5 mg total) by mouth 3 (three) times daily.      Not Delegated - Analgesics:  Opioid Agonists Failed - 03/22/2021  3:10 PM      Failed - This refill cannot be delegated      Failed - Urine Drug Screen completed in last 360 days      Passed - Valid encounter within last 6 months    Recent Outpatient Visits           1 month ago Type 2 diabetes mellitus with hyperlipidemia [facility])   [facility] Malva Limes, MD   4 months ago Type 2 diabetes mellitus with hyperlipidemia [facility])   [facility] Malva Limes, MD   6 months ago Pneumonia due to COVID-19 virus   [facility] Malva Limes, MD   7 months ago Pneumonia due to COVID-19 virus   [facility] Malva Limes, MD   7 months ago Pneumonia due to COVID-19 virus   [facility] Fisher, Demetrios Isaacs, MD       Future Appointments             In 2 months Fisher, Demetrios Isaacs, MD [facility], [facility]

## 2021-03-22 NOTE — Telephone Encounter (Signed)
Copied from CRM [id number]. Topic: Quick Communication - Rx Refill/Question >> Mar 22, 2021  3:04 PM Gaetana Michaelis A wrote: Medication: oxyCODONE (OXY IR/ROXICODONE) 5 MG immediate release tablet   Has the patient contacted their pharmacy? Yes. Their prescription was initially sent to the [facility] but it was supposed to go to the pharmacy located on [address]. Due to the classification of the medication, the patient was directed to contact their PCP  Preferred Pharmacy (with phone number or street name): [facility] #[id number] Nicholes Rough, [place] - [address] AT [facility] [address]  Phone:  [phone] Fax:  [phone]  Agent: Please be advised that RX refills may take up to 3 business days. We ask that you follow-up with your pharmacy.

## 2021-04-04 DIAGNOSIS — R200 Anesthesia of skin: Secondary | ICD-10-CM | POA: Insufficient documentation

## 2021-04-04 DIAGNOSIS — M79671 Pain in right foot: Secondary | ICD-10-CM | POA: Insufficient documentation

## 2021-04-04 DIAGNOSIS — M2042 Other hammer toe(s) (acquired), left foot: Secondary | ICD-10-CM | POA: Diagnosis not present

## 2021-04-04 DIAGNOSIS — M2041 Other hammer toe(s) (acquired), right foot: Secondary | ICD-10-CM | POA: Diagnosis not present

## 2021-04-04 DIAGNOSIS — M79672 Pain in left foot: Secondary | ICD-10-CM | POA: Diagnosis not present

## 2021-04-04 DIAGNOSIS — R202 Paresthesia of skin: Secondary | ICD-10-CM | POA: Diagnosis not present

## 2021-04-06 ENCOUNTER — Encounter: Payer: Self-pay | Admitting: Family Medicine

## 2021-04-06 DIAGNOSIS — Z87828 Personal history of other (healed) physical injury and trauma: Secondary | ICD-10-CM | POA: Insufficient documentation

## 2021-04-06 DIAGNOSIS — F1190 Opioid use, unspecified, uncomplicated: Secondary | ICD-10-CM | POA: Insufficient documentation

## 2021-04-10 ENCOUNTER — Other Ambulatory Visit: Payer: Self-pay | Admitting: Family Medicine

## 2021-04-10 DIAGNOSIS — E785 Hyperlipidemia, unspecified: Secondary | ICD-10-CM

## 2021-04-10 DIAGNOSIS — E1169 Type 2 diabetes mellitus with other specified complication: Secondary | ICD-10-CM

## 2021-04-23 ENCOUNTER — Other Ambulatory Visit: Payer: Self-pay | Admitting: Family Medicine

## 2021-04-23 DIAGNOSIS — G8921 Chronic pain due to trauma: Secondary | ICD-10-CM

## 2021-04-23 MED ORDER — OXYCODONE HCL 5 MG PO TABS: 7.5000 mg | ORAL_TABLET | Freq: Three times a day (TID) | ORAL | 0 refills | Status: DC

## 2021-04-23 NOTE — Telephone Encounter (Signed)
Requested medication (s) are due for refill today:yes  Requested medication (s) are on the active medication list: yes  Last refill: 03/22/21  #135  0 refills  Future visit scheduled yes  05/21/21  Notes to clinic: Not delegated  Requested Prescriptions  Pending Prescriptions Disp Refills   oxyCODONE (OXY IR/ROXICODONE) 5 MG immediate release tablet 135 tablet 0    Sig: Take 1.5 tablets (7.5 mg total) by mouth 3 (three) times daily.      Not Delegated - Analgesics:  Opioid Agonists Failed - 04/23/2021 11:40 AM      Failed - This refill cannot be delegated      Failed - Urine Drug Screen completed in last 360 days      Passed - Valid encounter within last 6 months    Recent Outpatient Visits           2 months ago Type 2 diabetes mellitus with hyperlipidemia [facility])   [facility] Malva Limes, MD   5 months ago Type 2 diabetes mellitus with hyperlipidemia [facility])   [facility] Malva Limes, MD   7 months ago Pneumonia due to COVID-19 virus   [facility] Malva Limes, MD   8 months ago Pneumonia due to COVID-19 virus   [facility] Malva Limes, MD   8 months ago Pneumonia due to COVID-19 virus   [facility] Fisher, Demetrios Isaacs, MD       Future Appointments             In 4 weeks Fisher, Demetrios Isaacs, MD [facility], [facility]

## 2021-04-23 NOTE — Telephone Encounter (Signed)
Medication: oxyCODONE (OXY IR/ROXICODONE) 5 MG immediate release tablet [[id number]]   Has the patient contacted their pharmacy? YES  (Agent: If no, request that the patient contact the pharmacy for the refill.) (Agent: If yes, when and what did the pharmacy advise?)  Preferred Pharmacy (with phone number or street name): [facility] #[id number] Nicholes Rough, [place] - [address] AT [facility] [address] [place] [address] Phone: [phone] Fax: [phone] Hours: Not open 24 hours    Agent: Please be advised that RX refills may take up to 3 business days. We ask that you follow-up with your pharmacy.

## 2021-05-10 ENCOUNTER — Other Ambulatory Visit: Payer: Self-pay | Admitting: Family Medicine

## 2021-05-10 DIAGNOSIS — E785 Hyperlipidemia, unspecified: Secondary | ICD-10-CM

## 2021-05-10 DIAGNOSIS — R059 Cough, unspecified: Secondary | ICD-10-CM

## 2021-05-21 ENCOUNTER — Encounter: Payer: Self-pay | Admitting: Family Medicine

## 2021-05-21 ENCOUNTER — Ambulatory Visit (INDEPENDENT_AMBULATORY_CARE_PROVIDER_SITE_OTHER): Payer: Medicare Other | Admitting: Family Medicine

## 2021-05-21 ENCOUNTER — Other Ambulatory Visit: Payer: Self-pay

## 2021-05-21 VITALS — BP 124/87 | HR 101 | Temp 97.9°F | Resp 18 | Wt 197.2 lb

## 2021-05-21 DIAGNOSIS — E1169 Type 2 diabetes mellitus with other specified complication: Secondary | ICD-10-CM | POA: Diagnosis not present

## 2021-05-21 DIAGNOSIS — G8921 Chronic pain due to trauma: Secondary | ICD-10-CM

## 2021-05-21 DIAGNOSIS — E785 Hyperlipidemia, unspecified: Secondary | ICD-10-CM

## 2021-05-21 DIAGNOSIS — I10 Essential (primary) hypertension: Secondary | ICD-10-CM

## 2021-05-21 DIAGNOSIS — M25551 Pain in right hip: Secondary | ICD-10-CM

## 2021-05-21 LAB — POCT GLYCOSYLATED HEMOGLOBIN (HGB A1C)
Est. average glucose Bld gHb Est-mCnc: 143
Hemoglobin A1C: 6.6 % — AB (ref 4.0–5.6)

## 2021-05-21 MED ORDER — OXYCODONE HCL 5 MG PO TABS: 7.5000 mg | ORAL_TABLET | Freq: Three times a day (TID) | ORAL | 0 refills | Status: DC
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
## 2021-05-22 MED ORDER — AMLODIPINE BESYLATE 10 MG PO TABS: 10.0000 mg | ORAL_TABLET | Freq: Every day | ORAL | 1 refills | Status: DC

## 2021-05-23 ENCOUNTER — Other Ambulatory Visit: Payer: Self-pay | Admitting: Family Medicine

## 2021-05-23 DIAGNOSIS — E119 Type 2 diabetes mellitus without complications: Secondary | ICD-10-CM

## 2021-05-23 NOTE — Telephone Encounter (Signed)
   Notes to clinic: script last filled 12/18/2020 Review for continued use and refill Patient should have been out of medication    Requested Prescriptions  Pending Prescriptions Disp Refills   metFORMIN (GLUCOPHAGE) 500 MG tablet [Pharmacy Med Name: METFORMIN 500MG TABLETS] 360 tablet     Sig: TAKE 2 TABLETS BY [place]      Endocrinology:  Diabetes - Biguanides Passed - 05/23/2021 10:32 AM      Passed - Cr in normal range and within 360 days    Creatinine, Ser  Date Value Ref Range Status  02/06/2021 0.61 0.57 - 1.00 mg/dL Final   Creatinine, POC  Date Value Ref Range Status  11/21/2017 NA mg/dL Final          Passed - HBA1C is between 0 and 7.9 and within 180 days    Hemoglobin A1C  Date Value Ref Range Status  05/21/2021 6.6 (A) 4.0 - 5.6 % Final  06/17/2014 5.5  Final   Hgb A1c MFr Bld  Date Value Ref Range Status  11/01/2020 7.2 (H) 4.8 - 5.6 % Final    Comment:             Prediabetes: 5.7 - 6.4          Diabetes: >6.4          Glycemic control for adults with diabetes: <7.0           Passed - eGFR in normal range and within 360 days    GFR calc Af Amer  Date Value Ref Range Status  11/01/2020 122 >59 mL/min/1.73 Final    Comment:    **In accordance with recommendations from the NKF-ASN Task force,**   [facility] is in the process of updating its eGFR calculation to the   2021 CKD-EPI creatinine equation that estimates kidney function   without a race variable.    GFR calc non Af Amer  Date Value Ref Range Status  11/01/2020 106 >59 mL/min/1.73 Final   eGFR  Date Value Ref Range Status  02/06/2021 111 >59 mL/min/1.73 Final          Passed - Valid encounter within last 6 months    Recent Outpatient Visits           2 days ago Type 2 diabetes mellitus with hyperlipidemia [facility])   [facility] Birdie Sons, MD   3 months ago Type 2 diabetes mellitus with hyperlipidemia [facility])   [facility] Birdie Sons, MD   6 months ago Type 2 diabetes mellitus with hyperlipidemia [facility])   [facility] Birdie Sons, MD   8 months ago Pneumonia due to COVID-19 virus   [facility] Birdie Sons, MD   9 months ago Pneumonia due to COVID-19 virus   Gates, Kirstie Peri, MD       Future Appointments             In 3 months Fisher, Kirstie Peri, MD [facility], [place]

## 2021-06-01 ENCOUNTER — Telehealth: Payer: Self-pay | Admitting: Family Medicine

## 2021-06-01 NOTE — Telephone Encounter (Signed)
We will not prescribe cough medication with codeine without evaluation.  Recommend Delsym and honey.  Studies show that honey outperforms cough medications, including prescriptions, for cough.

## 2021-06-01 NOTE — Telephone Encounter (Signed)
Please review for Dr. Fisher.   Thanks,   -Kendell Sagraves  

## 2021-06-01 NOTE — Telephone Encounter (Signed)
Cough is not productive and requesting a cough syrup medication. PCP has prescribed cough syrup in the past. Patient states OTC medication is not working and this is a seasonal allergy     [facility][id number] Nicholes Rough, [place] - [address] AT [facility] OF [address] Phone:  [phone]  Fax:  [phone]

## 2021-06-03 ENCOUNTER — Other Ambulatory Visit: Payer: Self-pay | Admitting: Family Medicine

## 2021-06-03 DIAGNOSIS — E1169 Type 2 diabetes mellitus with other specified complication: Secondary | ICD-10-CM

## 2021-06-03 MED ORDER — OZEMPIC (0.25 OR 0.5 MG/DOSE) 2 MG/1.5ML ~~LOC~~ SOPN: 0.5000 mg | PEN_INJECTOR | SUBCUTANEOUS | 3 refills | Status: DC

## 2021-06-05 NOTE — Telephone Encounter (Signed)
Left message for patient with appt information. Appt 06/06/21 at 9:20 am at [facility]. [facility] please advise.

## 2021-06-06 ENCOUNTER — Ambulatory Visit: Payer: Medicare Other | Admitting: Family Medicine

## 2021-06-16 ENCOUNTER — Other Ambulatory Visit: Payer: Self-pay | Admitting: Family Medicine

## 2021-06-16 NOTE — Telephone Encounter (Signed)
Requested Prescriptions  Pending Prescriptions Disp Refills  . glucose blood (ONETOUCH ULTRA) test strip [Pharmacy Med Name: ONE TOUCH ULTRA BLUE TESTST(NEW)100] 100 strip 4    Sig: USE TEST STRIPS TO TEST BLOOD SUGAR THREE TIMES DAILY FOR INSULIN DEPENDENT DIABEATES     Endocrinology: Diabetes - Testing Supplies Passed - 06/16/2021 10:34 AM      Passed - Valid encounter within last 12 months    Recent Outpatient Visits          3 weeks ago Type 2 diabetes mellitus with hyperlipidemia [facility])   [facility] Malva Limes, MD   4 months ago Type 2 diabetes mellitus with hyperlipidemia [facility])   [facility] Malva Limes, MD   7 months ago Type 2 diabetes mellitus with hyperlipidemia [facility])   [facility] Malva Limes, MD   9 months ago Pneumonia due to COVID-19 virus   [facility] Malva Limes, MD   10 months ago Pneumonia due to COVID-19 virus   [facility] Fisher, Demetrios Isaacs, MD      Future Appointments            In 3 months Fisher, Demetrios Isaacs, MD [facility], [facility]

## 2021-06-21 ENCOUNTER — Other Ambulatory Visit: Payer: Self-pay | Admitting: Family Medicine

## 2021-06-21 ENCOUNTER — Telehealth: Payer: Self-pay

## 2021-06-21 DIAGNOSIS — G8921 Chronic pain due to trauma: Secondary | ICD-10-CM

## 2021-06-21 MED ORDER — OXYCODONE HCL 5 MG PO TABS: 7.5000 mg | ORAL_TABLET | Freq: Three times a day (TID) | ORAL | 0 refills | Status: DC

## 2021-06-21 MED ORDER — CIPROFLOXACIN HCL 0.3 % OP SOLN: 1.0000 [drp] | Freq: Every day | OPHTHALMIC | 0 refills | Status: AC

## 2021-06-21 NOTE — Telephone Encounter (Signed)
Medication Refill - Medication: Oxycodone 5 mg  Has the patient contacted their pharmacy? No.  Controlled substance (Agent: If no, request that the patient contact the pharmacy for the refill.) (Agent: If yes, when and what did the pharmacy advise?)  Preferred Pharmacy (with phone number or street name): [facility]  [address]  Agent: Please be advised that RX refills may take up to 3 business days. We ask that you follow-up with your pharmacy.

## 2021-06-21 NOTE — Addendum Note (Signed)
Addended by: [place] on: 06/21/2021 03:53 PM   Modules accepted: Orders

## 2021-06-21 NOTE — Telephone Encounter (Signed)
Cipro eye drops as directed.

## 2021-06-21 NOTE — Telephone Encounter (Signed)
Medication sent into the pharmacy. 

## 2021-06-21 NOTE — Telephone Encounter (Signed)
Please advise. Thanks.  

## 2021-06-21 NOTE — Telephone Encounter (Signed)
Copied from CRM [id number]. Topic: General - Inquiry >> Jun 21, 2021  9:09 AM Daphine Deutscher D wrote: Reason for CRM: Pt says she has pink eye that she caught from her dadand would like someone to send eye drops to her pharmacy  [facility]  [address] [facility]  CB#  [phone]

## 2021-06-27 ENCOUNTER — Other Ambulatory Visit: Payer: Self-pay | Admitting: Family Medicine

## 2021-06-27 NOTE — Telephone Encounter (Signed)
Requested medication (s) are due for refill today: Yes  Requested medication (s) are on the active medication list: Yes  Last refill:  04/11/20  Future visit scheduled: Yes  Notes to clinic:  Prescription expired.    Requested Prescriptions  Pending Prescriptions Disp Refills   Lancets (ONETOUCH DELICA PLUS LANCET33G) MISC [Pharmacy Med Name: ONE TOUCH DELICA PLUS 33G LANCETS] 100 each 4    Sig: CHECK FASTING BLOOD SUGAR ONCE DAILY AS DIRECTED      Endocrinology: Diabetes - Testing Supplies Passed - 06/27/2021 10:32 AM      Passed - Valid encounter within last 12 months    Recent Outpatient Visits           1 month ago Type 2 diabetes mellitus with hyperlipidemia [facility])   [facility] Malva Limes, MD   4 months ago Type 2 diabetes mellitus with hyperlipidemia [facility])   [facility] Malva Limes, MD   7 months ago Type 2 diabetes mellitus with hyperlipidemia [facility])   [facility] Malva Limes, MD   10 months ago Pneumonia due to COVID-19 virus   [facility] Malva Limes, MD   10 months ago Pneumonia due to COVID-19 virus   [facility] Fisher, Demetrios Isaacs, MD       Future Appointments             In 2 months Fisher, Demetrios Isaacs, MD [facility], [facility]

## 2021-07-09 ENCOUNTER — Other Ambulatory Visit: Payer: Self-pay | Admitting: Family Medicine

## 2021-07-09 DIAGNOSIS — E781 Pure hyperglyceridemia: Secondary | ICD-10-CM

## 2021-07-23 ENCOUNTER — Other Ambulatory Visit: Payer: Self-pay | Admitting: Family Medicine

## 2021-07-23 DIAGNOSIS — G8921 Chronic pain due to trauma: Secondary | ICD-10-CM

## 2021-07-23 NOTE — Telephone Encounter (Signed)
Requested medication (s) are due for refill today:yes   Requested medication (s) are on the active medication list: yes   Last refill:  06/21/2021  Future visit scheduled:yes   Notes to clinic:  this refill cannot be delegated   Requested Prescriptions  Pending Prescriptions Disp Refills   oxyCODONE (OXY IR/ROXICODONE) 5 MG immediate release tablet 135 tablet 0    Sig: Take 1.5 tablets (7.5 mg total) by mouth 3 (three) times daily.     Not Delegated - Analgesics:  Opioid Agonists Failed - 07/23/2021 11:36 AM      Failed - This refill cannot be delegated      Failed - Urine Drug Screen completed in last 360 days      Passed - Valid encounter within last 6 months    Recent Outpatient Visits           2 months ago Type 2 diabetes mellitus with hyperlipidemia [facility])   [facility] Malva Limes, MD   5 months ago Type 2 diabetes mellitus with hyperlipidemia [facility])   [facility] Malva Limes, MD   8 months ago Type 2 diabetes mellitus with hyperlipidemia [facility])   [facility] Malva Limes, MD   10 months ago Pneumonia due to COVID-19 virus   [facility] Malva Limes, MD   11 months ago Pneumonia due to COVID-19 virus   [facility] Fisher, Demetrios Isaacs, MD       Future Appointments             In 1 month Fisher, Demetrios Isaacs, MD [facility], [facility]

## 2021-07-23 NOTE — Telephone Encounter (Signed)
Medication Refill - Medication: oxyCODONE (OXY IR/ROXICODONE) 5 MG immediate release tablet   Pt is completely out of her current supply.   Has the patient contacted their pharmacy? Yes.   (Agent: If no, request that the patient contact the pharmacy for the refill.) (Agent: If yes, when and what did the pharmacy advise?) [facility] #[id number] Nicholes Rough, [place] - [address] AT [facility] OF [address]  [address] [place] [address]  Phone: [phone] Fax: [phone]   Preferred Pharmacy (with phone number or street name):   Agent: Please be advised that RX refills may take up to 3 business days. We ask that you follow-up with your pharmacy.

## 2021-07-24 MED ORDER — OXYCODONE HCL 5 MG PO TABS: 7.5000 mg | ORAL_TABLET | Freq: Three times a day (TID) | ORAL | 0 refills | Status: DC

## 2021-08-01 ENCOUNTER — Telehealth: Payer: Self-pay

## 2021-08-01 ENCOUNTER — Other Ambulatory Visit: Payer: Self-pay

## 2021-08-01 MED ORDER — NIRMATRELVIR/RITONAVIR (PAXLOVID)TABLET
ORAL_TABLET | ORAL | 0 refills | Status: DC
  Filled 2021-08-01: qty 30, 5d supply, fill #0

## 2021-08-01 NOTE — Telephone Encounter (Signed)
Copied from CRM [id number]. Topic: General - Other >> Aug 01, 2021  2:48 PM Gaetana Michaelis A wrote: Reason for CRM: Patient has tested positive for COVID 19 via an at home test  Patient shares that they're experiencing discomfort related to cough and congestion   The patient would like to be prescribed something to help with discomfort   Please contact further when possible

## 2021-08-01 NOTE — Telephone Encounter (Addendum)
I called patient. She tested positive 4 days ago. Symptoms started 4 days ago. Symptoms include dry cough, fever and congestion. I gave her [facility] pharmacy contact information for COVID screening and possible prescription for an antiviral.

## 2021-08-02 ENCOUNTER — Other Ambulatory Visit: Payer: Self-pay

## 2021-08-02 ENCOUNTER — Telehealth: Payer: Self-pay | Admitting: Pharmacist
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
## 2021-08-03 ENCOUNTER — Ambulatory Visit: Payer: Self-pay | Admitting: *Deleted

## 2021-08-03 NOTE — Telephone Encounter (Signed)
Patient hung up before transfer to NT. Will attempt to reach patient via phone.

## 2021-08-15 ENCOUNTER — Other Ambulatory Visit: Payer: Self-pay | Admitting: Family Medicine

## 2021-08-15 DIAGNOSIS — R059 Cough, unspecified: Secondary | ICD-10-CM

## 2021-08-15 DIAGNOSIS — E1169 Type 2 diabetes mellitus with other specified complication: Secondary | ICD-10-CM

## 2021-08-20 ENCOUNTER — Other Ambulatory Visit: Payer: Self-pay | Admitting: Family Medicine

## 2021-08-20 DIAGNOSIS — G8921 Chronic pain due to trauma: Secondary | ICD-10-CM

## 2021-08-20 NOTE — Telephone Encounter (Signed)
Requested medications are due for refill today.  Seems a bit too soon  Requested medications are on the active medications list.  yes  Last refill. 07/24/2021  Future visit scheduled.   yes  Notes to clinic.  Medication not delegated.

## 2021-08-20 NOTE — Telephone Encounter (Signed)
Requested medications are due for refill today yes  Requested medications are on the active medication list yes  Last refill 08/25/2020  Last visit 05/2021  Future visit scheduled 09/18/21  Notes to clinic Not Delegated.

## 2021-08-20 NOTE — Telephone Encounter (Signed)
Medication Refill - Medication: oxyCODONE (OXY IR/ROXICODONE) 5 MG immediate release tablet [[id number]]   Has the patient contacted their pharmacy?  (Agent: If no, request that the patient contact the pharmacy for the refill.) (Agent: If yes, when and what did the pharmacy advise?)  Preferred Pharmacy (with phone number or street name): [facility] #[id number] Nicholes Rough, [place] - [address] AT [facility] [address]  [address], [place] [address]  Has the patient been seen for an appointment in the last year OR does the patient have an upcoming appointment?   Agent: Please be advised that RX refills may take up to 3 business days. We ask that you follow-up with your pharmacy.

## 2021-08-20 NOTE — Telephone Encounter (Signed)
Requested medications are due for refill today.  yes  Requested medications are on the active medications list.  yes  Last refill. Xanax 02/12/2021, Zofran 03/19/2021  Future visit scheduled.   yes  Notes to clinic.  Medications not delegated.

## 2021-08-21 MED ORDER — OXYCODONE HCL 5 MG PO TABS: 7.5000 mg | ORAL_TABLET | Freq: Three times a day (TID) | ORAL | 0 refills | Status: DC

## 2021-09-12 ENCOUNTER — Other Ambulatory Visit: Payer: Self-pay | Admitting: Family Medicine

## 2021-09-12 NOTE — Telephone Encounter (Signed)
Requested Prescriptions  Pending Prescriptions Disp Refills  . albuterol (VENTOLIN HFA) 108 (90 Base) MCG/ACT inhaler [Pharmacy Med Name: ALBUTEROL HFA INH (200 PUFFS)8.5GM] 25.5 g 0    Sig: INHALE 2 PUFFS INTO THE LUNGS EVERY 6 HOURS AS NEEDED FOR WHEEZING OR SHORTNESS OF BREATH     Pulmonology:  Beta Agonists Failed - 09/12/2021 12:22 PM      Failed - One inhaler should last at least one month. If the patient is requesting refills earlier, contact the patient to check for uncontrolled symptoms.      Passed - Valid encounter within last 12 months    Recent Outpatient Visits          3 months ago Type 2 diabetes mellitus with hyperlipidemia [facility])   [facility] Malva Limes, MD   7 months ago Type 2 diabetes mellitus with hyperlipidemia [facility])   [facility] Malva Limes, MD   10 months ago Type 2 diabetes mellitus with hyperlipidemia [facility])   [facility] Malva Limes, MD   1 year ago Pneumonia due to COVID-19 virus   [facility] Malva Limes, MD   1 year ago Pneumonia due to COVID-19 virus   [facility] Malva Limes, MD      Future Appointments            In 6 days Fisher, Demetrios Isaacs, MD [facility], [facility]

## 2021-09-18 ENCOUNTER — Ambulatory Visit (INDEPENDENT_AMBULATORY_CARE_PROVIDER_SITE_OTHER): Payer: Medicare Other | Admitting: Family Medicine

## 2021-09-18 ENCOUNTER — Encounter: Payer: Self-pay | Admitting: Family Medicine

## 2021-09-18 ENCOUNTER — Other Ambulatory Visit: Payer: Self-pay

## 2021-09-18 VITALS — BP 152/86 | HR 83 | Temp 98.0°F | Ht 64.0 in | Wt 192.4 lb

## 2021-09-18 DIAGNOSIS — G8921 Chronic pain due to trauma: Secondary | ICD-10-CM | POA: Diagnosis not present

## 2021-09-18 DIAGNOSIS — E785 Hyperlipidemia, unspecified: Secondary | ICD-10-CM | POA: Diagnosis not present

## 2021-09-18 DIAGNOSIS — E1169 Type 2 diabetes mellitus with other specified complication: Secondary | ICD-10-CM

## 2021-09-18 DIAGNOSIS — M25552 Pain in left hip: Secondary | ICD-10-CM | POA: Diagnosis not present

## 2021-09-18 LAB — POCT GLYCOSYLATED HEMOGLOBIN (HGB A1C): Hemoglobin A1C: 6.6 % — AB (ref 4.0–5.6)

## 2021-09-18 MED ORDER — SEMAGLUTIDE (1 MG/DOSE) 4 MG/3ML ~~LOC~~ SOPN: 1.0000 mg | PEN_INJECTOR | SUBCUTANEOUS | 3 refills | Status: DC

## 2021-09-18 MED ORDER — GLIPIZIDE 5 MG PO TABS: 5.0000 mg | ORAL_TABLET | Freq: Every day | ORAL | Status: DC

## 2021-09-18 MED ORDER — OXYCODONE HCL 5 MG PO TABS: 7.5000 mg | ORAL_TABLET | Freq: Three times a day (TID) | ORAL | 0 refills | Status: DC

## 2021-09-18 NOTE — Progress Notes (Signed)
Established patient visit   Patient: Alison Landry   DOB: February 06, 1971   50 y.o. Female  MRN: [id number] Visit Date: 09/18/2021  Today's healthcare provider: Lelon Huh, MD   No chief complaint on file.  Subjective    HPI  -Patient has concerns of her hip Diabetes Mellitus Type II, Follow-up  Lab Results  Component Value Date   HGBA1C 6.6 (A) 05/21/2021   HGBA1C 8.5 (A) 02/06/2021   HGBA1C 7.2 (H) 11/01/2020   Wt Readings from Last 3 Encounters:  05/21/21 197 lb 3.2 oz (89.4 kg)  02/06/21 205 lb 9.6 oz (93.3 kg)  10/31/20 204 lb (92.5 kg)   Last seen for diabetes 4 months ago.  Management since then includes continue ozempic. She reports good compliance with treatment. She is not having side effects.  Symptoms: No fatigue No foot ulcerations  No appetite changes No nausea  No paresthesia of the feet  No polydipsia  No polyuria No visual disturbances   No vomiting     Home blood sugar records:  200s  Episodes of hypoglycemia? No    Current insulin regiment: ozempic 0.37m  Most Recent Eye Exam: UTD Current exercise: walking Current diet habits: in general, a "healthy" diet    Pertinent Labs: Lab Results  Component Value Date   CHOL 219 (H) 11/01/2020   HDL 33 (L) 11/01/2020   LDLCALC 126 (H) 11/01/2020   TRIG 339 (H) 11/01/2020   CHOLHDL 6.6 (H) 11/01/2020   Lab Results  Component Value Date   NA 136 02/06/2021   K 4.0 02/06/2021   CREATININE 0.61 02/06/2021   EGFR 111 02/06/2021   GFRNONAA 106 11/01/2020   GLUCOSE 309 (H) 02/06/2021     ---------------------------------------------------------------------------------------------------    She also complains of pain in left inguinal/hip joint getting progressive worse especially with walking over the last several months. Is limiting her walking, and not hurting on the side of her hip as well.     Medications: Outpatient Medications Prior to Visit  Medication Sig   albuterol (VENTOLIN  HFA) 108 (90 Base) MCG/ACT inhaler INHALE 2 PUFFS INTO THE LUNGS EVERY 6 HOURS AS NEEDED FOR WHEEZING OR SHORTNESS OF BREATH   ALPRAZolam (XANAX) 0.5 MG tablet TAKE 1 TABLET(0.5 MG) BY MOUTH DAILY AS NEEDED FOR ANXIETY   amLODipine (NORVASC) 10 MG tablet Take 1 tablet (10 mg total) by mouth daily.   atorvastatin (LIPITOR) 10 MG tablet TAKE 1 TABLET(10 MG) BY MOUTH DAILY   blood glucose meter kit and supplies KIT Dispense based on patient and insurance preference. Use up to four times daily as directed. (FOR ICD-9 250.00, 250.01).   Blood Glucose Monitoring Suppl (ONE TOUCH ULTRA 2) w/Device KIT Use to check sugar daily for type 2 diabetes E11.9   Continuous Blood Gluc Sensor (DEXCOM G6 SENSOR) MISC Place 1 Units onto the skin 4 (four) times daily.   Continuous Blood Gluc Transmit (DEXCOM G6 TRANSMITTER) MISC 1 Units by Does not apply route 4 (four) times daily. Use to check blood sugar four times daily. Replace after 90 days.   glucose blood (ONETOUCH ULTRA) test strip USE TEST STRIPS TO TEST BLOOD SUGAR THREE TIMES DAILY FOR INSULIN DEPENDENT DIABEATES   insulin lispro (HUMALOG) 100 UNIT/ML KwikPen INJECT 8 UNITS UNDER THE SKIN THREE TIMES DAILY   Insulin Pen Needle (BD PEN NEEDLE NANO 2ND GEN) 32G X 4 MM MISC Inject 1 each into the skin 3 (three) times daily. as directed   Lancets ([facility]  DELICA PLUS [id number]) MISC CHECK FASTING BLOOD SUGAR ONCE DAILY AS DIRECTED   metFORMIN (GLUCOPHAGE) 500 MG tablet Take 2 tablets (1,000 mg total) by mouth 2 (two) times daily with a meal. TAKE 2 TABLETS BY MOUTH TWICE DAILY WITH A MEAL   ondansetron (ZOFRAN-ODT) 4 MG disintegrating tablet DISSOLVE 1 TABLET( 4 MG TOTAL) BY MOUTH EVERY 8 HOURS AS NEEDED FOR NAUSEA OR VOMITING   oxyCODONE (OXY IR/ROXICODONE) 5 MG immediate release tablet Take 1.5 tablets (7.5 mg total) by mouth 3 (three) times daily.   pregabalin (LYRICA) 50 MG capsule Take 1 capsule (50 mg total) by mouth 2 (two) times daily. Increase to 2  capsules three time daily after 2 weeks.   tiZANidine (ZANAFLEX) 4 MG tablet TAKE 1 TABLET BY MOUTH EVERY 4 TO 6 HOURS AS NEEDED   traMADol (ULTRAM) 50 MG tablet TAKE 1 TABLET(50 MG) BY MOUTH THREE TIMES DAILY AS NEEDED   [DISCONTINUED] Semaglutide,0.25 or 0.5MG/DOS, (OZEMPIC, 0.25 OR 0.5 MG/DOSE,) 2 MG/1.5ML SOPN Inject 0.5 mg as directed once a week.   glipiZIDE (GLUCOTROL) 10 MG tablet TAKE 1 TABLET(10 MG) BY MOUTH DAILY BEFORE BREAKFAST (Patient not taking: Reported on 09/18/2021)   [DISCONTINUED] nirmatrelvir/ritonavir EUA (PAXLOVID) 20 x 150 MG & 10 x 100MG TABS Take 2 nirmatrelvir (150 mg) tablets and take 1 ritonavir (100 mg) tablet (3 tablets total) by mouth two times daily for 5 days   No facility-administered medications prior to visit.       Objective    There were no vitals taken for this visit. BP Readings from Last 3 Encounters:  05/21/21 124/87  02/06/21 134/85  10/31/20 123/88   Wt Readings from Last 3 Encounters:  05/21/21 197 lb 3.2 oz (89.4 kg)  02/06/21 205 lb 9.6 oz (93.3 kg)  10/31/20 204 lb (92.5 kg)      Physical Exam   General: Appearance:    Mildly obese female in no acute distress  Eyes:    PERRL, conjunctiva/corneas clear, EOM's intact       Lungs:     Clear to auscultation bilaterally, respirations unlabored  Heart:    Normal heart rate. Normal rhythm. No murmurs, rubs, or gallops.    MS:   All extremities are intact.    Neurologic:   Awake, alert, oriented x 3. No apparent focal neurological defect.         Results for orders placed or performed in visit on 09/18/21  POCT HgB A1C  Result Value Ref Range   Hemoglobin A1C 6.6 (A) 4.0 - 5.6 %    Assessment & Plan     1. Type 2 diabetes mellitus with hyperlipidemia ([place]) Fairly well controlled. Will increase Semaglutide, to 1 MG/DOSE, 4 MG/3ML SOPN; Inject 1 mg as directed once a week.  Dispense: 3 mL; Refill: 3  Reduce glipiZIDE (GLUCOTROL) to 5 MG tablet; Take 1 tablet (5 mg total) by mouth  daily before breakfast.  2. Left hip pain  - DG Hip Unilat W OR W/O Pelvis Min 4 Views Left; Future  3. Chronic pain due to trauma refill oxyCODONE (OXY IR/ROXICODONE) 5 MG immediate release tablet; Take 1.5 tablets (7.5 mg total) by mouth 3 (three) times daily.  Dispense: 135 tablet; Refill: 0  Flu vaccine was recommended but refused by patient   Future Appointments  Date Time Provider [place]  12/25/2021  3:00 PM Anaija Wissink, Kirstie Peri, MD [facility]         The entirety of the information documented in  the History of Present Illness, Review of Systems and Physical Exam were personally obtained by me. Portions of this information were initially documented by the CMA and reviewed by me for thoroughness and accuracy.     Donald Fisher, MD  [facility] [phone] (phone) [phone] (fax)  [facility]  

## 2021-09-18 NOTE — Patient Instructions (Addendum)
Please review the attached list of medications and notify my office if there are any errors.  ° °Go to the [facility] on [address] for left hip Xray  °

## 2021-10-15 ENCOUNTER — Telehealth: Payer: Self-pay | Admitting: Family Medicine

## 2021-10-15 DIAGNOSIS — E781 Pure hyperglyceridemia: Secondary | ICD-10-CM

## 2021-10-15 MED ORDER — ATORVASTATIN CALCIUM 10 MG PO TABS: 10.0000 mg | ORAL_TABLET | Freq: Every day | ORAL | 1 refills | Status: DC

## 2021-10-15 NOTE — Telephone Encounter (Signed)
[facility] faxed refill request for the following medications:  atorvastatin (LIPITOR) 10 MG tablet   Please advise.  

## 2021-10-22 ENCOUNTER — Other Ambulatory Visit: Payer: Self-pay | Admitting: Family Medicine

## 2021-10-22 DIAGNOSIS — G8921 Chronic pain due to trauma: Secondary | ICD-10-CM

## 2021-10-22 NOTE — Telephone Encounter (Signed)
Requested medication (s) are due for refill today - yes  Requested medication (s) are on the active medication list -yes  Future visit scheduled -yes  Last refill: 09/18/21 #135  Notes to clinic: Request RF: non delegated Rx  Requested Prescriptions  Pending Prescriptions Disp Refills   oxyCODONE (OXY IR/ROXICODONE) 5 MG immediate release tablet 135 tablet 0    Sig: Take 1.5 tablets (7.5 mg total) by mouth 3 (three) times daily.     Not Delegated - Analgesics:  Opioid Agonists Failed - 10/22/2021  9:56 AM      Failed - This refill cannot be delegated      Failed - Urine Drug Screen completed in last 360 days      Passed - Valid encounter within last 6 months    Recent Outpatient Visits           1 month ago Type 2 diabetes mellitus with hyperlipidemia [facility])   [facility] Malva Limes, MD   5 months ago Type 2 diabetes mellitus with hyperlipidemia [facility])   [facility] Malva Limes, MD   8 months ago Type 2 diabetes mellitus with hyperlipidemia [facility])   [facility] Malva Limes, MD   11 months ago Type 2 diabetes mellitus with hyperlipidemia [facility])   [facility] Malva Limes, MD   1 year ago Pneumonia due to COVID-19 virus   [facility] Malva Limes, MD       Future Appointments             In 2 months Fisher, Demetrios Isaacs, MD [facility], [facility]               Requested Prescriptions  Pending Prescriptions Disp Refills   oxyCODONE (OXY IR/ROXICODONE) 5 MG immediate release tablet 135 tablet 0    Sig: Take 1.5 tablets (7.5 mg total) by mouth 3 (three) times daily.     Not Delegated - Analgesics:  Opioid Agonists Failed - 10/22/2021  9:56 AM      Failed - This refill cannot be delegated      Failed - Urine Drug Screen completed in last 360 days      Passed - Valid encounter within last 6 months    Recent Outpatient Visits           1 month ago Type 2  diabetes mellitus with hyperlipidemia [facility])   [facility] Malva Limes, MD   5 months ago Type 2 diabetes mellitus with hyperlipidemia [facility])   [facility] Malva Limes, MD   8 months ago Type 2 diabetes mellitus with hyperlipidemia [facility])   [facility] Malva Limes, MD   11 months ago Type 2 diabetes mellitus with hyperlipidemia [facility])   [facility] Malva Limes, MD   1 year ago Pneumonia due to COVID-19 virus   [facility] Fisher, Demetrios Isaacs, MD       Future Appointments             In 2 months Fisher, Demetrios Isaacs, MD [facility], [facility]

## 2021-10-22 NOTE — Telephone Encounter (Signed)
Medication Refill - Medication: oxyCODONE (OXY IR/ROXICODONE) 5 MG immediate release tablet  Has the patient contacted their pharmacy? Yes.    (Agent: If yes, when and what did the pharmacy advise?) Contact PCP office due to medication being controlled  Preferred Pharmacy (with phone number or street name):  [facility] #[id number] Nicholes Rough, [place] - [address] AT [facility] [address] Phone:  [phone]  Fax:  [phone]      Has the patient been seen for an appointment in the last year OR does the patient have an upcoming appointment? Yes.    Agent: Please be advised that RX refills may take up to 3 business days. We ask that you follow-up with your pharmacy.

## 2021-10-23 MED ORDER — OXYCODONE HCL 5 MG PO TABS: 7.5000 mg | ORAL_TABLET | Freq: Three times a day (TID) | ORAL | 0 refills | Status: DC

## 2021-11-12 ENCOUNTER — Telehealth: Payer: Self-pay | Admitting: Family Medicine

## 2021-11-12 DIAGNOSIS — E119 Type 2 diabetes mellitus without complications: Secondary | ICD-10-CM

## 2021-11-12 MED ORDER — METFORMIN HCL 500 MG PO TABS: 1000.0000 mg | ORAL_TABLET | Freq: Two times a day (BID) | ORAL | 1 refills | Status: DC

## 2021-11-12 MED ORDER — ONETOUCH ULTRA VI STRP: ORAL_STRIP | 4 refills | Status: DC

## 2021-11-12 NOTE — Telephone Encounter (Signed)
[facility] faxed refill request for the following medications:   metFORMIN (GLUCOPHAGE) 500 MG tablet    Please advise.  

## 2021-11-12 NOTE — Telephone Encounter (Signed)
[facility] faxed refill request for the following medications:  glucose blood (ONETOUCH ULTRA) test strip   Please advise.

## 2021-11-19 ENCOUNTER — Other Ambulatory Visit: Payer: Self-pay | Admitting: Family Medicine

## 2021-11-19 DIAGNOSIS — G8921 Chronic pain due to trauma: Secondary | ICD-10-CM

## 2021-11-19 NOTE — Telephone Encounter (Signed)
Medication: oxyCODONE (OXY IR/ROXICODONE) 5 MG immediate release tablet [[id number]]   Has the patient contacted their pharmacy? YES Advised to contact the office (Agent: If no, request that the patient contact the pharmacy for the refill. If patient does not wish to contact the pharmacy document the reason why and proceed with request.) (Agent: If yes, when and what did the pharmacy advise?)  Preferred Pharmacy (with phone number or street name): [facility] #[id number] Nicholes Rough, [place] - [address] AT [facility] OF [address] [place] [address] Phone: [phone] Fax: [phone] Hours: Not open 24 hours   Has the patient been seen for an appointment in the last year OR does the patient have an upcoming appointment? YES 12/25/20  Agent: Please be advised that RX refills may take up to 3 business days. We ask that you follow-up with your pharmacy.

## 2021-11-20 NOTE — Telephone Encounter (Signed)
Requested medication (s) are due for refill today: Yes  Requested medication (s) are on the active medication list: Yes  Last refill:  10/23/21  Future visit scheduled: Yes  Notes to clinic:  See request.    Requested Prescriptions  Pending Prescriptions Disp Refills   oxyCODONE (OXY IR/ROXICODONE) 5 MG immediate release tablet 135 tablet 0    Sig: Take 1.5 tablets (7.5 mg total) by mouth 3 (three) times daily.     Not Delegated - Analgesics:  Opioid Agonists Failed - 11/20/2021  3:06 PM      Failed - This refill cannot be delegated      Failed - Urine Drug Screen completed in last 360 days      Passed - Valid encounter within last 6 months    Recent Outpatient Visits           2 months ago Type 2 diabetes mellitus with hyperlipidemia [facility])   [facility] Malva Limes, MD   6 months ago Type 2 diabetes mellitus with hyperlipidemia [facility])   [facility] Malva Limes, MD   9 months ago Type 2 diabetes mellitus with hyperlipidemia [facility])   [facility] Malva Limes, MD   1 year ago Type 2 diabetes mellitus with hyperlipidemia [facility])   [facility] Malva Limes, MD   1 year ago Pneumonia due to COVID-19 virus   [facility] Fisher, Demetrios Isaacs, MD       Future Appointments             In 1 month Fisher, Demetrios Isaacs, MD [facility], [facility]

## 2021-11-21 MED ORDER — OXYCODONE HCL 5 MG PO TABS: 7.5000 mg | ORAL_TABLET | Freq: Three times a day (TID) | ORAL | 0 refills | Status: DC

## 2021-12-13 ENCOUNTER — Other Ambulatory Visit: Payer: Self-pay | Admitting: Family Medicine

## 2021-12-13 DIAGNOSIS — I10 Essential (primary) hypertension: Secondary | ICD-10-CM

## 2021-12-13 NOTE — Telephone Encounter (Signed)
Requested Prescriptions  Pending Prescriptions Disp Refills   amLODipine (NORVASC) 10 MG tablet [Pharmacy Med Name: AMLODIPINE BESYLATE 10MG  TABLETS] 90 tablet 0    Sig: TAKE 1 TABLET(10 MG) BY MOUTH DAILY     Cardiovascular:  Calcium Channel Blockers Failed - 12/13/2021  9:37 PM      Failed - Last BP in normal range    BP Readings from Last 1 Encounters:  09/18/21 (!) 152/86         Passed - Valid encounter within last 6 months    Recent Outpatient Visits          2 months ago Type 2 diabetes mellitus with hyperlipidemia [facility])   [facility], MD   6 months ago Type 2 diabetes mellitus with hyperlipidemia [facility])   [facility], MD   10 months ago Type 2 diabetes mellitus with hyperlipidemia [facility])   [facility], MD   1 year ago Type 2 diabetes mellitus with hyperlipidemia [facility])   [facility], MD   1 year ago Pneumonia due to COVID-19 virus   [facility] Fisher, [facility], MD      Future Appointments            In 1 week Fisher, Demetrios Isaacs, MD [facility]

## 2021-12-20 ENCOUNTER — Other Ambulatory Visit: Payer: Self-pay | Admitting: Family Medicine

## 2021-12-20 DIAGNOSIS — G8921 Chronic pain due to trauma: Secondary | ICD-10-CM

## 2021-12-20 MED ORDER — OXYCODONE HCL 5 MG PO TABS: 7.5000 mg | ORAL_TABLET | Freq: Three times a day (TID) | ORAL | 0 refills | Status: DC

## 2021-12-20 NOTE — Telephone Encounter (Signed)
Medication Refill - Medication: oxyCODONE (OXY IR/ROXICODONE) 5 MG immediate release tablet   Has the patient contacted their pharmacy? No.   Preferred Pharmacy (with phone number or street name): [id number] - [place], [place] - [address] AT [address]  Has the patient been seen for an appointment in the last year OR does the patient have an upcoming appointment? Yes.    Agent: Please be advised that RX refills may take up to 3 business days. We ask that you follow-up with your pharmacy.  

## 2021-12-20 NOTE — Telephone Encounter (Signed)
Requested medication (s) are due for refill today: Yes  Requested medication (s) are on the active medication list: Yes  Last refill:  11/21/21  Future visit scheduled: Yes  Notes to clinic:  See request.    Requested Prescriptions  Pending Prescriptions Disp Refills   oxyCODONE (OXY IR/ROXICODONE) 5 MG immediate release tablet 135 tablet 0    Sig: Take 1.5 tablets (7.5 mg total) by mouth 3 (three) times daily.     Not Delegated - Analgesics:  Opioid Agonists Failed - 12/20/2021 11:31 AM      Failed - This refill cannot be delegated      Failed - Urine Drug Screen completed in last 360 days      Passed - Valid encounter within last 6 months    Recent Outpatient Visits           3 months ago Type 2 diabetes mellitus with hyperlipidemia [facility])   [facility] Malva Limes, MD   7 months ago Type 2 diabetes mellitus with hyperlipidemia [facility])   [facility] Malva Limes, MD   10 months ago Type 2 diabetes mellitus with hyperlipidemia [facility])   [facility] Limes, MD   1 year ago Type 2 diabetes mellitus with hyperlipidemia [facility])   [facility] Malva Limes, MD   1 year ago Pneumonia due to COVID-19 virus   [facility] Fisher, Demetrios Isaacs, MD       Future Appointments             In 5 days Fisher, Demetrios Isaacs, MD [facility]

## 2021-12-24 ENCOUNTER — Other Ambulatory Visit: Payer: Self-pay

## 2021-12-24 ENCOUNTER — Other Ambulatory Visit: Payer: Self-pay | Admitting: Family Medicine

## 2021-12-24 ENCOUNTER — Telehealth: Payer: Self-pay | Admitting: Family Medicine

## 2021-12-24 DIAGNOSIS — E1169 Type 2 diabetes mellitus with other specified complication: Secondary | ICD-10-CM

## 2021-12-24 DIAGNOSIS — I10 Essential (primary) hypertension: Secondary | ICD-10-CM

## 2021-12-24 DIAGNOSIS — K2100 Gastro-esophageal reflux disease with esophagitis, without bleeding: Secondary | ICD-10-CM

## 2021-12-24 MED ORDER — SEMAGLUTIDE (1 MG/DOSE) 4 MG/3ML ~~LOC~~ SOPN: 1.0000 mg | PEN_INJECTOR | SUBCUTANEOUS | 3 refills | Status: DC

## 2021-12-24 NOTE — Telephone Encounter (Signed)
[facility] faxed refill request for the following medications:  OZEMPIC, 0.25 OR 0.5 MG/DOSE, 2 MG/1.5ML SOPN   Please advise.  

## 2021-12-25 ENCOUNTER — Ambulatory Visit: Payer: Medicare Other | Admitting: Family Medicine

## 2021-12-25 ENCOUNTER — Telehealth: Payer: Self-pay | Admitting: Family Medicine

## 2021-12-25 MED ORDER — ALBUTEROL SULFATE HFA 108 (90 BASE) MCG/ACT IN AERS: 2.0000 | INHALATION_SPRAY | Freq: Four times a day (QID) | RESPIRATORY_TRACT | 1 refills | Status: DC | PRN

## 2021-12-25 MED ORDER — AMLODIPINE BESYLATE 10 MG PO TABS: ORAL_TABLET | ORAL | 4 refills | Status: DC

## 2021-12-25 NOTE — Progress Notes (Deleted)
Established patient visit   Patient: Alison Landry   DOB: 1971-04-17   51 y.o. Female  MRN: [id number] Visit Date: 12/25/2021  Today's healthcare provider: Lelon Huh, MD   No chief complaint on file.  Subjective    HPI  Diabetes Mellitus Type II, Follow-up  Lab Results  Component Value Date   HGBA1C 6.6 (A) 09/18/2021   HGBA1C 6.6 (A) 05/21/2021   HGBA1C 8.5 (A) 02/06/2021   Wt Readings from Last 3 Encounters:  09/18/21 192 lb 6.4 oz (87.3 kg)  05/21/21 197 lb 3.2 oz (89.4 kg)  02/06/21 205 lb 9.6 oz (93.3 kg)   Last seen for diabetes 3 months ago. (02/16/21) Management since then includes increase Semaglutide, to 1 MG/DOSE, 4 MG/3ML SOPN; Inject 1 mg as directed once a week.  Dispense: 3 mL; Refill: 3 Reduce glipiZIDE (GLUCOTROL) to 5 MG tablet; Take 1 tablet (5 mg total) by mouth daily before breakfast. She reports {excellent/good/fair/poor:19665} compliance with treatment. She {is/is not:21021397} having side effects. {document side effects if present:1} Symptoms: {Yes/No:20286} fatigue {Yes/No:20286} foot ulcerations  {Yes/No:20286} appetite changes {Yes/No:20286} nausea  {Yes/No:20286} paresthesia of the feet  {Yes/No:20286} polydipsia  {Yes/No:20286} polyuria {Yes/No:20286} visual disturbances   {Yes/No:20286} vomiting     Home blood sugar records: {diabetes glucometry results:16657}  Episodes of hypoglycemia? {Yes/No:20286} {enter symptoms and frequency of symptoms if yes:1}   Current insulin regiment: {enter 'none' or type of insulin and number of units taken with each dose of each insulin formulation that the patient is taking:1} Most Recent Eye Exam: *** {Current exercise:16438:::1} {Current diet habits:16563:::1}  Pertinent Labs: Lab Results  Component Value Date   CHOL 219 (H) 11/01/2020   HDL 33 (L) 11/01/2020   LDLCALC 126 (H) 11/01/2020   TRIG 339 (H) 11/01/2020   CHOLHDL 6.6 (H) 11/01/2020   Lab Results  Component Value Date   NA  136 02/06/2021   K 4.0 02/06/2021   CREATININE 0.61 02/06/2021   EGFR 111 02/06/2021   MICROALBUR negative 02/06/2021   LABMICR See below: 01/25/2020     ---------------------------------------------------------------------------------------------------   Medications: Outpatient Medications Prior to Visit  Medication Sig   albuterol (VENTOLIN HFA) 108 (90 Base) MCG/ACT inhaler INHALE 2 PUFFS INTO THE LUNGS EVERY 6 HOURS AS NEEDED FOR WHEEZING OR SHORTNESS OF BREATH   ALPRAZolam (XANAX) 0.5 MG tablet TAKE 1 TABLET(0.5 MG) BY MOUTH DAILY AS NEEDED FOR ANXIETY   amLODipine (NORVASC) 10 MG tablet TAKE 1 TABLET(10 MG) BY MOUTH DAILY   atorvastatin (LIPITOR) 10 MG tablet Take 1 tablet (10 mg total) by mouth daily.   blood glucose meter kit and supplies KIT Dispense based on patient and insurance preference. Use up to four times daily as directed. (FOR ICD-9 250.00, 250.01).   Blood Glucose Monitoring Suppl (ONE TOUCH ULTRA 2) w/Device KIT Use to check sugar daily for type 2 diabetes E11.9   Continuous Blood Gluc Sensor (DEXCOM G6 SENSOR) MISC Place 1 Units onto the skin 4 (four) times daily.   Continuous Blood Gluc Transmit (DEXCOM G6 TRANSMITTER) MISC 1 Units by Does not apply route 4 (four) times daily. Use to check blood sugar four times daily. Replace after 90 days.   glipiZIDE (GLUCOTROL) 5 MG tablet Take 1 tablet (5 mg total) by mouth daily before breakfast.   glucose blood (ONETOUCH ULTRA) test strip USE TEST STRIPS TO TEST BLOOD SUGAR THREE TIMES DAILY FOR INSULIN DEPENDENT DIABEATES   insulin lispro (HUMALOG) 100 UNIT/ML KwikPen INJECT 8 UNITS UNDER THE  SKIN THREE TIMES DAILY   Insulin Pen Needle (BD PEN NEEDLE NANO 2ND GEN) 32G X 4 MM MISC Inject 1 each into the skin 3 (three) times daily. as directed   Lancets (ONETOUCH DELICA PLUS [id number]) MISC CHECK FASTING BLOOD SUGAR ONCE DAILY AS DIRECTED   metFORMIN (GLUCOPHAGE) 500 MG tablet Take 2 tablets (1,000 mg total) by mouth 2 (two)  times daily with a meal. TAKE 2 TABLETS BY MOUTH TWICE DAILY WITH A MEAL   ondansetron (ZOFRAN-ODT) 4 MG disintegrating tablet DISSOLVE 1 TABLET(4 MG) ON THE TONGUE EVERY 8 HOURS AS NEEDED FOR NAUSEA OR VOMITING   oxyCODONE (OXY IR/ROXICODONE) 5 MG immediate release tablet Take 1.5 tablets (7.5 mg total) by mouth 3 (three) times daily.   pregabalin (LYRICA) 50 MG capsule Take 1 capsule (50 mg total) by mouth 2 (two) times daily. Increase to 2 capsules three time daily after 2 weeks.   Semaglutide, 1 MG/DOSE, 4 MG/3ML SOPN Inject 1 mg as directed once a week.   tiZANidine (ZANAFLEX) 4 MG tablet TAKE 1 TABLET BY MOUTH EVERY 4 TO 6 HOURS AS NEEDED   traMADol (ULTRAM) 50 MG tablet TAKE 1 TABLET(50 MG) BY MOUTH THREE TIMES DAILY AS NEEDED   No facility-administered medications prior to visit.    Review of Systems  {Labs   Heme   Chem   Endocrine   Serology   Results Review (optional):23779}   Objective    There were no vitals taken for this visit. {Show previous vital signs (optional):23777}  Physical Exam  ***  No results found for any visits on 12/25/21.  Assessment & Plan     ***  No follow-ups on file.      {provider attestation***:1}   Lelon Huh, MD  [facility] [phone] (phone) [phone] (fax)  [place]

## 2021-12-25 NOTE — Telephone Encounter (Signed)
Amlodipine refilled 12/13/2021 #90 Requested Prescriptions  Pending Prescriptions Disp Refills   ondansetron (ZOFRAN-ODT) 4 MG disintegrating tablet [Pharmacy Med Name: ONDANSETRON ODT 4MG  TABLETS] 20 tablet 2    Sig: DISSOLVE 1 TABLET(4 MG) ON THE TONGUE EVERY 8 HOURS AS NEEDED FOR NAUSEA OR VOMITING     Not Delegated - Gastroenterology: Antiemetics Failed - 12/24/2021  9:28 PM      Failed - This refill cannot be delegated      Passed - Valid encounter within last 6 months    Recent Outpatient Visits          3 months ago Type 2 diabetes mellitus with hyperlipidemia [facility])   [facility], MD   7 months ago Type 2 diabetes mellitus with hyperlipidemia [facility])   [facility], MD   10 months ago Type 2 diabetes mellitus with hyperlipidemia [facility])   [facility], MD   1 year ago Type 2 diabetes mellitus with hyperlipidemia [facility])   [facility], MD   1 year ago Pneumonia due to COVID-19 virus   [facility] Fisher, [facility], MD      Future Appointments            Today Fisher, Demetrios Isaacs, MD [facility]            amLODipine (NORVASC) 10 MG tablet [Pharmacy Med Name: AMLODIPINE BESYLATE 10MG  TABLETS] 90 tablet 0    Sig: TAKE 1 TABLET(10 MG) BY MOUTH DAILY     Cardiovascular:  Calcium Channel Blockers Failed - 12/24/2021  9:28 PM      Failed - Last BP in normal range    BP Readings from Last 1 Encounters:  09/18/21 (!) 152/86         Passed - Valid encounter within last 6 months    Recent Outpatient Visits          3 months ago Type 2 diabetes mellitus with hyperlipidemia [facility])   [facility], MD   7 months ago Type 2 diabetes mellitus with hyperlipidemia [facility])   [facility], MD   10 months ago Type 2 diabetes mellitus with hyperlipidemia [facility])   [facility], MD   1 year ago Type 2 diabetes mellitus with hyperlipidemia [facility])   [facility], MD   1 year ago Pneumonia due to COVID-19 virus   [facility] Fisher, Malva Limes, MD      Future Appointments            Today Fisher, [facility], MD [facility], [facility]

## 2021-12-25 NOTE — Telephone Encounter (Signed)
[facility] faxed refill request for the following medications:   albuterol (VENTOLIN HFA) 108 (90 Base) MCG/ACT inhaler   Please advise.  

## 2021-12-25 NOTE — Telephone Encounter (Signed)
Requested medications are due for refill today.  yes  Requested medications are on the active medications list.  yes  Last refill. 08/21/2021  Future visit scheduled.   yes  Notes to clinic.  Medication not delegated.    Requested Prescriptions  Pending Prescriptions Disp Refills   ondansetron (ZOFRAN-ODT) 4 MG disintegrating tablet [Pharmacy Med Name: ONDANSETRON ODT 4MG  TABLETS] 20 tablet 2    Sig: DISSOLVE 1 TABLET(4 MG) ON THE TONGUE EVERY 8 HOURS AS NEEDED FOR NAUSEA OR VOMITING     Not Delegated - Gastroenterology: Antiemetics Failed - 12/24/2021  9:28 PM      Failed - This refill cannot be delegated      Passed - Valid encounter within last 6 months    Recent Outpatient Visits           3 months ago Type 2 diabetes mellitus with hyperlipidemia [facility])   [facility], MD   7 months ago Type 2 diabetes mellitus with hyperlipidemia [facility])   [facility], MD   10 months ago Type 2 diabetes mellitus with hyperlipidemia [facility])   [facility], MD   1 year ago Type 2 diabetes mellitus with hyperlipidemia [facility])   [facility], MD   1 year ago Pneumonia due to COVID-19 virus   [facility] Fisher, [facility], MD       Future Appointments             Today Fisher, Demetrios Isaacs, MD [facility]            Refused Prescriptions Disp Refills   amLODipine (NORVASC) 10 MG tablet [Pharmacy Med Name: AMLODIPINE BESYLATE 10MG  TABLETS] 90 tablet 0    Sig: TAKE 1 TABLET(10 MG) BY MOUTH DAILY     Cardiovascular:  Calcium Channel Blockers Failed - 12/24/2021  9:28 PM      Failed - Last BP in normal range    BP Readings from Last 1 Encounters:  09/18/21 (!) 152/86          Passed - Valid encounter within last 6 months    Recent Outpatient Visits           3 months ago Type 2 diabetes mellitus with hyperlipidemia [facility])    [facility], MD   7 months ago Type 2 diabetes mellitus with hyperlipidemia [facility])   [facility], MD   10 months ago Type 2 diabetes mellitus with hyperlipidemia [facility])   [facility], MD   1 year ago Type 2 diabetes mellitus with hyperlipidemia [facility])   [facility], MD   1 year ago Pneumonia due to COVID-19 virus   [facility] Fisher, Malva Limes, MD       Future Appointments             Today Fisher, [facility], MD [facility], [facility]

## 2021-12-26 ENCOUNTER — Telehealth: Payer: Self-pay | Admitting: Family Medicine

## 2021-12-26 ENCOUNTER — Other Ambulatory Visit: Payer: Self-pay | Admitting: Family Medicine

## 2021-12-26 ENCOUNTER — Ambulatory Visit: Payer: Self-pay

## 2021-12-26 DIAGNOSIS — E785 Hyperlipidemia, unspecified: Secondary | ICD-10-CM

## 2021-12-26 MED ORDER — SEMAGLUTIDE (1 MG/DOSE) 4 MG/3ML ~~LOC~~ SOPN: 1.0000 mg | PEN_INJECTOR | SUBCUTANEOUS | 3 refills | Status: DC

## 2021-12-26 NOTE — Telephone Encounter (Signed)
Third attempt to reach pt. Left message to call back. 

## 2021-12-26 NOTE — Telephone Encounter (Signed)
[facility] Semaglutide, 1 MG/DOSE, 4 MG/3ML SOPN. (Trulicity) it is on backorder everywhere. But [facility].  Pt was supposed tp take on Monday. So today her sugar is 300.  [facility] because [facility] "in process". No one will answer phone at [facility].  So pt requesting a [facility].  Reason for note, pt sugar is 300.          Left message on cell phone to call back. No answer on home phone.

## 2021-12-26 NOTE — Telephone Encounter (Addendum)
[facility] Semaglutide, 1 MG/DOSE, 4 MG/3ML SOPN. (Trulicity) it is on backorder everywhere. But [facility] one.  Pt was supposed tp take on Monday. So today her sugar is 300.  [facility] because [facility] it [facility]". No [facility] will answer phone at [facility].  So pt requesting a [facility].    Requested Prescriptions  Pending Prescriptions Disp Refills   Semaglutide, 1 MG/DOSE, 4 MG/3ML SOPN 3 mL 3    Sig: Inject 1 mg as directed once a week.     There is no refill protocol information for this order

## 2021-12-26 NOTE — Telephone Encounter (Signed)
Patient called, left VM to return the call to the office to speak to a nurse.   Summary: elevated blood sugar 300   [facility] Semaglutide, 1 MG/DOSE, 4 MG/3ML SOPN. (Trulicity) it is on backorder everywhere. But [facility] one.  Pt was supposed tp take on Monday. So today her sugar is 300.  [facility] because [facility] it [facility]". No one will answer phone at [facility].  So pt requesting a [facility].  Reason for note, pt sugar is 300.

## 2021-12-26 NOTE — Telephone Encounter (Signed)
Christine from [facility] called requesting a prescription be sent to them for   [facility], 1 MG/DOSE, 4 MG/3ML SOPN [id number] They state [facility] of this med and they have it in stock  Phone: [phone]

## 2021-12-26 NOTE — Telephone Encounter (Signed)
[facility]. Tried calling patient. No answer.

## 2021-12-26 NOTE — Telephone Encounter (Signed)
Already refilled in another encounter today

## 2022-01-21 ENCOUNTER — Other Ambulatory Visit: Payer: Self-pay | Admitting: Family Medicine

## 2022-01-21 DIAGNOSIS — G8921 Chronic pain due to trauma: Secondary | ICD-10-CM

## 2022-01-21 NOTE — Telephone Encounter (Signed)
Medication Refill - Medication: oxyCODONE (OXY IR/ROXICODONE) 5 MG immediate release tablet   / Pt states its a control substance that requires her to call the office to request   Has the patient contacted their pharmacy? No. (Agent: If no, request that the patient contact the pharmacy for the refill. If patient does not wish to contact the pharmacy document the reason why and proceed with request.) (Agent: If yes, when and what did the pharmacy advise?)  Preferred Pharmacy (with phone number or street name): [id number] [place], [place] - [address] AT [facility] OF [address]  [address] [place], [place] [address]  Phone:  [phone]  Fax:  [phone]  Has the patient been seen for an appointment in the last year OR does the patient have an upcoming appointment? Yes.    Agent: Please be advised that RX refills may take up to 3 business days. We ask that you follow-up with your pharmacy.

## 2022-01-22 MED ORDER — OXYCODONE HCL 5 MG PO TABS: 7.5000 mg | ORAL_TABLET | Freq: Three times a day (TID) | ORAL | 0 refills | Status: DC

## 2022-01-22 NOTE — Telephone Encounter (Signed)
Requested medication (s) are due for refill today - yes  Requested medication (s) are on the active medication list -yes  Future visit scheduled -yes  Last refill: 12/20/21 #135  Notes to clinic: Request RF: non delegated Rx  Requested Prescriptions  Pending Prescriptions Disp Refills   oxyCODONE (OXY IR/ROXICODONE) 5 MG immediate release tablet 135 tablet 0    Sig: Take 1.5 tablets (7.5 mg total) by mouth 3 (three) times daily.     Not Delegated - Analgesics:  Opioid Agonists Failed - 01/21/2022 11:14 AM      Failed - This refill cannot be delegated      Failed - Urine Drug Screen completed in last 360 days      Failed - Valid encounter within last 3 months    Recent Outpatient Visits           4 months ago Type 2 diabetes mellitus with hyperlipidemia [facility])   [facility]imes, MD   8 months ago Type 2 diabetes mellitus with hyperlipidemia [facility])   [facility] Malva Limes, MD   11 months ago Type 2 diabetes mellitus with hyperlipidemia [facility])   [facility] Mal[facility] Limes, MD   1 year ago Type 2 diabetes mellitus with hyperlipidemia [facility])   [facility] Malva Limes, MD   1 year ago Pneumonia due to COVID-19 virus   [facility] Malva Limes, MD       Future Appointments             In 1 month Fisher, Demetrios Isaacs, MD [facility]               Requested Prescriptions  Pending Prescriptions Disp Refills   oxyCODONE (OXY IR/ROXICODONE) 5 MG immediate release tablet 135 tablet 0    Sig: Take 1.5 tablets (7.5 mg total) by mouth 3 (three) times daily.     Not Delegated - Analgesics:  Opioid Agonists Failed - 01/21/2022 11:14 AM      Failed - This refill cannot be delegated      Failed - Urine Drug Screen completed in last 360 days      Failed - Valid encounter within last 3 months    Recent Outpatient Visits           4 months ago Type 2 diabetes  mellitus with hyperlipidemia [facility])   [facility], MD   8 months ago Type 2 diabetes mellitus with hyperlipidemia [facility])   [facility] Malva Limes, MD   11 months ago Type 2 diabetes mellitus with hyperlipidemia [facility])   [facility] Malva Limes, MD   1 year ago Type 2 diabetes mellitus with hyperlipidemia [facility])   [facility] Malva Limes, MD   1 year ago Pneumonia due to COVID-19 virus   [facility] Fisher, Demetrios Isaacs, MD       Future Appointments             In 1 month Fisher, Demetrios Isaacs, MD [facility]

## 2022-01-29 ENCOUNTER — Ambulatory Visit (INDEPENDENT_AMBULATORY_CARE_PROVIDER_SITE_OTHER): Payer: Medicare Other

## 2022-01-29 ENCOUNTER — Telehealth: Payer: Self-pay

## 2022-01-29 DIAGNOSIS — Z0000 Encounter for general adult medical examination without abnormal findings: Secondary | ICD-10-CM | POA: Diagnosis not present

## 2022-01-29 DIAGNOSIS — Z1211 Encounter for screening for malignant neoplasm of colon: Secondary | ICD-10-CM | POA: Diagnosis not present

## 2022-01-29 DIAGNOSIS — Z1231 Encounter for screening mammogram for malignant neoplasm of breast: Secondary | ICD-10-CM

## 2022-01-29 NOTE — Telephone Encounter (Signed)
CALLED PATIENT NO ANSWER LEFT VOICEMAIL FOR A CALL BACK ? ?
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
## 2022-01-29 NOTE — Patient Instructions (Addendum)
Alison Landry , Thank you for taking time to come for your Medicare Wellness Visit. I appreciate your ongoing commitment to your health goals. Please review the following plan we discussed and let me know if I can assist you in the future.   Screening recommendations/referrals: Colonoscopy: referral sent Mammogram: referral sent Bone Density: n/d Recommended yearly ophthalmology/optometry visit for glaucoma screening and checkup Recommended yearly dental visit for hygiene and checkup  Vaccinations: Influenza vaccine: n/d Pneumococcal vaccine: n/d Tdap vaccine: n/d Shingles vaccine: n/d  Covid-19: n/d  Advanced directives: no  Conditions/risks identified: none  Next appointment: Follow up in one year for your annual wellness visit. - 01/30/23 @ 8:20am by phone  Preventive Care 40-64 Years, Female Preventive care refers to lifestyle choices and visits with your health care provider that can promote health and wellness. What does preventive care include? A yearly physical exam. This is also called an annual well check. Dental exams once or twice a year. Routine eye exams. Ask your health care provider how often you should have your eyes checked. Personal lifestyle choices, including: Daily care of your teeth and gums. Regular physical activity. Eating a healthy diet. Avoiding tobacco and drug use. Limiting alcohol use. Practicing safe sex. Taking low-dose aspirin daily starting at age 52. Taking vitamin and mineral supplements as recommended by your health care provider. What happens during an annual well check? The services and screenings done by your health care provider during your annual well check will depend on your age, overall health, lifestyle risk factors, and family history of disease. Counseling  Your health care provider may ask you questions about your: Alcohol use. Tobacco use. Drug use. Emotional well-being. Home and relationship well-being. Sexual  activity. Eating habits. Work and work Statistician. Method of birth control. Menstrual cycle. Pregnancy history. Screening  You may have the following tests or measurements: Height, weight, and BMI. Blood pressure. Lipid and cholesterol levels. These may be checked every 5 years, or more frequently if you are over 50 years old. Skin check. Lung cancer screening. You may have this screening every year starting at age 52 if you have a 30-pack-year history of smoking and currently smoke or have quit within the past 15 years. Fecal occult blood test (FOBT) of the stool. You may have this test every year starting at age 32. Flexible sigmoidoscopy or colonoscopy. You may have a sigmoidoscopy every 5 years or a colonoscopy every 10 years starting at age 89. Hepatitis C blood test. Hepatitis B blood test. Sexually transmitted disease (STD) testing. Diabetes screening. This is done by checking your blood sugar (glucose) after you have not eaten for a while (fasting). You may have this done every 1-3 years. Mammogram. This may be done every 1-2 years. Talk to your health care provider about when you should start having regular mammograms. This may depend on whether you have a family history of breast cancer. BRCA-related cancer screening. This may be done if you have a family history of breast, ovarian, tubal, or peritoneal cancers. Pelvic exam and Pap test. This may be done every 3 years starting at age 51. Starting at age 73, this may be done every 5 years if you have a Pap test in combination with an HPV test. Bone density scan. This is done to screen for osteoporosis. You may have this scan if you are at high risk for osteoporosis. Discuss your test results, treatment options, and if necessary, the need for more tests with your health care provider. Vaccines  Your health care provider may recommend certain vaccines, such as: Influenza vaccine. This is recommended every year. Tetanus, diphtheria,  and acellular pertussis (Tdap, Td) vaccine. You may need a Td booster every 10 years. Zoster vaccine. You may need this after age 51. Pneumococcal 13-valent conjugate (PCV13) vaccine. You may need this if you have certain conditions and were not previously vaccinated. Pneumococcal polysaccharide (PPSV23) vaccine. You may need one or two doses if you smoke cigarettes or if you have certain conditions. Talk to your health care provider about which screenings and vaccines you need and how often you need them. This information is not intended to replace advice given to you by your health care provider. Make sure you discuss any questions you have with your health care provider. Document Released: 12/15/2015 Document Revised: 08/07/2016 Document Reviewed: 09/19/2015 Elsevier Interactive Patient Education  2017 [employer] Prevention in the Home Falls can cause injuries. They can happen to people of all ages. There are many things you can do to make your home safe and to help prevent falls. What can I do on the outside of my home? Regularly fix the edges of walkways and driveways and fix any cracks. Remove anything that might make you trip as you walk through a door, such as a raised step or threshold. Trim any bushes or trees on the path to your home. Use bright outdoor lighting. Clear any walking paths of anything that might make someone trip, such as rocks or tools. Regularly check to see if handrails are loose or broken. Make sure that both sides of any steps have handrails. Any raised decks and porches should have guardrails on the edges. Have any leaves, snow, or ice cleared regularly. Use sand or salt on walking paths during winter. Clean up any spills in your garage right away. This includes oil or grease spills. What can I do in the bathroom? Use night lights. Install grab bars by the toilet and in the tub and shower. Do not use towel bars as grab bars. Use non-skid mats or  decals in the tub or shower. If you need to sit down in the shower, use a plastic, non-slip stool. Keep the floor dry. Clean up any water that spills on the floor as soon as it happens. Remove soap buildup in the tub or shower regularly. Attach bath mats securely with double-sided non-slip rug tape. Do not have throw rugs and other things on the floor that can make you trip. What can I do in the bedroom? Use night lights. Make sure that you have a light by your bed that is easy to reach. Do not use any sheets or blankets that are too big for your bed. They should not hang down onto the floor. Have a firm chair that has side arms. You can use this for support while you get dressed. Do not have throw rugs and other things on the floor that can make you trip. What can I do in the kitchen? Clean up any spills right away. Avoid walking on wet floors. Keep items that you use a lot in easy-to-reach places. If you need to reach something above you, use a strong step stool that has a grab bar. Keep electrical cords out of the way. Do not use floor polish or wax that makes floors slippery. If you must use wax, use non-skid floor wax. Do not have throw rugs and other things on the floor that can make you trip. What  can I do with my stairs? Do not leave any items on the stairs. Make sure that there are handrails on both sides of the stairs and use them. Fix handrails that are broken or loose. Make sure that handrails are as long as the stairways. Check any carpeting to make sure that it is firmly attached to the stairs. Fix any carpet that is loose or worn. Avoid having throw rugs at the top or bottom of the stairs. If you do have throw rugs, attach them to the floor with carpet tape. Make sure that you have a light switch at the top of the stairs and the bottom of the stairs. If you do not have them, ask someone to add them for you. What else can I do to help prevent falls? Wear shoes that: Do not  have high heels. Have rubber bottoms. Are comfortable and fit you well. Are closed at the toe. Do not wear sandals. If you use a stepladder: Make sure that it is fully opened. Do not climb a closed stepladder. Make sure that both sides of the stepladder are locked into place. Ask someone to hold it for you, if possible. Clearly mark and make sure that you can see: Any grab bars or handrails. First and last steps. Where the edge of each step is. Use tools that help you move around (mobility aids) if they are needed. These include: Canes. Walkers. Scooters. Crutches. Turn on the lights when you go into a dark area. Replace any light bulbs as soon as they burn out. Set up your furniture so you have a clear path. Avoid moving your furniture around. If any of your floors are uneven, fix them. If there are any pets around you, be aware of where they are. Review your medicines with your doctor. Some medicines can make you feel dizzy. This can increase your chance of falling. Ask your doctor what other things that you can do to help prevent falls. This information is not intended to replace advice given to you by your health care provider. Make sure you discuss any questions you have with your health care provider. Document Released: 09/14/2009 Document Revised: 04/25/2016 Document Reviewed: 12/23/2014 Elsevier Interactive Patient Education  2017 [employer].

## 2022-01-30 ENCOUNTER — Telehealth: Payer: Self-pay

## 2022-01-30 NOTE — Telephone Encounter (Signed)
CALLED PATIENT NO ANSWER LEFT VOICEMAIL FOR A CALL BACK °Letter sent °

## 2022-02-15 ENCOUNTER — Other Ambulatory Visit: Payer: Self-pay | Admitting: Family Medicine

## 2022-02-15 DIAGNOSIS — G8921 Chronic pain due to trauma: Secondary | ICD-10-CM

## 2022-02-15 NOTE — Telephone Encounter (Signed)
Requested medication (s) are due for refill today: Yes ? ?Requested medication (s) are on the active medication list: Yes ? ?Last refill:  01/22/22 ? ?Future visit scheduled: Yes ? ?Notes to clinic:  See request. ? ? ? ?Requested Prescriptions  ?Pending Prescriptions Disp Refills  ? oxyCODONE (OXY IR/ROXICODONE) 5 MG immediate release tablet 135 tablet 0  ?  Sig: Take 1.5 tablets (7.5 mg total) by mouth 3 (three) times daily.  ?  ? Not Delegated - Analgesics:  Opioid Agonists Failed - 02/15/2022  2:00 PM  ?  ?  Failed - This refill cannot be delegated  ?  ?  Failed - Urine Drug Screen completed in last 360 days  ?  ?  Passed - Valid encounter within last 3 months  ?  Recent Outpatient Visits   ? ?      ? 5 months ago Type 2 diabetes mellitus with hyperlipidemia (HCC)  ? [facility] Malva Limes, MD  ? 9 months ago Type 2 diabetes mellitus with hyperlipidemia (HCC)  ? [facility] Malva Limes, MD  ? 1 year ago Type 2 diabetes mellitus with hyperlipidemia (HCC)  ? [facility] Malva Limes, MD  ? 1 year ago Type 2 diabetes mellitus with hyperlipidemia (HCC)  ? [facility] Malva Limes, MD  ? 1 year ago Pneumonia due to COVID-19 virus  ? [facility] Sherrie Mustache, Demetrios Isaacs, MD  ? ?  ?  ?Future Appointments   ? ?        ? In 2 weeks Fisher, Demetrios Isaacs, MD [facility]  ? ?  ? ?  ?  ?  ? ?

## 2022-02-15 NOTE — Telephone Encounter (Signed)
Copied from CRM [id number]. Topic: Quick Communication - Rx Refill/Question ?>> Feb 15, 2022 12:03 PM Gaetana Michaelis A wrote: ?Medication: oxyCODONE (OXY IR/ROXICODONE) 5 MG immediate release tablet [[id number]]  ? ?Has the patient contacted their pharmacy? No. ?(Agent: If no, request that the patient contact the pharmacy for the refill. If patient does not wish to contact the pharmacy document the reason why and proceed with request.) ?(Agent: If yes, when and what did the pharmacy advise?) ? ?Preferred Pharmacy (with phone number or street name): [id number] Nicholes Rough, [place] - [address] AT [facility] OF [address] ?[address] [place] [address] ?Phone: [phone] Fax: [phone] ?Hours: Not open 24 hours ? ?Has the patient been seen for an appointment in the last year OR does the patient have an upcoming appointment? Yes.   ? ?Agent: Please be advised that RX refills may take up to 3 business days. We ask that you follow-up with your pharmacy. ?

## 2022-02-17 MED ORDER — OXYCODONE HCL 5 MG PO TABS: 7.5000 mg | ORAL_TABLET | Freq: Three times a day (TID) | ORAL | 0 refills | Status: DC

## 2022-03-05 ENCOUNTER — Ambulatory Visit: Payer: Medicare Other | Admitting: Family Medicine

## 2022-03-20 ENCOUNTER — Other Ambulatory Visit: Payer: Self-pay | Admitting: Family Medicine

## 2022-03-20 ENCOUNTER — Ambulatory Visit (INDEPENDENT_AMBULATORY_CARE_PROVIDER_SITE_OTHER): Payer: Medicare Other | Admitting: Family Medicine

## 2022-03-20 ENCOUNTER — Encounter: Payer: Self-pay | Admitting: Family Medicine

## 2022-03-20 VITALS — BP 134/87 | HR 102 | Temp 98.0°F | Resp 16 | Wt 192.2 lb

## 2022-03-20 DIAGNOSIS — M25541 Pain in joints of right hand: Secondary | ICD-10-CM

## 2022-03-20 DIAGNOSIS — R232 Flushing: Secondary | ICD-10-CM

## 2022-03-20 DIAGNOSIS — Z1211 Encounter for screening for malignant neoplasm of colon: Secondary | ICD-10-CM

## 2022-03-20 DIAGNOSIS — Z124 Encounter for screening for malignant neoplasm of cervix: Secondary | ICD-10-CM

## 2022-03-20 DIAGNOSIS — I10 Essential (primary) hypertension: Secondary | ICD-10-CM

## 2022-03-20 DIAGNOSIS — E785 Hyperlipidemia, unspecified: Secondary | ICD-10-CM | POA: Diagnosis not present

## 2022-03-20 DIAGNOSIS — M25542 Pain in joints of left hand: Secondary | ICD-10-CM

## 2022-03-20 DIAGNOSIS — E781 Pure hyperglyceridemia: Secondary | ICD-10-CM | POA: Diagnosis not present

## 2022-03-20 DIAGNOSIS — E1169 Type 2 diabetes mellitus with other specified complication: Secondary | ICD-10-CM

## 2022-03-20 DIAGNOSIS — Z1159 Encounter for screening for other viral diseases: Secondary | ICD-10-CM | POA: Diagnosis not present

## 2022-03-20 MED ORDER — MUPIROCIN 2 % EX OINT: 1.0000 "application " | TOPICAL_OINTMENT | Freq: Two times a day (BID) | CUTANEOUS | 2 refills | Status: DC

## 2022-03-20 NOTE — Progress Notes (Signed)
?  ? ?I,Jana Robinson,acting as a scribe for Lelon Huh, MD.,have documented all relevant documentation on the behalf of Lelon Huh, MD,as directed by  Lelon Huh, MD while in the presence of Lelon Huh, MD. ? ? ?Established patient visit ? ? ?Patient: Alison Landry   DOB: 01-03-1971   51 y.o. Female  MRN: [id number] ?Visit Date: 03/20/2022 ? ?Today's healthcare provider: Lelon Huh, MD  ? ?Chief Complaint  ?Patient presents with  ? Diabetes  ? ?Subjective  ?  ?HPI  ?Diabetes Mellitus Type II, Follow-up ? ?Lab Results  ?Component Value Date  ? HGBA1C 6.6 (A) 09/18/2021  ? HGBA1C 6.6 (A) 05/21/2021  ? HGBA1C 8.5 (A) 02/06/2021  ? ?Wt Readings from Last 3 Encounters:  ?03/20/22 192 lb 3.2 oz (87.2 kg)  ?09/18/21 192 lb 6.4 oz (87.3 kg)  ?05/21/21 197 lb 3.2 oz (89.4 kg)  ? ?Last seen for diabetes 6 months ago.  ? ?She reports excellent compliance with treatment. ?She is not having side effects.  ?Symptoms: ?Yes fatigue Yes foot ulcerations  ?No appetite changes Yes nausea  ?No paresthesia of the feet  No polydipsia  ?No polyuria No visual disturbances   ?No vomiting   ? ? ?Home blood sugar records: fasting average 200-224  ? ?Episodes of hypoglycemia? No  ?  ?Current insulin regiment: Patient is not clear on instructions from last visit.  ?Most Recent Eye Exam: within the year per pt  ?Current exercise: walking ?Current diet habits: in general, an "unhealthy" diet ? ?Pertinent Labs: ?Lab Results  ?Component Value Date  ? CHOL 219 (H) 11/01/2020  ? HDL 33 (L) 11/01/2020  ? LDLCALC 126 (H) 11/01/2020  ? TRIG 339 (H) 11/01/2020  ? CHOLHDL 6.6 (H) 11/01/2020  ? Lab Results  ?Component Value Date  ? NA 136 02/06/2021  ? K 4.0 02/06/2021  ? CREATININE 0.61 02/06/2021  ? EGFR 111 02/06/2021  ? MICROALBUR negative 02/06/2021  ? LABMICR See below: 01/25/2020  ?  ? ?--------------------------------------------------------------------------------------------------- . ?Follow up for chronic pain  ? ?The patient was  last seen for this 6 months ago. ?Changes made at last visit include no changes. ? ?She reports good compliance with treatment. ?She feels that condition is Unchanged. ?She is not having side effects.  ? ?-----------------------------------------------------------------------------------------  ?---------------------------------------------------------------------------------------------------  ?Medications: ?Outpatient Medications Prior to Visit  ?Medication Sig  ? albuterol (VENTOLIN HFA) 108 (90 Base) MCG/ACT inhaler Inhale 2 puffs into the lungs every 6 (six) hours as needed for wheezing or shortness of breath.  ? ALPRAZolam (XANAX) 0.5 MG tablet TAKE 1 TABLET(0.5 MG) BY MOUTH DAILY AS NEEDED FOR ANXIETY  ? amLODipine (NORVASC) 10 MG tablet TAKE 1 TABLET(10 MG) BY MOUTH DAILY  ? atorvastatin (LIPITOR) 10 MG tablet Take 1 tablet (10 mg total) by mouth daily.  ? blood glucose meter kit and supplies KIT Dispense based on patient and insurance preference. Use up to four times daily as directed. (FOR ICD-9 250.00, 250.01).  ? Blood Glucose Monitoring Suppl (ONE TOUCH ULTRA 2) w/Device KIT Use to check sugar daily for type 2 diabetes E11.9  ? Continuous Blood Gluc Sensor (DEXCOM G6 SENSOR) MISC Place 1 Units onto the skin 4 (four) times daily.  ? Continuous Blood Gluc Transmit (DEXCOM G6 TRANSMITTER) MISC 1 Units by Does not apply route 4 (four) times daily. Use to check blood sugar four times daily. Replace after 90 days.  ? gabapentin (NEURONTIN) 600 MG tablet Take 600 mg by mouth 3 (three) times daily.  ?  glucose blood (ONETOUCH ULTRA) test strip USE TEST STRIPS TO TEST BLOOD SUGAR THREE TIMES DAILY FOR INSULIN DEPENDENT DIABEATES  ? insulin lispro (HUMALOG) 100 UNIT/ML KwikPen INJECT 8 UNITS UNDER THE SKIN THREE TIMES DAILY  ? Insulin Pen Needle (BD PEN NEEDLE NANO 2ND GEN) 32G X 4 MM MISC Inject 1 each into the skin 3 (three) times daily. as directed  ? Lancets (ONETOUCH DELICA PLUS [id number]) MISC CHECK FASTING  BLOOD SUGAR ONCE DAILY AS DIRECTED  ? metFORMIN (GLUCOPHAGE) 500 MG tablet Take 2 tablets (1,000 mg total) by mouth 2 (two) times daily with a meal. TAKE 2 TABLETS BY MOUTH TWICE DAILY WITH A MEAL  ? ondansetron (ZOFRAN-ODT) 4 MG disintegrating tablet DISSOLVE 1 TABLET(4 MG) ON THE TONGUE EVERY 8 HOURS AS NEEDED FOR NAUSEA OR VOMITING  ? oxyCODONE (OXY IR/ROXICODONE) 5 MG immediate release tablet Take 1.5 tablets (7.5 mg total) by mouth 3 (three) times daily.  ? Semaglutide, 1 MG/DOSE, 4 MG/3ML SOPN Inject 1 mg as directed once a week.  ? tiZANidine (ZANAFLEX) 4 MG tablet TAKE 1 TABLET BY MOUTH EVERY 4 TO 6 HOURS AS NEEDED  ? traMADol (ULTRAM) 50 MG tablet TAKE 1 TABLET(50 MG) BY MOUTH THREE TIMES DAILY AS NEEDED  ? ?No facility-administered medications prior to visit.  ? ? ?Review of Systems ? ? ?  Objective  ?  ?BP 134/87 (BP Location: Right Arm, Patient Position: Sitting, Cuff Size: Normal)   Pulse (!) 102   Temp 98 ?F (36.7 ?C) (Oral)   Resp 16   Wt 192 lb 3.2 oz (87.2 kg)   SpO2 98%   BMI 32.99 kg/m?  ? ? ?Physical Exam  ? ?General: Appearance:    Obese female in no acute distress  ?Eyes:    PERRL, conjunctiva/corneas clear, EOM's intact       ?Lungs:     Clear to auscultation bilaterally, respirations unlabored  ?Heart:    Tachycardic. Normal rhythm. No murmurs, rubs, or gallops.    ?MS:   All extremities are intact.    ?Neurologic:   Awake, alert, oriented x 3. No apparent focal neurological defect.   ?   ?  ? Assessment & Plan  ?  ? ?1. Type 2 diabetes mellitus with hyperlipidemia ([place]) ? ?- Urine Albumin-Creatinine with uACR ?- Hemoglobin A1c ? ?2. Colon cancer screening ? ?- Ambulatory referral to gastroenterology for colonoscopy ? ?3. Essential hypertension ?Near goal. Continue current medications.   ?- TSH ? ?4. Hypertriglyceridemia ?She is tolerating atorvastatin well with no adverse effects.   ?- CBC ?- Comprehensive metabolic panel ?- Lipid panel ?- Specimen status report ? ?5. Arthralgia of  both hands ? ?- Rheumatoid Factor ?- ANA Direct w/Reflex if Positive ? ?6. Hot flashes ? ?- FSH/LH ? ?7. Cervical cancer screening ? ?- Ambulatory referral to Obstetrics / Gynecology ? ?8. Need for hepatitis C screening test ? ?- Hepatitis C antibody ?   ? ?The entirety of the information documented in the History of Present Illness, Review of Systems and Physical Exam were personally obtained by me. Portions of this information were initially documented by the CMA and reviewed by me for thoroughness and accuracy.   ? ? ?Lelon Huh, MD  ?[facility] ?[phone] (phone) ?[phone] (fax) ? ?[facility] ?

## 2022-03-20 NOTE — Telephone Encounter (Signed)
Requested medication (s) are due for refill today: yes ? ?Requested medication (s) are on the active medication list: yes ? ?Last refill:  08/21/21 #30/5 ? ?Future visit scheduled: had appt today ? ?Notes to clinic:  Unable to refill per protocol, cannot delegate. ? ?  ?Requested Prescriptions  ?Pending Prescriptions Disp Refills  ? ALPRAZolam (XANAX) 0.5 MG tablet [Pharmacy Med Name: ALPRAZOLAM 0.5MG  TABLETS] 30 tablet   ?  Sig: TAKE 1 TABLET(0.5 MG) BY MOUTH DAILY AS NEEDED FOR ANXIETY  ?  ? Not Delegated - Psychiatry: Anxiolytics/Hypnotics 2 Failed - 03/20/2022  5:40 PM  ?  ?  Failed - This refill cannot be delegated  ?  ?  Failed - Urine Drug Screen completed in last 360 days  ?  ?  Passed - Patient is not pregnant  ?  ?  Passed - Valid encounter within last 6 months  ?  Recent Outpatient Visits   ? ?      ? Today Type 2 diabetes mellitus with hyperlipidemia (HCC)  ? [facility] Malva Limes, MD  ? 6 months ago Type 2 diabetes mellitus with hyperlipidemia (HCC)  ? [facility] Malva Limes, MD  ? 10 months ago Type 2 diabetes mellitus with hyperlipidemia (HCC)  ? [facility] Malva Limes, MD  ? 1 year ago Type 2 diabetes mellitus with hyperlipidemia (HCC)  ? [facility] Malva Limes, MD  ? 1 year ago Type 2 diabetes mellitus with hyperlipidemia (HCC)  ? [facility] Sherrie Mustache, Demetrios Isaacs, MD  ? ?  ?  ? ? ?  ?  ?  ? ?

## 2022-03-21 ENCOUNTER — Other Ambulatory Visit: Payer: Self-pay

## 2022-03-21 DIAGNOSIS — Z1211 Encounter for screening for malignant neoplasm of colon: Secondary | ICD-10-CM

## 2022-03-21 LAB — ANA W/REFLEX IF POSITIVE: Anti Nuclear Antibody (ANA): NEGATIVE

## 2022-03-21 LAB — LIPID PANEL
Chol/HDL Ratio: 5.7 ratio — ABNORMAL HIGH (ref 0.0–4.4)
Cholesterol, Total: 218 mg/dL — ABNORMAL HIGH (ref 100–199)
HDL: 38 mg/dL — ABNORMAL LOW (ref >39)
LDL Chol Calc (NIH): 102 mg/dL — ABNORMAL HIGH (ref 0–99)
Triglycerides: 464 mg/dL — ABNORMAL HIGH (ref 0–149)
VLDL Cholesterol Cal: 78 mg/dL — ABNORMAL HIGH (ref 5–40)

## 2022-03-21 LAB — COMPREHENSIVE METABOLIC PANEL
ALT: 27 IU/L (ref 0–32)
AST: 22 IU/L (ref 0–40)
Albumin/Globulin Ratio: 1.6 (ref 1.2–2.2)
Albumin: 4.5 g/dL (ref 3.8–4.8)
Alkaline Phosphatase: 134 IU/L — ABNORMAL HIGH (ref 44–121)
BUN/Creatinine Ratio: 22 (ref 9–23)
BUN: 12 mg/dL (ref 6–24)
Bilirubin Total: 0.3 mg/dL (ref 0.0–1.2)
CO2: 19 mmol/L — ABNORMAL LOW (ref 20–29)
Calcium: 10.1 mg/dL (ref 8.7–10.2)
Chloride: 100 mmol/L (ref 96–106)
Creatinine, Ser: 0.55 mg/dL — ABNORMAL LOW (ref 0.57–1.00)
Globulin, Total: 2.8 g/dL (ref 1.5–4.5)
Glucose: 193 mg/dL — ABNORMAL HIGH (ref 70–99)
Potassium: 4 mmol/L (ref 3.5–5.2)
Sodium: 138 mmol/L (ref 134–144)
Total Protein: 7.3 g/dL (ref 6.0–8.5)
eGFR: 113 mL/min/{1.73_m2} (ref >59)

## 2022-03-21 LAB — HEMOGLOBIN A1C
Est. average glucose Bld gHb Est-mCnc: 151 mg/dL
Hgb A1c MFr Bld: 6.9 % — ABNORMAL HIGH (ref 4.8–5.6)

## 2022-03-21 LAB — CBC
Hematocrit: 42.2 % (ref 34.0–46.6)
Hemoglobin: 14.3 g/dL (ref 11.1–15.9)
MCH: 29.1 pg (ref 26.6–33.0)
MCHC: 33.9 g/dL (ref 31.5–35.7)
MCV: 86 fL (ref 79–97)
Platelets: 372 10*3/uL (ref 150–450)
RBC: 4.92 x10E6/uL (ref 3.77–5.28)
RDW: 12.3 % (ref 11.7–15.4)
WBC: 6.7 10*3/uL (ref 3.4–10.8)

## 2022-03-21 LAB — HEPATITIS C ANTIBODY: Hep C Virus Ab: NONREACTIVE

## 2022-03-21 LAB — MICROALBUMIN / CREATININE URINE RATIO
Creatinine, Urine: 106.2 mg/dL
Microalb/Creat Ratio: 10 mg/g creat (ref 0–29)
Microalbumin, Urine: 10.2 ug/mL

## 2022-03-21 LAB — FSH/LH
FSH: 28.7 m[IU]/mL
LH: 24.7 m[IU]/mL

## 2022-03-21 LAB — RHEUMATOID FACTOR: Rheumatoid fact SerPl-aCnc: <10 IU/mL (ref <14.0)

## 2022-03-21 LAB — TSH: TSH: 1.12 u[IU]/mL (ref 0.450–4.500)

## 2022-03-21 LAB — SPECIMEN STATUS REPORT

## 2022-03-21 MED ORDER — NA SULFATE-K SULFATE-MG SULF 17.5-3.13-1.6 GM/177ML PO SOLN: 1.0000 | Freq: Once | ORAL | 0 refills | Status: AC

## 2022-03-21 NOTE — Progress Notes (Unsigned)
Gastroenterology Pre-Procedure Review ? ?Request Date: 03/26/22 ?Requesting Physician: Dr. Vicente Males ? ?PATIENT REVIEW QUESTIONS: The patient responded to the following health history questions as indicated:   ? ?1. Are you having any GI issues? no ?2. Do you have a personal history of Polyps? no colostomy reversal 2003 ?3. Do you have a family history of Colon Cancer or Polyps? no ?4. Diabetes Mellitus? yes (type 2) ?5. Joint replacements in the past 12 months?no ?6. Major health problems in the past 3 months?no ?7. Any artificial heart valves, MVP, or defibrillator?no ?   ?MEDICATIONS & ALLERGIES:    ?Patient reports the following regarding taking any anticoagulation/antiplatelet therapy:   ?Plavix, Coumadin, Eliquis, Xarelto, Lovenox, Pradaxa, Brilinta, or Effient? no ?Aspirin? no ? ?Patient confirms/reports the following medications:  ?Current Outpatient Medications  ?Medication Sig Dispense Refill  ? albuterol (VENTOLIN HFA) 108 (90 Base) MCG/ACT inhaler Inhale 2 puffs into the lungs every 6 (six) hours as needed for wheezing or shortness of breath. 25.5 g 1  ? ALPRAZolam (XANAX) 0.5 MG tablet TAKE 1 TABLET(0.5 MG) BY MOUTH DAILY AS NEEDED FOR ANXIETY 30 tablet 3  ? amLODipine (NORVASC) 10 MG tablet TAKE 1 TABLET(10 MG) BY MOUTH DAILY 90 tablet 4  ? atorvastatin (LIPITOR) 10 MG tablet Take 1 tablet (10 mg total) by mouth daily. 90 tablet 1  ? blood glucose meter kit and supplies KIT Dispense based on patient and insurance preference. Use up to four times daily as directed. (FOR ICD-9 250.00, 250.01). 1 each 0  ? Blood Glucose Monitoring Suppl (ONE TOUCH ULTRA 2) w/Device KIT Use to check sugar daily for type 2 diabetes E11.9 1 kit 0  ? Continuous Blood Gluc Sensor (DEXCOM G6 SENSOR) MISC Place 1 Units onto the skin 4 (four) times daily. 9 each 4  ? Continuous Blood Gluc Transmit (DEXCOM G6 TRANSMITTER) MISC 1 Units by Does not apply route 4 (four) times daily. Use to check blood sugar four times daily. Replace after  90 days. 1 each 4  ? gabapentin (NEURONTIN) 600 MG tablet Take 600 mg by mouth 3 (three) times daily.    ? glucose blood (ONETOUCH ULTRA) test strip USE TEST STRIPS TO TEST BLOOD SUGAR THREE TIMES DAILY FOR INSULIN DEPENDENT DIABEATES 100 strip 4  ? insulin lispro (HUMALOG) 100 UNIT/ML KwikPen INJECT 8 UNITS UNDER THE SKIN THREE TIMES DAILY 15 mL 4  ? Insulin Pen Needle (BD PEN NEEDLE NANO 2ND GEN) 32G X 4 MM MISC Inject 1 each into the skin 3 (three) times daily. as directed 100 each 3  ? Lancets (ONETOUCH DELICA PLUS [id number]) MISC CHECK FASTING BLOOD SUGAR ONCE DAILY AS DIRECTED 100 each 1  ? metFORMIN (GLUCOPHAGE) 500 MG tablet Take 2 tablets (1,000 mg total) by mouth 2 (two) times daily with a meal. TAKE 2 TABLETS BY MOUTH TWICE DAILY WITH A MEAL 360 tablet 1  ? mupirocin ointment (BACTROBAN) 2 % Apply 1 application. topically 2 (two) times daily. 30 g 2  ? ondansetron (ZOFRAN-ODT) 4 MG disintegrating tablet DISSOLVE 1 TABLET(4 MG) ON THE TONGUE EVERY 8 HOURS AS NEEDED FOR NAUSEA OR VOMITING 20 tablet 2  ? oxyCODONE (OXY IR/ROXICODONE) 5 MG immediate release tablet Take 1.5 tablets (7.5 mg total) by mouth 3 (three) times daily. 135 tablet 0  ? Semaglutide, 1 MG/DOSE, 4 MG/3ML SOPN Inject 1 mg as directed once a week. 3 mL 3  ? tiZANidine (ZANAFLEX) 4 MG tablet TAKE 1 TABLET BY MOUTH EVERY 4 TO 6 HOURS AS  NEEDED 120 tablet 1  ? traMADol (ULTRAM) 50 MG tablet TAKE 1 TABLET(50 MG) BY MOUTH THREE TIMES DAILY AS NEEDED 90 tablet 2  ? ?No current facility-administered medications for this visit.  ? ? ?Patient confirms/reports the following allergies:  ?Allergies  ?Allergen Reactions  ? Nalbuphine Other (See Comments)  ?  Causes hot flashes  ? ? ?No orders of the defined types were placed in this encounter. ? ? ?AUTHORIZATION INFORMATION ?Primary Insurance: ?1D#: ?Group #: ? ?Secondary Insurance: ?1D#: ?Group #: ? ?SCHEDULE INFORMATION: ?Date: 03/26/22 ?Time: ?Location: [facility] ?

## 2022-03-22 ENCOUNTER — Other Ambulatory Visit: Payer: Self-pay | Admitting: Family Medicine

## 2022-03-22 DIAGNOSIS — E1169 Type 2 diabetes mellitus with other specified complication: Secondary | ICD-10-CM

## 2022-03-25 ENCOUNTER — Other Ambulatory Visit: Payer: Self-pay | Admitting: Family Medicine

## 2022-03-25 ENCOUNTER — Telehealth: Payer: Self-pay

## 2022-03-25 ENCOUNTER — Encounter: Payer: Self-pay | Admitting: Gastroenterology

## 2022-03-25 DIAGNOSIS — G8921 Chronic pain due to trauma: Secondary | ICD-10-CM

## 2022-03-25 MED ORDER — OXYCODONE HCL 5 MG PO TABS: 7.5000 mg | ORAL_TABLET | Freq: Three times a day (TID) | ORAL | 0 refills | Status: DC

## 2022-03-25 NOTE — Telephone Encounter (Signed)
Alison Landry called stating that the patient called stating that she did not have the instructions of her colonoscopy. Therefore, today in the AM she was at [facility] ordering her breakfast. She was scheduled to have her colonoscopy done tomorrow but she needs to be rescheduled. I gave her a call and left her a message but I had to leave her a voicemail to call us back. ?

## 2022-03-25 NOTE — Telephone Encounter (Signed)
Returned phone call to patient.  She said she wanted to clarify that she did not eat anything today.  She has picked up her bowel prep.  Reviewed instructions with her to make sure she understood clear liquid diet and rx bowel prep.  She verbalized understanding and asked to remain on the schedule for tomorrow. ? ?I contacted Jacki Cones in Endo and asked her to keep patient on the schedule. ? ?Thanks, ?Marcelino Duster. CMA ?

## 2022-03-25 NOTE — Telephone Encounter (Signed)
Patient called stating that she did not eat [place] and that she was in the drive thru getting her kids breakfast. Patient does not want to reschedule her colonoscopy. Requesting a call back for verification that it has not been canceled.  ?

## 2022-03-26 ENCOUNTER — Ambulatory Visit: Payer: Medicare Other | Admitting: Registered Nurse

## 2022-03-26 ENCOUNTER — Ambulatory Visit
Admission: RE | Admit: 2022-03-26 | Discharge: 2022-03-26 | Disposition: A | Discharge status: Home / Self Care | Payer: Medicare Other | Source: Ambulatory Visit | Attending: Gastroenterology | Admitting: Gastroenterology

## 2022-03-26 ENCOUNTER — Encounter: Payer: Self-pay | Admitting: Gastroenterology

## 2022-03-26 ENCOUNTER — Other Ambulatory Visit: Payer: Self-pay

## 2022-03-26 ENCOUNTER — Encounter
Admission: RE | Disposition: A | Discharge status: Home / Self Care | Payer: Self-pay | Source: Ambulatory Visit | Attending: Gastroenterology

## 2022-03-26 DIAGNOSIS — F419 Anxiety disorder, unspecified: Secondary | ICD-10-CM | POA: Diagnosis not present

## 2022-03-26 DIAGNOSIS — K5730 Diverticulosis of large intestine without perforation or abscess without bleeding: Secondary | ICD-10-CM | POA: Diagnosis not present

## 2022-03-26 DIAGNOSIS — Z1211 Encounter for screening for malignant neoplasm of colon: Secondary | ICD-10-CM | POA: Diagnosis not present

## 2022-03-26 DIAGNOSIS — F32A Depression, unspecified: Secondary | ICD-10-CM | POA: Insufficient documentation

## 2022-03-26 DIAGNOSIS — K635 Polyp of colon: Secondary | ICD-10-CM

## 2022-03-26 DIAGNOSIS — E119 Type 2 diabetes mellitus without complications: Secondary | ICD-10-CM | POA: Insufficient documentation

## 2022-03-26 HISTORY — PX: COLONOSCOPY WITH PROPOFOL: SHX5780

## 2022-03-26 LAB — GLUCOSE, CAPILLARY: Glucose-Capillary: 176 mg/dL — ABNORMAL HIGH (ref 70–99)

## 2022-03-26 SURGERY — COLONOSCOPY WITH PROPOFOL
Anesthesia: General

## 2022-03-26 MED ORDER — DEXMEDETOMIDINE HCL 200 MCG/2ML IV SOLN
INTRAVENOUS | Status: DC | PRN
  Administered 2022-03-26: 20 ug via INTRAVENOUS

## 2022-03-26 MED ORDER — SODIUM CHLORIDE 0.9 % IV SOLN: INTRAVENOUS | Status: DC

## 2022-03-26 MED ORDER — PROPOFOL 500 MG/50ML IV EMUL
INTRAVENOUS | Status: DC | PRN
  Administered 2022-03-26: 150 ug/kg/min via INTRAVENOUS

## 2022-03-26 MED ORDER — LIDOCAINE HCL (CARDIAC) PF 100 MG/5ML IV SOSY
PREFILLED_SYRINGE | INTRAVENOUS | Status: DC | PRN
Start: 2022-03-26 — End: 2022-03-26
  Administered 2022-03-26: 100 mg via INTRAVENOUS

## 2022-03-26 MED ORDER — PHENYLEPHRINE HCL (PRESSORS) 10 MG/ML IV SOLN
INTRAVENOUS | Status: DC | PRN
Start: 2022-03-26 — End: 2022-03-26
  Administered 2022-03-26: 240 ug via INTRAVENOUS

## 2022-03-26 MED ORDER — PROPOFOL 10 MG/ML IV BOLUS
INTRAVENOUS | Status: DC | PRN
  Administered 2022-03-26: 90 mg via INTRAVENOUS
  Administered 2022-03-26: 30 mg via INTRAVENOUS
  Administered 2022-03-26: 10 mg via INTRAVENOUS

## 2022-03-26 NOTE — Transfer of Care (Signed)
Immediate Anesthesia Transfer of Care Note ? ?Patient: Alison Landry ? ?Procedure(s) Performed: COLONOSCOPY WITH PROPOFOL ? ?Patient Location: Endoscopy Unit ? ?Anesthesia Type:General ? ?Level of Consciousness: drowsy ? ?Airway & Oxygen Therapy: Patient Spontanous Breathing ? ?Post-op Assessment: Report given to RN and Post -op Vital signs reviewed and stable ? ?Post vital signs: Reviewed and stable ? ?Last Vitals:  ?Vitals Value Taken Time  ?BP 83/64 03/26/22 0840  ?Temp    ?Pulse 88 03/26/22 0840  ?Resp 17 03/26/22 0840  ?SpO2 98 % 03/26/22 0840  ?Vitals shown include unvalidated device data. ? ?Last Pain:  ?Vitals:  ? 03/26/22 0840  ?TempSrc:   ?PainSc: 0-No pain  ?   ? ?  ? ?Complications: No notable events documented. ?
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
## 2022-03-26 NOTE — Anesthesia Postprocedure Evaluation (Signed)
Anesthesia Post Note ? ?Patient: [name] ? ?Procedure(s) Performed: COLONOSCOPY WITH PROPOFOL ? ?Patient location during evaluation: Endoscopy ?Anesthesia Type: General ?Level of consciousness: awake and alert ?Pain management: pain level controlled ?Vital Signs Assessment: post-procedure vital signs reviewed and stable ?Respiratory status: spontaneous breathing, nonlabored ventilation, respiratory function stable and patient connected to nasal cannula oxygen ?Cardiovascular status: blood pressure returned to baseline and stable ?Postop Assessment: no apparent nausea or vomiting ?Anesthetic complications: no ? ? ?No notable events documented. ? ? ?Last Vitals:  ?Vitals:  ? 03/26/22 0900 03/26/22 0910  ?BP: 110/77 95/75  ?Pulse: 77 76  ?Resp: 15 16  ?Temp:    ?SpO2: 97% 97%  ?  ?Last Pain:  ?Vitals:  ? 03/26/22 0910  ?TempSrc:   ?PainSc: 0-No pain  ? ? ?  ?  ?  ?  ?  ?  ? ?Alison Landry ? ? ? ? ?

## 2022-03-26 NOTE — Op Note (Addendum)
[facility] ?Gastroenterology ?Patient Name: Alison Landry ?Procedure Date: 03/26/2022 8:19 AM ?MRN: [id number] ?Account #: [id number] ?Date of Birth: Apr 11, 1971 ?Admit Type: Outpatient ?Age: 51 ?Room: [facility]OM 2 ?Gender: Female ?Note Status: Finalized ?Instrument Name: Colonoscope [id number] ?Procedure:             Colonoscopy ?Indications:           Screening for colorectal malignant neoplasm ?Providers:             Jonathon Bellows MD, MD ?Referring MD:          Kirstie Peri. Caryn Section, MD (Referring MD) ?Medicines:             Monitored Anesthesia Care ?Complications:         No immediate complications. ?Procedure:             Pre-Anesthesia Assessment: ?                       - Prior to the procedure, a History and Physical was  ?                       performed, and patient medications, allergies and  ?                       sensitivities were reviewed. The patient's tolerance  ?                       of previous anesthesia was reviewed. ?                       - The risks and benefits of the procedure and the  ?                       sedation options and risks were discussed with the  ?                       patient. All questions were answered and informed  ?                       consent was obtained. ?                       - ASA Grade Assessment: II - A patient with mild  ?                       systemic disease. ?                       After obtaining informed consent, the colonoscope was  ?                       passed under direct vision. Throughout the procedure,  ?                       the patient's blood pressure, pulse, and oxygen  ?                       saturations were monitored continuously. The  ?                       Colonoscope was  introduced through the anus and  ?                       advanced to the the cecum, identified by the  ?                       appendiceal orifice. The colonoscopy was performed  ?                       with ease. The patient tolerated the procedure well.   ?                       The quality of the bowel preparation was excellent. ?Findings: ?     The perianal and digital rectal examinations were normal. ?     Multiple small-mouthed diverticula were found in the sigmoid colon. ?     The exam was otherwise without abnormality on direct and retroflexion  ?     views. ?     A 3 mm polyp was found in the transverse colon. The polyp was sessile.  ?     The polyp was removed with a jumbo cold forceps. Resection and retrieval  ?     were complete. ?Impression:            - Diverticulosis in the sigmoid colon. ?                       - The examination was otherwise normal on direct and  ?                       retroflexion views. ?Recommendation:        - Discharge patient to home (with escort). ?                       - Resume previous diet. ?                       - Continue present medications. ?                       - Await pathology results. ?                       - Repeat colonoscopy for surveillance based on  ?                       pathology results. ?Procedure Code(s):     --- Professional --- ?                       [phone], Colonoscopy, flexible; with biopsy, single or  ?                       multiple ?Diagnosis Code(s):     --- Professional --- ?                       Z12.11, Encounter for screening for malignant neoplasm  ?                       of colon ?  K57.30, Diverticulosis of large intestine without  ?                       perforation or abscess without bleeding ?CPT copyright 2019 American Medical Association. All rights reserved. ?The codes documented in this report are preliminary and upon coder review may  ?be revised to meet current compliance requirements. ?Jonathon Bellows, MD ?Jonathon Bellows MD, MD ?03/26/2022 8:36:51 AM ?This report has been signed electronically. ?Number of Addenda: 0 ?Note Initiated On: 03/26/2022 8:19 AM ?Scope Withdrawal Time: 0 hours 9 minutes 15 seconds  ?Total Procedure Duration: 0 hours 12 minutes 21 seconds   ?Estimated Blood Loss:  Estimated blood loss: none. ?     [facility] ?

## 2022-03-27 ENCOUNTER — Encounter: Payer: Self-pay | Admitting: Gastroenterology

## 2022-03-27 LAB — SURGICAL PATHOLOGY

## 2022-03-28 ENCOUNTER — Encounter: Payer: Self-pay | Admitting: Gastroenterology

## 2022-03-28 ENCOUNTER — Other Ambulatory Visit: Payer: Self-pay | Admitting: Family Medicine

## 2022-03-28 DIAGNOSIS — E781 Pure hyperglyceridemia: Secondary | ICD-10-CM

## 2022-03-28 MED ORDER — ATORVASTATIN CALCIUM 10 MG PO TABS: 10.0000 mg | ORAL_TABLET | Freq: Every day | ORAL | 4 refills | Status: DC

## 2022-04-04 ENCOUNTER — Encounter: Payer: Medicare Other | Admitting: Advanced Practice Midwife

## 2022-04-05 ENCOUNTER — Encounter: Payer: Medicare Other | Admitting: Advanced Practice Midwife

## 2022-04-18 ENCOUNTER — Other Ambulatory Visit: Payer: Self-pay | Admitting: Family Medicine

## 2022-04-18 DIAGNOSIS — G8921 Chronic pain due to trauma: Secondary | ICD-10-CM

## 2022-04-18 NOTE — Telephone Encounter (Signed)
Requested medication (s) are due for refill today: Due 04/24/22  Requested medication (s) are on the active medication list: yes    Last refill: 03/25/22  #135  0 refills  Future visit scheduled    with nurse 01/30/23  Notes to clinic: Not delegated. Please review.  Requested Prescriptions  Pending Prescriptions Disp Refills   oxyCODONE (OXY IR/ROXICODONE) 5 MG immediate release tablet 135 tablet 0    Sig: Take 1.5 tablets (7.5 mg total) by mouth 3 (three) times daily.     Not Delegated - Analgesics:  Opioid Agonists Failed - 04/18/2022  3:36 PM      Failed - This refill cannot be delegated      Failed - Urine Drug Screen completed in last 360 days      Passed - Valid encounter within last 3 months    Recent Outpatient Visits           4 weeks ago Type 2 diabetes mellitus with hyperlipidemia [facility])   [facility] Malva Limes, MD   7 months ago Type 2 diabetes mellitus with hyperlipidemia [facility])   [facility] Malva Limes, MD   11 months ago Type 2 diabetes mellitus with hyperlipidemia [facility])   [facility] Malva Limes, MD   1 year ago Type 2 diabetes mellitus with hyperlipidemia [facility])   [facility] Malva Limes, MD   1 year ago Type 2 diabetes mellitus with hyperlipidemia [facility])   [facility] Sherrie Mustache, Demetrios Isaacs, MD

## 2022-04-18 NOTE — Telephone Encounter (Signed)
Copied from CRM [id number]. Topic: General - Other >> Apr 18, 2022  2:27 PM Gaetana Michaelis A wrote: Reason for CRM: Medication Refill - Medication: oxyCODONE (OXY IR/ROXICODONE) 5 MG immediate release tablet [[id number]]   Has the patient contacted their pharmacy? No. (Agent: If no, request that the patient contact the pharmacy for the refill. If patient does not wish to contact the pharmacy document the reason why and proceed with request.) (Agent: If yes, when and what did the pharmacy advise?)  Preferred Pharmacy (with phone number or street name): [facility][id number] [place], [place] - [address] AT [facility] OF [address] [place] [address] Phone: [phone] Fax: [phone] Hours: Not open 24 hours   Has the patient been seen for an appointment in the last year OR does the patient have an upcoming appointment? Yes.    Agent: Please be advised that RX refills may take up to 3 business days. We ask that you follow-up with your pharmacy.

## 2022-04-19 ENCOUNTER — Other Ambulatory Visit: Payer: Self-pay | Admitting: Family Medicine

## 2022-04-19 DIAGNOSIS — G8921 Chronic pain due to trauma: Secondary | ICD-10-CM

## 2022-04-22 ENCOUNTER — Other Ambulatory Visit: Payer: Self-pay | Admitting: Family Medicine

## 2022-04-22 ENCOUNTER — Other Ambulatory Visit: Payer: Self-pay

## 2022-04-22 DIAGNOSIS — M25541 Pain in joints of right hand: Secondary | ICD-10-CM

## 2022-04-22 DIAGNOSIS — E119 Type 2 diabetes mellitus without complications: Secondary | ICD-10-CM

## 2022-04-22 MED ORDER — GABAPENTIN 600 MG PO TABS
600.0000 mg | ORAL_TABLET | Freq: Three times a day (TID) | ORAL | 0 refills | Status: DC
  Filled 2022-04-22: qty 90, 30d supply, fill #0

## 2022-04-22 MED ORDER — GABAPENTIN 600 MG PO TABS: 600.0000 mg | ORAL_TABLET | Freq: Three times a day (TID) | ORAL | 0 refills | Status: DC

## 2022-04-22 MED ORDER — OXYCODONE HCL 5 MG PO TABS: 7.5000 mg | ORAL_TABLET | Freq: Three times a day (TID) | ORAL | 0 refills | Status: DC

## 2022-04-22 NOTE — Telephone Encounter (Signed)
Requested medication (s) are due for refill today: Yes  Requested medication (s) are on the active medication list: Yes  Last refill:  03/25/22  Future visit scheduled: No  Notes to clinic:  See request.    Requested Prescriptions  Pending Prescriptions Disp Refills   oxyCODONE (OXY IR/ROXICODONE) 5 MG immediate release tablet 135 tablet 0    Sig: Take 1.5 tablets (7.5 mg total) by mouth 3 (three) times daily.     Not Delegated - Analgesics:  Opioid Agonists Failed - 04/22/2022  2:43 PM      Failed - This refill cannot be delegated      Failed - Urine Drug Screen completed in last 360 days      Passed - Valid encounter within last 3 months    Recent Outpatient Visits           1 month ago Type 2 diabetes mellitus with hyperlipidemia [facility])   [facility] Malva Limes, MD   7 months ago Type 2 diabetes mellitus with hyperlipidemia [facility])   [facility] Malva Limes, MD   11 months ago Type 2 diabetes mellitus with hyperlipidemia [facility])   [facility] Malva Limes, MD   1 year ago Type 2 diabetes mellitus with hyperlipidemia [facility])   [facility] Malva Limes, MD   1 year ago Type 2 diabetes mellitus with hyperlipidemia [facility])   [facility] Sherrie Mustache, Demetrios Isaacs, MD

## 2022-04-22 NOTE — Telephone Encounter (Signed)
Pt called in checking status of oxycodone med. I gave her the 48-72 hour turn around time.

## 2022-04-22 NOTE — Telephone Encounter (Signed)
Resending Rx as ordered previously.  Requested Prescriptions  Pending Prescriptions Disp Refills  . gabapentin (NEURONTIN) 600 MG tablet 90 tablet 0    Sig: Take 1 tablet (600 mg total) by mouth 3 (three) times daily.     Neurology: Anticonvulsants - gabapentin Failed - 04/22/2022  4:10 PM      Failed - Cr in normal range and within 360 days    Creatinine, Ser  Date Value Ref Range Status  03/20/2022 0.55 (L) 0.57 - 1.00 mg/dL Final   Creatinine, POC  Date Value Ref Range Status  11/21/2017 NA mg/dL Final         Passed - Completed PHQ-2 or PHQ-9 in the last 360 days      Passed - Valid encounter within last 12 months    Recent Outpatient Visits          1 month ago Type 2 diabetes mellitus with hyperlipidemia [facility])   [facility] Birdie Sons, MD   7 months ago Type 2 diabetes mellitus with hyperlipidemia [facility])   [facility] Birdie Sons, MD   11 months ago Type 2 diabetes mellitus with hyperlipidemia [facility])   [facility] Birdie Sons, MD   1 year ago Type 2 diabetes mellitus with hyperlipidemia [facility])   [facility] Birdie Sons, MD   1 year ago Type 2 diabetes mellitus with hyperlipidemia [facility])   [facility] Birdie Sons, MD             Signed Prescriptions Disp Refills   gabapentin (NEURONTIN) 600 MG tablet 90 tablet 0    Sig: Take 1 tablet (600 mg total) by mouth 3 (three) times daily.     Neurology: Anticonvulsants - gabapentin Failed - 04/22/2022 10:51 AM      Failed - Cr in normal range and within 360 days    Creatinine, Ser  Date Value Ref Range Status  03/20/2022 0.55 (L) 0.57 - 1.00 mg/dL Final   Creatinine, POC  Date Value Ref Range Status  11/21/2017 NA mg/dL Final         Passed - Completed PHQ-2 or PHQ-9 in the last 360 days      Passed - Valid encounter within last 12 months    Recent Outpatient Visits          1 month ago Type 2 diabetes mellitus with  hyperlipidemia [facility])   [facility] Birdie Sons, MD   7 months ago Type 2 diabetes mellitus with hyperlipidemia [facility])   [facility] Birdie Sons, MD   11 months ago Type 2 diabetes mellitus with hyperlipidemia [facility])   [facility] Birdie Sons, MD   1 year ago Type 2 diabetes mellitus with hyperlipidemia [facility])   [facility] Birdie Sons, MD   1 year ago Type 2 diabetes mellitus with hyperlipidemia [facility])   [facility] Caryn Section, Kirstie Peri, MD

## 2022-04-22 NOTE — Telephone Encounter (Signed)
Pt called in and states gabapetin was supposed to be sent to [facility], not [facility] employee as shown [facility] [place], [place] Phone:  [phone]  Fax:  [phone]

## 2022-04-22 NOTE — Telephone Encounter (Signed)
Pt waiting on status of refill of Oxycodone. Please call back

## 2022-04-23 ENCOUNTER — Other Ambulatory Visit: Payer: Self-pay | Admitting: Family Medicine

## 2022-04-23 DIAGNOSIS — E1169 Type 2 diabetes mellitus with other specified complication: Secondary | ICD-10-CM

## 2022-04-23 NOTE — Telephone Encounter (Signed)
[facility] #[id number] [place] Rough, [place] - [address] AT [facility] [address]  [address] [place], [place] [address]  Phone:  [phone]  Fax:  [phone]  Didn't have the sensor and transmit hen pt was prescribed them so she never picked them up but now the pharmacy has them available and pt needs a new RX to be sent to get them / please advise   Continuous Blood Gluc Sensor (DEXCOM G6 SENSOR) MISC and   Continuous Blood Gluc Transmit (DEXCOM G6 TRANSMITTER) MISC

## 2022-04-24 NOTE — Telephone Encounter (Signed)
Requested medication (s) are due for refill today: For review  Requested medication (s) are on the active medication list: yes    Last refill: 02/06/21  Future visit scheduled With nurse  01/30/23  Notes to clinic:     Pt states did not have the sensor and transmitter when pt was prescribed them so she never picked them up but now the pharmacy has them available and pt needs a new RX to be sent to get them / please advise    Continuous Blood Gluc Sensor (DEXCOM G6 SENSOR) MISC and    Continuous Blood Gluc Transmit (DEXCOM G6 TRANSMITTER) MISC Requested Prescriptions  Pending Prescriptions Disp Refills   Continuous Blood Gluc Sensor (DEXCOM G6 SENSOR) MISC 9 each 4    Sig: Place 1 Units onto the skin 4 (four) times daily.     Endocrinology: Diabetes - Testing Supplies Passed - 04/23/2022  8:58 AM      Passed - Valid encounter within last 12 months    Recent Outpatient Visits           1 month ago Type 2 diabetes mellitus with hyperlipidemia [facility])   [facility] Mal[facility] Limes, MD   7 months ago Type 2 diabetes mellitus with hyperlipidemia [facility])   [facility] Malva Limes, MD   11 months ago Type 2 diabetes mellitus with hyperlipidemia [facility])   [facility] Malva Limes, MD   1 year ago Type 2 diabetes mellitus with hyperlipidemia [facility])   [facility] Malva Limes, MD   1 year ago Type 2 diabetes mellitus with hyperlipidemia [facility])   [facility] Malva Limes, MD                Continuous Blood Gluc Transmit (DEXCOM G6 TRANSMITTER) MISC 1 each 4    Sig: 1 Units by Does not apply route 4 (four) times daily. Use to check blood sugar four times daily. Replace after 90 days.     Endocrinology: Diabetes - Testing Supplies Passed - 04/23/2022  8:58 AM      Passed - Valid encounter within last 12 months    Recent Outpatient Visits           1 month ago Type 2 diabetes mellitus with  hyperlipidemia [facility])   [facility] Malva Limes, MD   7 months ago Type 2 diabetes mellitus with hyperlipidemia [facility])   [facility] Malva Limes, MD   11 months ago Type 2 diabetes mellitus with hyperlipidemia [facility])   [facility] Malva Limes, MD   1 year ago Type 2 diabetes mellitus with hyperlipidemia [facility])   [facility] Malva Limes, MD   1 year ago Type 2 diabetes mellitus with hyperlipidemia [facility])   [facility] Malva Limes, MD

## 2022-04-25 MED ORDER — DEXCOM G6 SENSOR MISC: 1.0000 [IU] | Freq: Four times a day (QID) | 4 refills | Status: DC

## 2022-04-25 MED ORDER — DEXCOM G6 TRANSMITTER MISC: 1.0000 [IU] | Freq: Four times a day (QID) | 4 refills | Status: DC

## 2022-05-06 ENCOUNTER — Encounter: Payer: Medicare Other | Admitting: Licensed Practical Nurse

## 2022-05-06 ENCOUNTER — Encounter: Payer: Medicare Other | Admitting: Advanced Practice Midwife

## 2022-05-21 ENCOUNTER — Other Ambulatory Visit: Payer: Self-pay | Admitting: Family Medicine

## 2022-05-21 DIAGNOSIS — G8921 Chronic pain due to trauma: Secondary | ICD-10-CM

## 2022-05-21 MED ORDER — OXYCODONE HCL 5 MG PO TABS: 7.5000 mg | ORAL_TABLET | Freq: Three times a day (TID) | ORAL | 0 refills | Status: DC

## 2022-05-21 NOTE — Telephone Encounter (Signed)
Medication Refill - Medication: oxyCODONE (OXY IR/ROXICODONE) 5 MG immediate release tablet  Has the patient contacted their pharmacy? Yes.   Pt previously told to contact provider   Preferred Pharmacy (with phone number or street name):  [facility] #[id number] [place]oles Rough, [place] - [address] AT [facility] [address] Phone:  [phone]  Fax:  [phone]     Has the patient been seen for an appointment in the last year OR does the patient have an upcoming appointment? Yes.    Agent: Please be advised that RX refills may take up to 3 business days. We ask that you follow-up with your pharmacy.

## 2022-05-21 NOTE — Telephone Encounter (Signed)
Requested medication (s) are due for refill today - yes  Requested medication (s) are on the active medication list -yes  Future visit scheduled -no  Last refill: 04/22/22 #135  Notes to clinic: non delegated Rx  Requested Prescriptions  Pending Prescriptions Disp Refills   oxyCODONE (OXY IR/ROXICODONE) 5 MG immediate release tablet 135 tablet 0    Sig: Take 1.5 tablets (7.5 mg total) by mouth 3 (three) times daily.     Not Delegated - Analgesics:  Opioid Agonists Failed - 05/21/2022  9:31 AM      Failed - This refill cannot be delegated      Failed - Urine Drug Screen completed in last 360 days      Passed - Valid encounter within last 3 months    Recent Outpatient Visits           2 months ago Type 2 diabetes mellitus with hyperlipidemia [facility])   [facility] Malva Limes, MD   8 months ago Type 2 diabetes mellitus with hyperlipidemia [facility])   [facility] Malva L[facility], MD   1 year ago Type 2 diabetes mellitus with hyperlipidemia [facility])   [facility] Malva Limes, MD   1 year ago Type 2 diabetes mellitus with hyperlipidemia [facility])   [facility] Malva L[facility], MD   1 year ago Type 2 diabetes mellitus with hyperlipidemia [facility])   [facility] Malva Limes, MD                 Requested Prescriptions  Pending Prescriptions Disp Refills   oxyCODONE (OXY IR/ROXICODONE) 5 MG immediate release tablet 135 tablet 0    Sig: Take 1.5 tablets (7.5 mg total) by mouth 3 (three) times daily.     Not Delegated - Analgesics:  Opioid Agonists Failed - 05/21/2022  9:31 AM      Failed - This refill cannot be delegated      Failed - Urine Drug Screen completed in last 360 days      Passed - Valid encounter within last 3 months    Recent Outpatient Visits           2 months ago Type 2 diabetes mellitus with hyperlipidemia [facility])   [facility] Malva Limes, MD   8 months ago  Type 2 diabetes mellitus with hyperlipidemia [facility])   [facility] Malva L[facility], MD   1 year ago Type 2 diabetes mellitus with hyperlipidemia [facility])   [facility] Malva Limes, MD   1 year ago Type 2 diabetes mellitus with hyperlipidemia [facility])   [facility] Malva Limes, MD   1 year ago Type 2 diabetes mellitus with hyperlipidemia [facility])   [facility] Sherrie Mustache, Demetrios Isaacs, MD

## 2022-05-28 ENCOUNTER — Encounter: Payer: Self-pay | Admitting: Licensed Practical Nurse

## 2022-05-28 ENCOUNTER — Other Ambulatory Visit (HOSPITAL_COMMUNITY)
Admission: RE | Admit: 2022-05-28 | Discharge: 2022-05-28 | Disposition: A | Discharge status: Home / Self Care | Payer: Medicare Other | Source: Ambulatory Visit | Attending: Advanced Practice Midwife | Admitting: Advanced Practice Midwife

## 2022-05-28 ENCOUNTER — Ambulatory Visit: Payer: Medicare Other | Admitting: Licensed Practical Nurse

## 2022-05-28 VITALS — BP 122/72 | Ht 64.0 in | Wt 194.0 lb

## 2022-05-28 DIAGNOSIS — Z01419 Encounter for gynecological examination (general) (routine) without abnormal findings: Secondary | ICD-10-CM | POA: Insufficient documentation

## 2022-05-28 DIAGNOSIS — N898 Other specified noninflammatory disorders of vagina: Secondary | ICD-10-CM | POA: Diagnosis not present

## 2022-05-28 DIAGNOSIS — Z113 Encounter for screening for infections with a predominantly sexual mode of transmission: Secondary | ICD-10-CM | POA: Diagnosis present

## 2022-05-28 DIAGNOSIS — B3731 Acute candidiasis of vulva and vagina: Secondary | ICD-10-CM | POA: Diagnosis not present

## 2022-05-28 DIAGNOSIS — Z124 Encounter for screening for malignant neoplasm of cervix: Secondary | ICD-10-CM | POA: Insufficient documentation

## 2022-05-28 DIAGNOSIS — Z1151 Encounter for screening for human papillomavirus (HPV): Secondary | ICD-10-CM | POA: Insufficient documentation

## 2022-05-28 MED ORDER — FLUCONAZOLE 150 MG PO TABS: 150.0000 mg | ORAL_TABLET | ORAL | 0 refills | Status: AC
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
## 2022-05-30 LAB — CERVICOVAGINAL ANCILLARY ONLY
Bacterial Vaginitis (gardnerella): POSITIVE — AB
Candida Glabrata: NEGATIVE
Candida Vaginitis: POSITIVE — AB
Comment: NEGATIVE
Comment: NEGATIVE
Comment: NEGATIVE

## 2022-05-31 LAB — CYTOLOGY - PAP
Chlamydia: NEGATIVE
Comment: NEGATIVE
Comment: NEGATIVE
Comment: NEGATIVE
Comment: NEGATIVE
Comment: NEGATIVE
Comment: NORMAL
Diagnosis: UNDETERMINED — AB
HPV 16: NEGATIVE
HPV 18 / 45: POSITIVE — AB
High risk HPV: POSITIVE — AB
Neisseria Gonorrhea: NEGATIVE
Trichomonas: NEGATIVE

## 2022-06-10 ENCOUNTER — Other Ambulatory Visit: Payer: Self-pay | Admitting: Licensed Practical Nurse

## 2022-06-10 DIAGNOSIS — N760 Acute vaginitis: Secondary | ICD-10-CM

## 2022-06-10 MED ORDER — METRONIDAZOLE 0.75 % VA GEL: 1.0000 | Freq: Every day | VAGINAL | 1 refills | Status: DC

## 2022-06-10 NOTE — Progress Notes (Signed)
TC to [facility],  Reviewed pap results, rec colpo, front desk to call and schedule colpo Aptima swab positive for yeast and BV, was treated for yeast on day of apt. , script for metro gel sent Reviewed notes from [facility] regarding uterine embolization from 2021,  pt wonders if she needs nay follow up, she is not experiencing any heavy bleeding, reviewed that she does not need follow up unless she stats to experiences symptoms to AUB. Carie Caddy, CNM  [facility], [facility]  06/10/22  11:12 AM

## 2022-06-20 ENCOUNTER — Other Ambulatory Visit: Payer: Self-pay | Admitting: Family Medicine

## 2022-06-20 DIAGNOSIS — G8921 Chronic pain due to trauma: Secondary | ICD-10-CM

## 2022-06-20 NOTE — Telephone Encounter (Signed)
Medication Refill - Medication:  Oxycodone 5mg   Has the patient contacted their pharmacy? No. (Agent: If no, request that the patient contact the pharmacy for the refill. If patient does not wish to contact the pharmacy document the reason why and proceed with request.) (Agent: If yes, when and what did the pharmacy advise?)  Preferred Pharmacy (with phone number or street name): [facility]    Has the patient been seen for an appointment in the last year OR does the patient have an upcoming appointment? Yes.    Agent: Please be advised that RX refills may take up to 3 business days. We ask that you follow-up with your pharmacy.

## 2022-06-21 ENCOUNTER — Ambulatory Visit
Admission: RE | Admit: 2022-06-21 | Discharge: 2022-06-21 | Disposition: A | Discharge status: Home / Self Care | Payer: Medicare Other | Source: Ambulatory Visit | Attending: Family Medicine | Admitting: Family Medicine

## 2022-06-21 DIAGNOSIS — Z1231 Encounter for screening mammogram for malignant neoplasm of breast: Secondary | ICD-10-CM | POA: Diagnosis not present

## 2022-06-21 MED ORDER — OXYCODONE HCL 5 MG PO TABS: 7.5000 mg | ORAL_TABLET | Freq: Three times a day (TID) | ORAL | 0 refills | Status: DC

## 2022-06-21 NOTE — Telephone Encounter (Signed)
Requested medication (s) are due for refill today - yes  Requested medication (s) are on the active medication list -yes  Future visit scheduled -no  Last refill: 05/21/22 #135  Notes to clinic: non delegated Rx  Requested Prescriptions  Pending Prescriptions Disp Refills   oxyCODONE (OXY IR/ROXICODONE) 5 MG immediate release tablet 135 tablet 0    Sig: Take 1.5 tablets (7.5 mg total) by mouth 3 (three) times daily.     Not Delegated - Analgesics:  Opioid Agonists Failed - 06/20/2022  1:52 PM      Failed - This refill cannot be delegated      Failed - Urine Drug Screen completed in last 360 days      Failed - Valid encounter within last 3 months    Recent Outpatient Visits           3 months ago Type 2 diabetes mellitus with hyperlipidemia [facility])   [facility] Malva Limes, MD   9 months ago Type 2 diabetes mellitus with hyperlipidemia [facility])   [facility] Malva Limes, MD   1 year ago Type 2 diabetes mellitus with hyperlipidemia [facility])   [facility] Malva Limes, MD   1 year ago Type 2 diabetes mellitus with hyperlipidemia [facility])   [facility] Malva Limes, MD   1 year ago Type 2 diabetes mellitus with hyperlipidemia [facility]   [facility] Malva Limes, MD                 Requested Prescriptions  Pending Prescriptions Disp Refills   oxyCODONE (OXY IR/ROXICODONE) 5 MG immediate release tablet 135 tablet 0    Sig: Take 1.5 tablets (7.5 mg total) by mouth 3 (three) times daily.     Not Delegated - Analgesics:  Opioid Agonists Failed - 06/20/2022  1:52 PM      Failed - This refill cannot be delegated      Failed - Urine Drug Screen completed in last 360 days      Failed - Valid encounter within last 3 months    Recent Outpatient Visits           3 months ago Type 2 diabetes mellitus with hyperlipidemia [facility])   [facility] Malva Limes, MD   9 months ago  Type 2 diabetes mellitus with hyperlipidemia [facility])   [facility] Malva Limes, MD   1 year ago Type 2 diabetes mellitus with hyperlipidemia [facility])   [facility] Malva Limes, MD   1 year ago Type 2 diabetes mellitus with hyperlipidemia [facility])   [facility] Malva Limes, MD   1 year ago Type 2 diabetes mellitus with hyperlipidemia [facility])   [facility] Sherrie Mustache, Demetrios Isaacs, MD

## 2022-06-24 ENCOUNTER — Other Ambulatory Visit: Payer: Self-pay | Admitting: Family Medicine

## 2022-07-09 ENCOUNTER — Encounter: Payer: Self-pay | Admitting: Obstetrics & Gynecology

## 2022-07-09 ENCOUNTER — Telehealth: Payer: Self-pay | Admitting: Family Medicine

## 2022-07-09 ENCOUNTER — Other Ambulatory Visit (HOSPITAL_COMMUNITY)
Admission: RE | Admit: 2022-07-09 | Discharge: 2022-07-09 | Disposition: A | Discharge status: Home / Self Care | Payer: Medicare Other | Source: Ambulatory Visit | Attending: Obstetrics & Gynecology | Admitting: Obstetrics & Gynecology

## 2022-07-09 ENCOUNTER — Ambulatory Visit (INDEPENDENT_AMBULATORY_CARE_PROVIDER_SITE_OTHER): Payer: Medicare Other | Admitting: Obstetrics & Gynecology

## 2022-07-09 VITALS — BP 120/80 | Ht 64.0 in | Wt 191.0 lb

## 2022-07-09 DIAGNOSIS — R87810 Cervical high risk human papillomavirus (HPV) DNA test positive: Secondary | ICD-10-CM

## 2022-07-09 DIAGNOSIS — B3731 Acute candidiasis of vulva and vagina: Secondary | ICD-10-CM | POA: Diagnosis not present

## 2022-07-09 DIAGNOSIS — R87610 Atypical squamous cells of undetermined significance on cytologic smear of cervix (ASC-US): Secondary | ICD-10-CM

## 2022-07-09 IMAGING — MG DIGITAL SCREENING BILAT W/ TOMO W/ CAD
6 of 10 series · 6 of 30 positions shown · non-contrast
Comparison: None.

CLINICAL DATA: Screening.

EXAM:
DIGITAL SCREENING BILATERAL MAMMOGRAM WITH TOMO AND CAD

[L CC synth-2D]
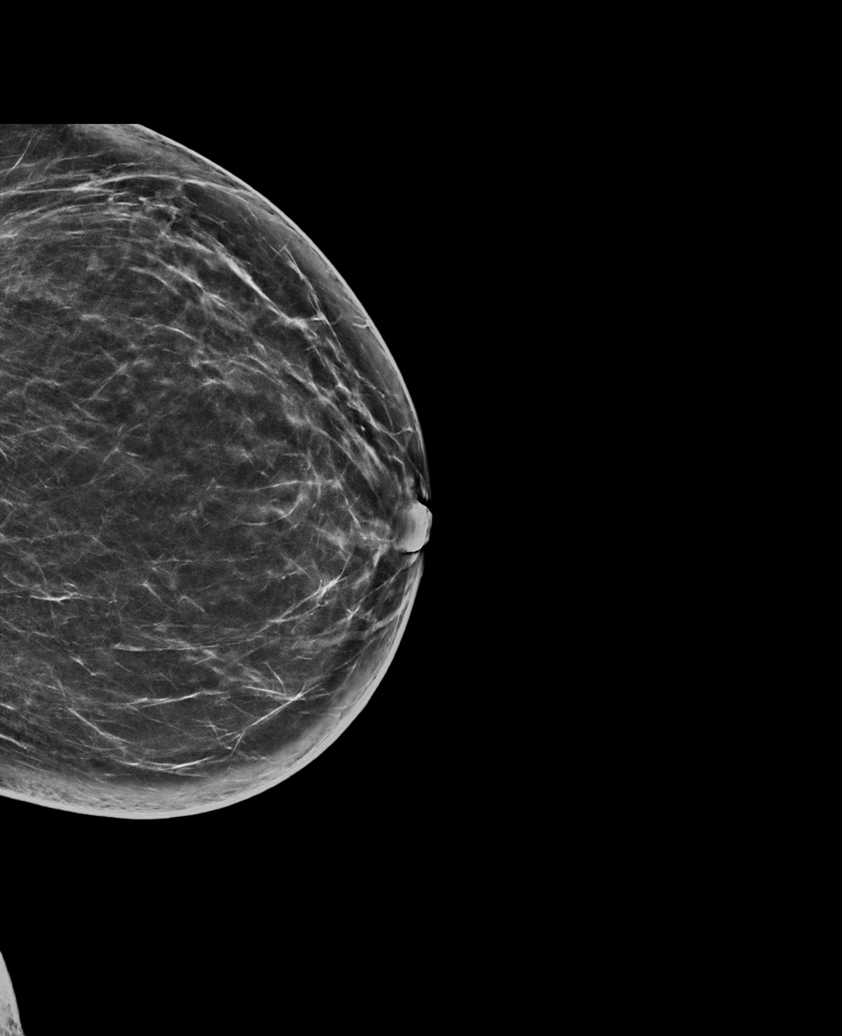

[R MLO synth-2D]
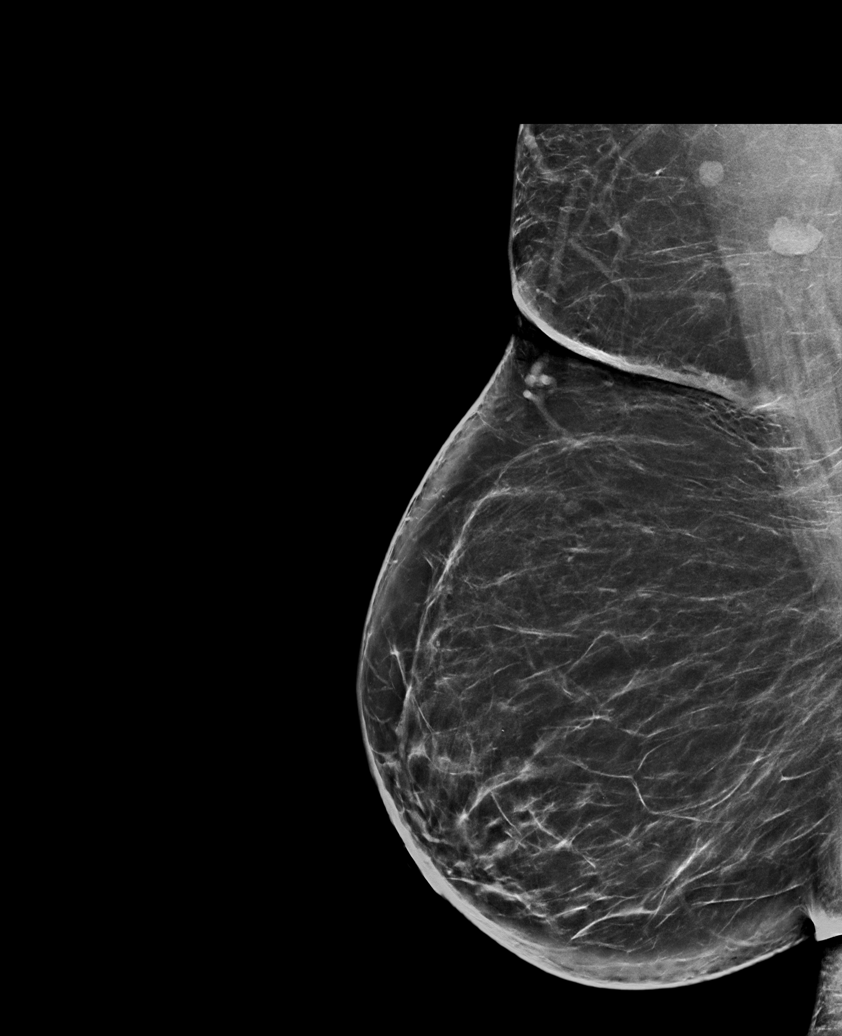

[R CC synth-2D (1 of 2)]
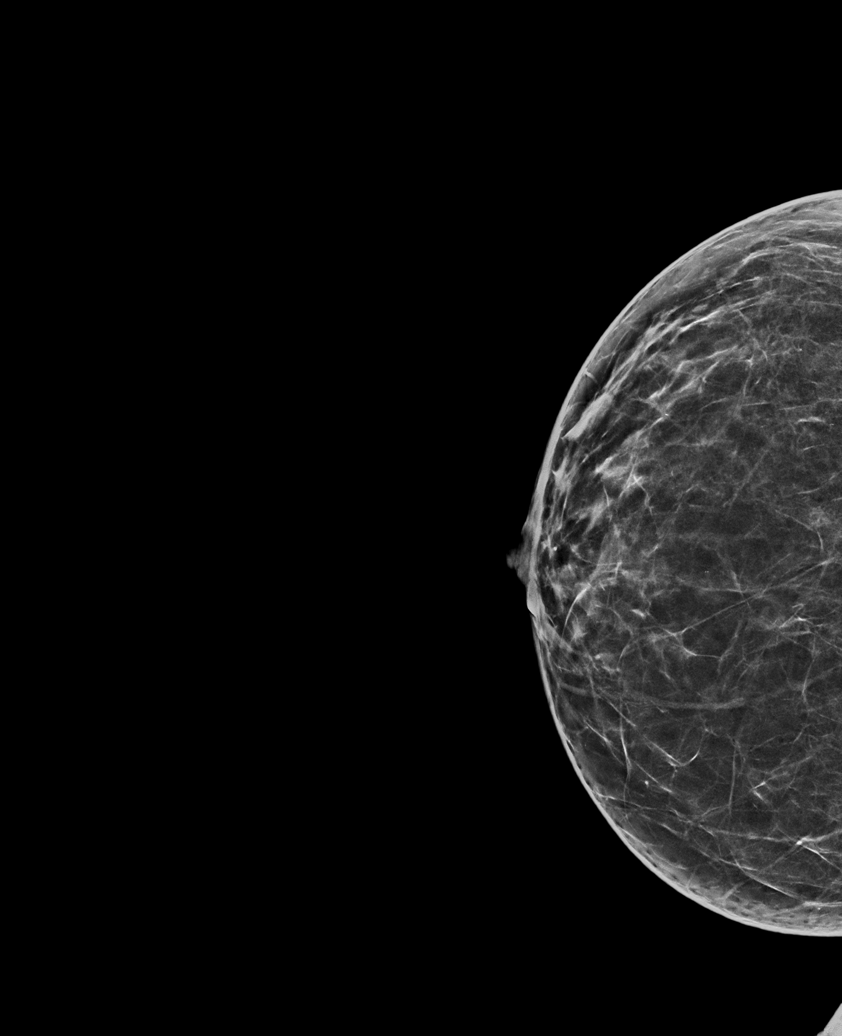

[R CC synth-2D (2 of 2)]
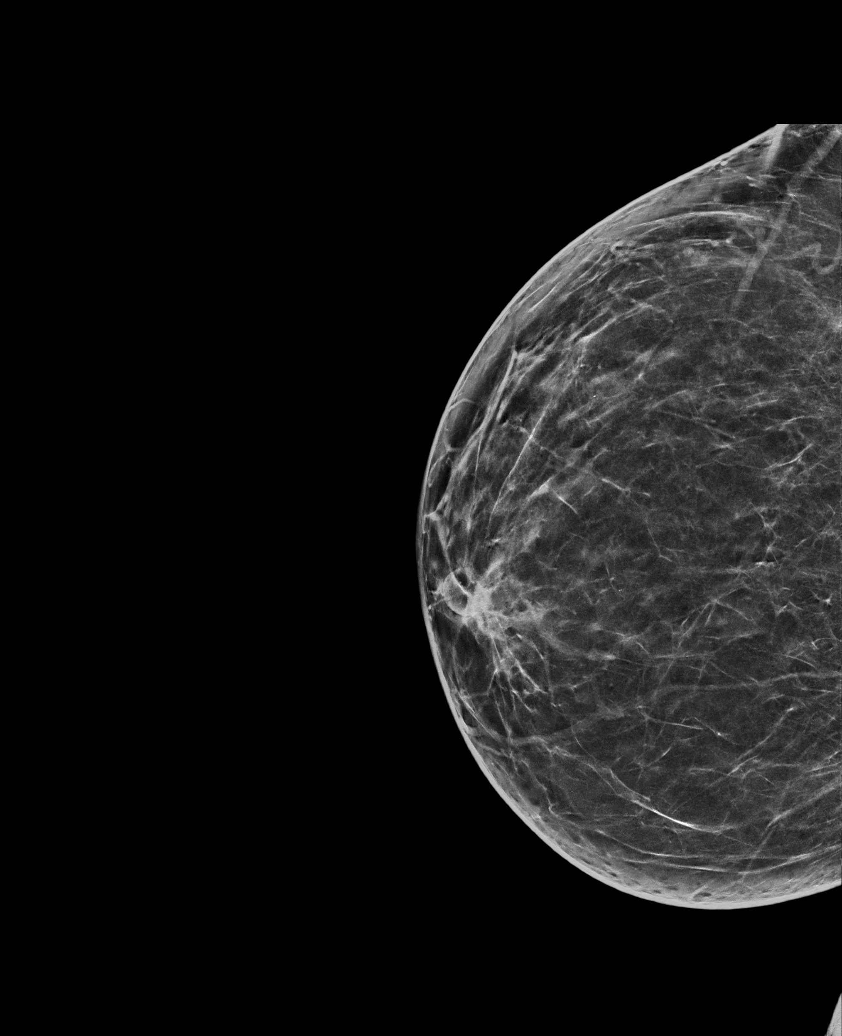

[L MLO synth-2D]
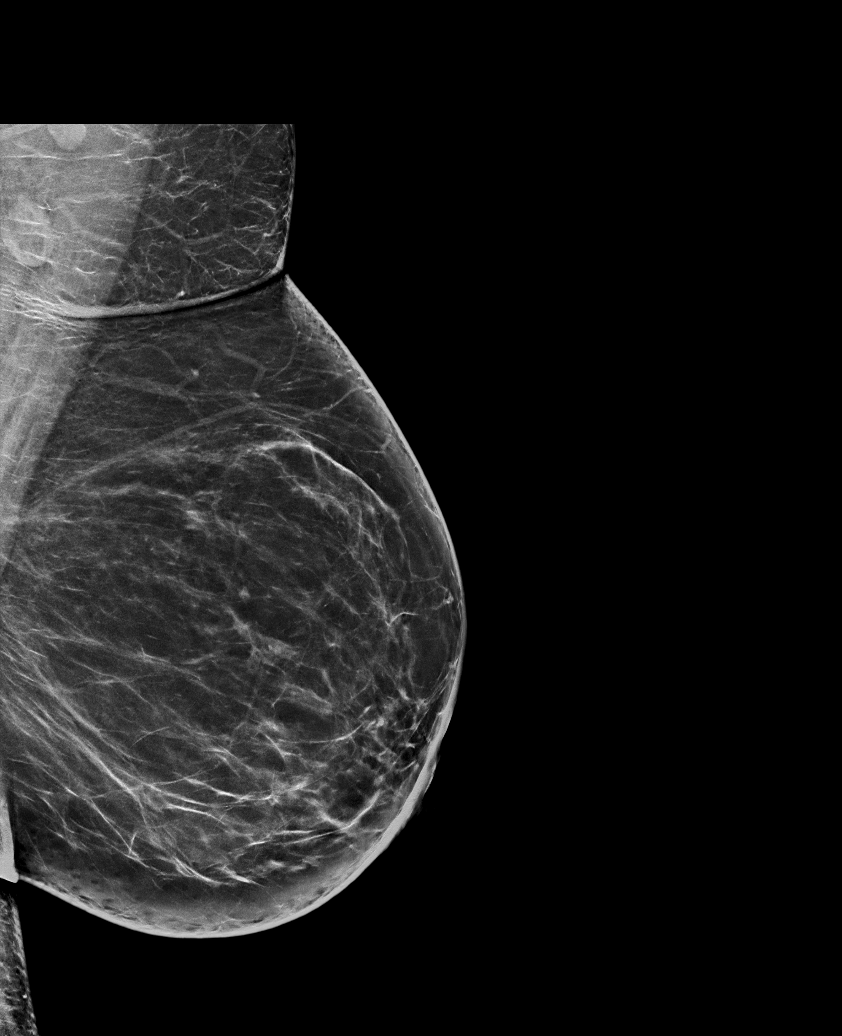

[L CC tomo · tomo slice 39/76.0]
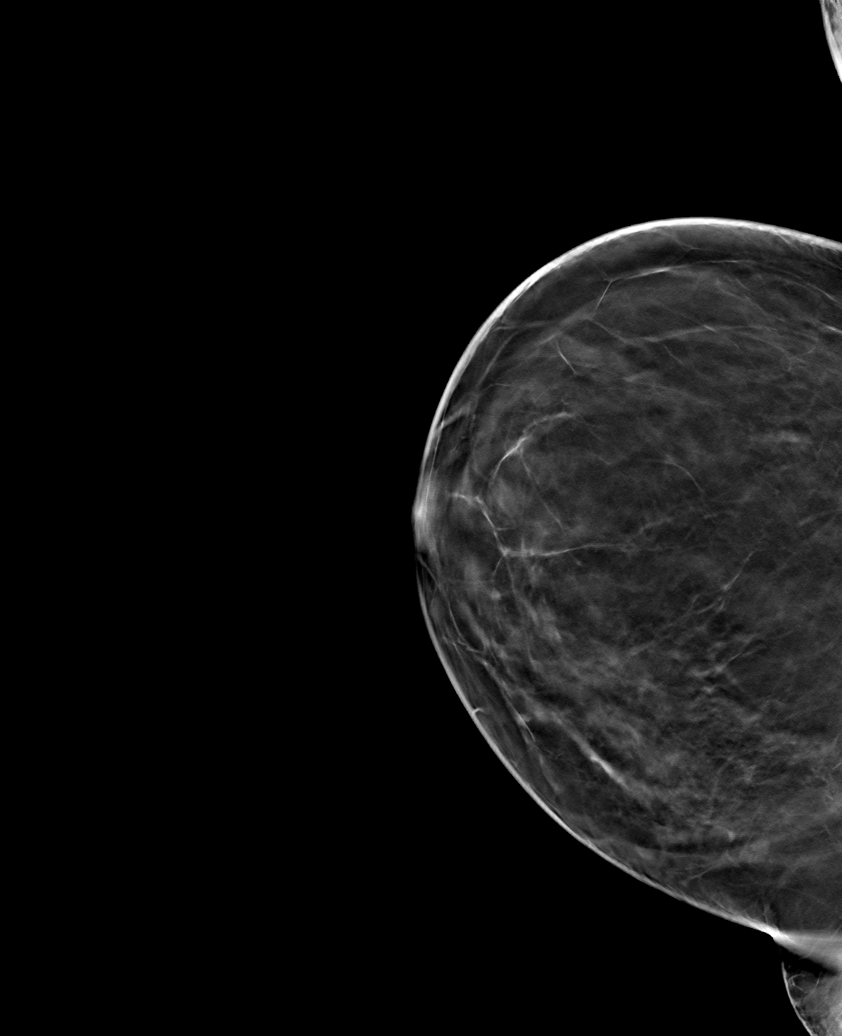

[6 of 30 positions shown; findings below may reference images not displayed]

ACR Breast Density Category b: There are scattered areas of
fibroglandular density.
FINDINGS: There are no findings suspicious for malignancy. Images were
processed with CAD.
IMPRESSION: No mammographic evidence of malignancy. A result letter of this
screening mammogram will be mailed directly to the patient.

RECOMMENDATION:
Screening mammogram in one year. (Code:[id number]-[id number])

BI-RADS CATEGORY  1: Negative.

## 2022-07-09 MED ORDER — FLUCONAZOLE 150 MG PO TABS: 150.0000 mg | ORAL_TABLET | Freq: Once | ORAL | 2 refills | Status: AC

## 2022-07-09 NOTE — Telephone Encounter (Signed)
Patient states at last office visit on 4/19 patient discussed having a back x ray for the back pain she has been experiencing. Patient checking on the status of order

## 2022-07-09 NOTE — Progress Notes (Signed)
   Subjective:    Patient ID: Alison Landry, female    DOB: 08/11/1971, 51 y.o.   MRN: [id number]  HPI 51 yo single P3 here for colposcopy. Her recent pap showed ASCUS + HR HPV. She reports an abnormal pap and "laser" surgery of her cervix in the distant past. She reports normal pap smears since then until now.  She has been abstinent for about a year.   Review of Systems She has had [place], EMBXs, and ablation. She reports that her sugars are out of control and she has recurrent yeast vaginitis.    Objective:   Physical Exam Well nourished, well hydrated White female, no apparent distress She is ambulating and conversing normally. UPT negative, consent signed, time out done Cervix prepped with acetic acid. Transformation zone seen in its entirety. Colpo adequate. Colpo entirely normal ECC obtained. She tolerated the procedure well.      Assessment & Plan:   ASCUS + HR HPV- await ECC results I will treat her yeast vaginitis with diflucan with 1 refill.

## 2022-07-09 NOTE — Telephone Encounter (Signed)
She is due for follow up visit for diabetes and cholesterol. Needs to schedule appt and we can discuss at her appointment.

## 2022-07-10 NOTE — Telephone Encounter (Signed)
Called pt, no answer. Left VM to call back to schedule OV.

## 2022-07-10 NOTE — Telephone Encounter (Signed)
Attempted to reach pt. Call could not be completed. If Pt calls in Okay for PEC to advise and scheduled appt.

## 2022-07-11 ENCOUNTER — Encounter: Payer: Self-pay | Admitting: Obstetrics & Gynecology

## 2022-07-11 LAB — SURGICAL PATHOLOGY

## 2022-07-11 NOTE — Telephone Encounter (Signed)
Unable to reach pt

## 2022-07-11 NOTE — Telephone Encounter (Signed)
Attempted to reach pt, extended ring, unable to left VM to call back.

## 2022-07-16 ENCOUNTER — Encounter: Payer: Self-pay | Admitting: Obstetrics & Gynecology

## 2022-07-22 ENCOUNTER — Other Ambulatory Visit: Payer: Self-pay | Admitting: Family Medicine

## 2022-07-22 DIAGNOSIS — G8921 Chronic pain due to trauma: Secondary | ICD-10-CM

## 2022-07-22 NOTE — Telephone Encounter (Signed)
Medication Refill - Medication: oxyCODONE (OXY IR/ROXICODONE) 5 MG immediate release tablet  Has the patient contacted their pharmacy? No. No more refills   (Agent: If no, request that the patient contact the pharmacy for the refill. If patient does not wish to contact the pharmacy document the reason why and proceed with request.)   Preferred Pharmacy (with phone number or street name):  [id number] [place], [place] - [address] AT [facility] OF [address]  [address] [place] [address]  Phone: [phone] Fax: [phone]  Hours: Not open 24 hours   Has the patient been seen for an appointment in the last year OR does the patient have an upcoming appointment? Yes.    Agent: Please be advised that RX refills may take up to 3 business days. We ask that you follow-up with your pharmacy.

## 2022-07-23 MED ORDER — OXYCODONE HCL 5 MG PO TABS: 7.5000 mg | ORAL_TABLET | Freq: Three times a day (TID) | ORAL | 0 refills | Status: DC

## 2022-07-23 NOTE — Telephone Encounter (Signed)
Requested medications are due for refill today.  unsure  Requested medications are on the active medications list.  yes  Last refill. 06/02/2022 #135 0 refills  Future visit scheduled.   no  Notes to clinic.  Refill not delegated.    Requested Prescriptions  Pending Prescriptions Disp Refills   oxyCODONE (OXY IR/ROXICODONE) 5 MG immediate release tablet 135 tablet 0    Sig: Take 1.5 tablets (7.5 mg total) by mouth 3 (three) times daily.     Not Delegated - Analgesics:  Opioid Agonists Failed - 07/22/2022  1:31 PM      Failed - This refill cannot be delegated      Failed - Urine Drug Screen completed in last 360 days      Failed - Valid encounter within last 3 months    Recent Outpatient Visits           4 months ago Type 2 diabetes mellitus with hyperlipidemia [facility])   [facility] Malva Limes, MD   10 months ago Type 2 diabetes mellitus with hyperlipidemia [facility])   [facility] Malva Limes, MD   1 year ago Type 2 diabetes mellitus with hyperlipidemia [facility])   [facility] Malva Limes, MD   1 year ago Type 2 diabetes mellitus with hyperlipidemia [facility])   [facility] Malva Limes, MD   1 year ago Type 2 diabetes mellitus with hyperlipidemia [facility])   [facility] Sherrie Mustache, Demetrios Isaacs, MD

## 2022-07-30 ENCOUNTER — Other Ambulatory Visit: Payer: Self-pay | Admitting: Family Medicine

## 2022-07-30 DIAGNOSIS — E1169 Type 2 diabetes mellitus with other specified complication: Secondary | ICD-10-CM

## 2022-08-22 ENCOUNTER — Other Ambulatory Visit: Payer: Self-pay | Admitting: Family Medicine

## 2022-08-22 DIAGNOSIS — G8921 Chronic pain due to trauma: Secondary | ICD-10-CM

## 2022-08-22 NOTE — Telephone Encounter (Signed)
Medication Refill - Medication:  oxyCODONE (OXY IR/ROXICODONE) 5 MG immediate release tablet  Has the patient contacted their pharmacy? Yes.    [facility] #[id number] Lorina Rabon, [address] AT [place] Phone:  [phone]  Fax:  [phone]     Preferred Pharmacy (with phone number or street name):  Has the patient been seen for an appointment in the last year OR does the patient have an upcoming appointment? Yes.

## 2022-08-22 NOTE — Telephone Encounter (Signed)
Requested medication (s) are due for refill today:yes  Requested medication (s) are on the active medication list: yes  Last refill:  07/23/22  Future visit scheduled: yes  Notes to clinic:  Unable to refill per protocol, cannot delegate.      Requested Prescriptions  Pending Prescriptions Disp Refills   oxyCODONE (OXY IR/ROXICODONE) 5 MG immediate release tablet 135 tablet 0    Sig: Take 1.5 tablets (7.5 mg total) by mouth 3 (three) times daily.     Not Delegated - Analgesics:  Opioid Agonists Failed - 08/22/2022 10:23 AM      Failed - This refill cannot be delegated      Failed - Urine Drug Screen completed in last 360 days      Failed - Valid encounter within last 3 months    Recent Outpatient Visits           5 months ago Type 2 diabetes mellitus with hyperlipidemia [facility])   [facility] Birdie Sons, MD   11 months ago Type 2 diabetes mellitus with hyperlipidemia [facility])   [facility] Birdie Sons, MD   1 year ago Type 2 diabetes mellitus with hyperlipidemia [facility])   [facility] Birdie Sons, MD   1 year ago Type 2 diabetes mellitus with hyperlipidemia [facility])   [facility] Birdie Sons, MD   1 year ago Type 2 diabetes mellitus with hyperlipidemia [facility])   [facility] Birdie Sons, MD       Future Appointments             In 2 weeks Fisher, Kirstie Peri, MD [facility] [place]

## 2022-08-23 ENCOUNTER — Other Ambulatory Visit: Payer: Self-pay | Admitting: Family Medicine

## 2022-08-23 DIAGNOSIS — E1169 Type 2 diabetes mellitus with other specified complication: Secondary | ICD-10-CM

## 2022-08-23 MED ORDER — OXYCODONE HCL 5 MG PO TABS: 7.5000 mg | ORAL_TABLET | Freq: Three times a day (TID) | ORAL | 0 refills | Status: DC

## 2022-08-25 ENCOUNTER — Other Ambulatory Visit: Payer: Self-pay | Admitting: Family Medicine

## 2022-09-09 ENCOUNTER — Ambulatory Visit: Payer: Medicare Other | Admitting: Family Medicine

## 2022-09-19 ENCOUNTER — Other Ambulatory Visit: Payer: Self-pay | Admitting: Family Medicine

## 2022-09-19 DIAGNOSIS — G8921 Chronic pain due to trauma: Secondary | ICD-10-CM

## 2022-09-19 MED ORDER — OXYCODONE HCL 5 MG PO TABS: 7.5000 mg | ORAL_TABLET | Freq: Three times a day (TID) | ORAL | 0 refills | Status: DC

## 2022-09-19 NOTE — Telephone Encounter (Signed)
Requested medications are due for refill today.  yes  Requested medications are on the active medications list.  yes  Last refill. 08/23/2022 #135 0 rf  Future visit scheduled.   yes  Notes to clinic.  Refill not delegated.    Requested Prescriptions  Pending Prescriptions Disp Refills   oxyCODONE (OXY IR/ROXICODONE) 5 MG immediate release tablet 135 tablet 0    Sig: Take 1.5 tablets (7.5 mg total) by mouth 3 (three) times daily.     Not Delegated - Analgesics:  Opioid Agonists Failed - 09/19/2022 10:48 AM      Failed - This refill cannot be delegated      Failed - Urine Drug Screen completed in last 360 days      Failed - Valid encounter within last 3 months    Recent Outpatient Visits           6 months ago Type 2 diabetes mellitus with hyperlipidemia [facility]   [facility] Birdie Sons, MD   1 year ago Type 2 diabetes mellitus with hyperlipidemia [facility])   [facility] Birdie Sons, MD   1 year ago Type 2 diabetes mellitus with hyperlipidemia [facility])   [facility] Birdie Sons, MD   1 year ago Type 2 diabetes mellitus with hyperlipidemia [facility])   [facility] Birdie Sons, MD   1 year ago Type 2 diabetes mellitus with hyperlipidemia [facility])   [facility] Birdie Sons, MD       Future Appointments             In 1 week Fisher, Kirstie Peri, MD [facility] [place]

## 2022-09-19 NOTE — Telephone Encounter (Signed)
Medication Refill - Medication: oxyCODONE (OXY IR/ROXICODONE) 5 MG immediate release tablet   Has the patient contacted their pharmacy? Yes.   (Agent: If no, request that the patient contact the pharmacy for the refill. If patient does not wish to contact the pharmacy document the reason why and proceed with request.) (Agent: If yes, when and what did the pharmacy advise?)  Preferred Pharmacy (with phone number or street name):  [facility] #[id number] Lorina Rabon, [place] [address]  [place] [address]  Phone: [phone] Fax: [phone]   Has the patient been seen for an appointment in the last year OR does the patient have an upcoming appointment? Yes.    Agent: Please be advised that RX refills may take up to 3 business days. We ask that you follow-up with your pharmacy.

## 2022-09-20 ENCOUNTER — Other Ambulatory Visit: Payer: Self-pay | Admitting: Family Medicine

## 2022-09-20 DIAGNOSIS — E1169 Type 2 diabetes mellitus with other specified complication: Secondary | ICD-10-CM

## 2022-09-27 ENCOUNTER — Encounter: Payer: Self-pay | Admitting: Family Medicine

## 2022-09-27 ENCOUNTER — Ambulatory Visit (INDEPENDENT_AMBULATORY_CARE_PROVIDER_SITE_OTHER): Payer: Medicare Other | Admitting: Family Medicine

## 2022-09-27 VITALS — BP 154/80 | HR 102 | Temp 98.0°F | Resp 16 | Wt 197.0 lb

## 2022-09-27 DIAGNOSIS — F1190 Opioid use, unspecified, uncomplicated: Secondary | ICD-10-CM

## 2022-09-27 DIAGNOSIS — M5410 Radiculopathy, site unspecified: Secondary | ICD-10-CM

## 2022-09-27 DIAGNOSIS — E1169 Type 2 diabetes mellitus with other specified complication: Secondary | ICD-10-CM | POA: Diagnosis not present

## 2022-09-27 DIAGNOSIS — I10 Essential (primary) hypertension: Secondary | ICD-10-CM

## 2022-09-27 DIAGNOSIS — Z981 Arthrodesis status: Secondary | ICD-10-CM

## 2022-09-27 DIAGNOSIS — Z6835 Body mass index (BMI) 35.0-35.9, adult: Secondary | ICD-10-CM

## 2022-09-27 DIAGNOSIS — G909 Disorder of the autonomic nervous system, unspecified: Secondary | ICD-10-CM | POA: Diagnosis not present

## 2022-09-27 DIAGNOSIS — E785 Hyperlipidemia, unspecified: Secondary | ICD-10-CM | POA: Diagnosis not present

## 2022-09-27 DIAGNOSIS — G8929 Other chronic pain: Secondary | ICD-10-CM | POA: Diagnosis not present

## 2022-09-27 MED ORDER — DEXCOM G6 SENSOR MISC: 1.0000 [IU] | Freq: Four times a day (QID) | 4 refills | Status: DC

## 2022-09-27 MED ORDER — DEXCOM G6 TRANSMITTER MISC: 1.0000 [IU] | Freq: Four times a day (QID) | 4 refills | Status: AC
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
## 2022-09-28 LAB — PAIN MGT SCRN (14 DRUGS), UR
Amphetamine Scrn, Ur: NEGATIVE ng/mL
BARBITURATE SCREEN URINE: NEGATIVE ng/mL
BENZODIAZEPINE SCREEN, URINE: NEGATIVE ng/mL
Buprenorphine, Urine: NEGATIVE ng/mL
CANNABINOIDS UR QL SCN: NEGATIVE ng/mL
Cocaine (Metab) Scrn, Ur: NEGATIVE ng/mL
Creatinine(Crt), U: 128.4 mg/dL (ref 20.0–300.0)
Fentanyl, Urine: NEGATIVE pg/mL
Meperidine Screen, Urine: NEGATIVE ng/mL
Methadone Screen, Urine: NEGATIVE ng/mL
OXYCODONE+OXYMORPHONE UR QL SCN: POSITIVE ng/mL — AB
Opiate Scrn, Ur: NEGATIVE ng/mL
Ph of Urine: 5.6 (ref 4.5–8.9)
Phencyclidine Qn, Ur: NEGATIVE ng/mL
Propoxyphene Scrn, Ur: NEGATIVE ng/mL
Tramadol Screen, Urine: NEGATIVE ng/mL

## 2022-10-01 DIAGNOSIS — E785 Hyperlipidemia, unspecified: Secondary | ICD-10-CM | POA: Diagnosis not present

## 2022-10-01 DIAGNOSIS — G909 Disorder of the autonomic nervous system, unspecified: Secondary | ICD-10-CM | POA: Diagnosis not present

## 2022-10-01 DIAGNOSIS — E1169 Type 2 diabetes mellitus with other specified complication: Secondary | ICD-10-CM | POA: Diagnosis not present

## 2022-10-02 LAB — CBC
Hematocrit: 39.5 % (ref 34.0–46.6)
Hemoglobin: 14 g/dL (ref 11.1–15.9)
MCH: 29.8 pg (ref 26.6–33.0)
MCHC: 35.4 g/dL (ref 31.5–35.7)
MCV: 84 fL (ref 79–97)
Platelets: 310 10*3/uL (ref 150–450)
RBC: 4.7 x10E6/uL (ref 3.77–5.28)
RDW: 11.4 % — ABNORMAL LOW (ref 11.7–15.4)
WBC: 7.3 10*3/uL (ref 3.4–10.8)

## 2022-10-02 LAB — COMPREHENSIVE METABOLIC PANEL
ALT: 38 IU/L — ABNORMAL HIGH (ref 0–32)
AST: 26 IU/L (ref 0–40)
Albumin/Globulin Ratio: 1.8 (ref 1.2–2.2)
Albumin: 4.5 g/dL (ref 3.9–4.9)
Alkaline Phosphatase: 127 IU/L — ABNORMAL HIGH (ref 44–121)
BUN/Creatinine Ratio: 13 (ref 9–23)
BUN: 8 mg/dL (ref 6–24)
Bilirubin Total: 0.2 mg/dL (ref 0.0–1.2)
CO2: 21 mmol/L (ref 20–29)
Calcium: 9.5 mg/dL (ref 8.7–10.2)
Chloride: 100 mmol/L (ref 96–106)
Creatinine, Ser: 0.62 mg/dL (ref 0.57–1.00)
Globulin, Total: 2.5 g/dL (ref 1.5–4.5)
Glucose: 183 mg/dL — ABNORMAL HIGH (ref 70–99)
Potassium: 3.9 mmol/L (ref 3.5–5.2)
Sodium: 138 mmol/L (ref 134–144)
Total Protein: 7 g/dL (ref 6.0–8.5)
eGFR: 109 mL/min/{1.73_m2} (ref >59)

## 2022-10-02 LAB — LIPID PANEL
Chol/HDL Ratio: 5.4 ratio — ABNORMAL HIGH (ref 0.0–4.4)
Cholesterol, Total: 198 mg/dL (ref 100–199)
HDL: 37 mg/dL — ABNORMAL LOW (ref >39)
LDL Chol Calc (NIH): 100 mg/dL — ABNORMAL HIGH (ref 0–99)
Triglycerides: 363 mg/dL — ABNORMAL HIGH (ref 0–149)
VLDL Cholesterol Cal: 61 mg/dL — ABNORMAL HIGH (ref 5–40)

## 2022-10-02 LAB — HEMOGLOBIN A1C
Est. average glucose Bld gHb Est-mCnc: 180 mg/dL
Hgb A1c MFr Bld: 7.9 % — ABNORMAL HIGH (ref 4.8–5.6)

## 2022-10-02 LAB — VITAMIN B12: Vitamin B-12: 278 pg/mL (ref 232–1245)

## 2022-10-02 NOTE — Addendum Note (Signed)
Addended by: Randal Buba on: 10/02/2022 04:38 PM   Modules accepted: Orders

## 2022-10-03 ENCOUNTER — Other Ambulatory Visit: Payer: Self-pay | Admitting: Family Medicine

## 2022-10-03 DIAGNOSIS — E781 Pure hyperglyceridemia: Secondary | ICD-10-CM

## 2022-10-03 MED ORDER — ATORVASTATIN CALCIUM 20 MG PO TABS: 20.0000 mg | ORAL_TABLET | Freq: Every day | ORAL | 3 refills | Status: DC

## 2022-10-03 NOTE — Addendum Note (Signed)
Addended by: Birdie Sons on: 10/03/2022 07:29 AM   Modules accepted: Orders

## 2022-10-04 ENCOUNTER — Emergency Department
Admission: EM | Admit: 2022-10-04 | Discharge: 2022-10-04 | Disposition: A | Discharge status: Home / Self Care | Payer: Medicare Other | Attending: Emergency Medicine | Admitting: Emergency Medicine

## 2022-10-04 ENCOUNTER — Encounter: Payer: Self-pay | Admitting: Emergency Medicine

## 2022-10-04 ENCOUNTER — Other Ambulatory Visit: Payer: Self-pay

## 2022-10-04 ENCOUNTER — Emergency Department: Payer: Medicare Other

## 2022-10-04 DIAGNOSIS — E119 Type 2 diabetes mellitus without complications: Secondary | ICD-10-CM | POA: Diagnosis not present

## 2022-10-04 DIAGNOSIS — R519 Headache, unspecified: Secondary | ICD-10-CM | POA: Insufficient documentation

## 2022-10-04 DIAGNOSIS — R1310 Dysphagia, unspecified: Secondary | ICD-10-CM | POA: Diagnosis not present

## 2022-10-04 DIAGNOSIS — M542 Cervicalgia: Secondary | ICD-10-CM | POA: Diagnosis not present

## 2022-10-04 DIAGNOSIS — M6520 Calcific tendinitis, unspecified site: Secondary | ICD-10-CM

## 2022-10-04 LAB — CBC WITH DIFFERENTIAL/PLATELET
Abs Immature Granulocytes: 0.06 10*3/uL (ref 0.00–0.07)
Basophils Absolute: 0 10*3/uL (ref 0.0–0.1)
Basophils Relative: 1 %
Eosinophils Absolute: 0.1 10*3/uL (ref 0.0–0.5)
Eosinophils Relative: 2 %
HCT: 41 % (ref 36.0–46.0)
Hemoglobin: 14.4 g/dL (ref 12.0–15.0)
Immature Granulocytes: 1 %
Lymphocytes Relative: 29 %
Lymphs Abs: 2.4 10*3/uL (ref 0.7–4.0)
MCH: 29.3 pg (ref 26.0–34.0)
MCHC: 35.1 g/dL (ref 30.0–36.0)
MCV: 83.5 fL (ref 80.0–100.0)
Monocytes Absolute: 0.5 10*3/uL (ref 0.1–1.0)
Monocytes Relative: 6 %
Neutro Abs: 5.2 10*3/uL (ref 1.7–7.7)
Neutrophils Relative %: 61 %
Platelets: 314 10*3/uL (ref 150–400)
RBC: 4.91 MIL/uL (ref 3.87–5.11)
RDW: 11.9 % (ref 11.5–15.5)
WBC: 8.3 10*3/uL (ref 4.0–10.5)
nRBC: 0 % (ref 0.0–0.2)

## 2022-10-04 LAB — BASIC METABOLIC PANEL
Anion gap: 9 (ref 5–15)
BUN: 9 mg/dL (ref 6–20)
CO2: 23 mmol/L (ref 22–32)
Calcium: 9.3 mg/dL (ref 8.9–10.3)
Chloride: 104 mmol/L (ref 98–111)
Creatinine, Ser: 0.55 mg/dL (ref 0.44–1.00)
GFR, Estimated: >60 mL/min (ref >60)
Glucose, Bld: 311 mg/dL — ABNORMAL HIGH (ref 70–99)
Potassium: 3.8 mmol/L (ref 3.5–5.1)
Sodium: 136 mmol/L (ref 135–145)

## 2022-10-04 MED ORDER — IBUPROFEN 600 MG PO TABS: 600.0000 mg | ORAL_TABLET | Freq: Four times a day (QID) | ORAL | 0 refills | Status: AC | PRN

## 2022-10-04 MED ORDER — FENTANYL CITRATE PF 50 MCG/ML IJ SOSY
50.0000 ug | PREFILLED_SYRINGE | Freq: Once | INTRAMUSCULAR | Status: AC
  Administered 2022-10-04: 50 ug via INTRAVENOUS
  Filled 2022-10-04: qty 1

## 2022-10-04 MED ORDER — KETOROLAC TROMETHAMINE 30 MG/ML IJ SOLN
30.0000 mg | Freq: Once | INTRAMUSCULAR | Status: AC
  Administered 2022-10-04: 30 mg via INTRAVENOUS
  Filled 2022-10-04: qty 1

## 2022-10-04 MED ORDER — IOHEXOL 300 MG/ML  SOLN
75.0000 mL | Freq: Once | INTRAMUSCULAR | Status: AC | PRN
  Administered 2022-10-04: 75 mL via INTRAVENOUS

## 2022-10-04 NOTE — ED Notes (Signed)
Pt. To CT

## 2022-10-04 NOTE — ED Triage Notes (Signed)
Pt to ED from home c/o neck pain since Tuesday radiating to lymph nodes in neck, states right side feels better but left still painful.  States pain when touching chin to chest and severe pain when looking up but is able to.  States the pain is like pressure and "pressure in my brain".  Denies known fevers at home, nausea without vomiting or diarrhea.  Denies visual changes.  Pt A&Ox4, chest rise even and unlabored, skin WNL and in NAD at this time.

## 2022-10-04 NOTE — ED Notes (Signed)
No protocols at this time per Dr. Goodman. 

## 2022-10-04 NOTE — Discharge Instructions (Signed)
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  This is inflammation that generally gets better over 1 or 2 weeks.  Take the ibuprofen 600 mg every 6 hours (with some food in your stomach).  You may take the oxycodone or tramadol that you already have if needed for any breakthrough pain.  Follow-up with your regular doctor.  Return to the ER for new, worsening, or persistent severe neck pain or headache, weakness or numbness, difficulty speaking or swallowing, or any other new or worsening symptoms that concern you.

## 2022-10-04 NOTE — ED Provider Notes (Signed)
[facility] Provider Note    Event Date/Time   First MD Initiated Contact with Patient 10/04/22 [phone]     (approximate)   History   Neck Pain   HPI  Alison Landry is a 51 y.o. female who presented to the emergency department today because of concerns for neck pain.  The patient states that she noticed that upon waking up 3 days ago.  She denies any unusual activity or trauma to her head or neck prior to this pain starting.  She states it is located primarily in the back and on the right side although wraps around the right side of her neck and gets under her jaw.  She has had some discomfort with swallowing with this and with opening her mouth wide.  Patient denies any fevers.  She denies similar symptoms in the past.     Physical Exam   Triage Vital Signs: ED Triage Vitals  Enc Vitals Group     BP 10/04/22 0215 (!) 181/103     Pulse Rate 10/04/22 0215 97     Resp 10/04/22 0215 16     Temp 10/04/22 0215 98.3 F (36.8 C)     Temp Source 10/04/22 0215 Oral     SpO2 10/04/22 0215 97 %     Weight 10/04/22 0217 192 lb (87.1 kg)     Height 10/04/22 0217 5\' 4"  (1.626 m)     Head Circumference --      Peak Flow --      Pain Score 10/04/22 0216 10     Pain Loc --      Pain Edu? --      Excl. in GC? --     Most recent vital signs: Vitals:   10/04/22 0215  BP: (!) 181/103  Pulse: 97  Resp: 16  Temp: 98.3 F (36.8 C)  SpO2: 97%   General: Awake, alert, oriented. CV:  Good peripheral perfusion. Regular rate and rhythm. Resp:  Normal effort. Lungs clear. Abd:  No distention.  Other:  No midline spinal tenderness. Negative brudzinski and kernig sign. No ams.    ED Results / Procedures / Treatments   Labs (all labs ordered are listed, but only abnormal results are displayed) Pending at time of sign out   EKG  None   RADIOLOGY Pending at time of sign out   PROCEDURES:  Critical Care performed: No  Procedures   MEDICATIONS  ORDERED IN ED: Medications - No data to display   IMPRESSION / MDM / ASSESSMENT AND PLAN / ED COURSE  I reviewed the triage vital signs and the nursing notes.                              Differential diagnosis includes, but is not limited to, cervical sprain, cervical fracture, deep tissue infection, meningitis.   Patient's presentation is most consistent with acute presentation with potential threat to life or bodily function.  Patient presented to the emergency department today because of concerns for neck pain as well as headache.  Symptoms have been going on for the past 3 days.  On exam patient is nontoxic-appearing.  Afebrile.  No midline tenderness.  No meningismus.  Will obtain CT scan of the head and neck.  Additionally will get soft tissue CT scan of the neck given patient's complaint of also having difficulty with swallowing and discomfort in the anterior portion of the right side of  her neck.  We will check blood work.  Will give patient medication to help with her symptoms.  Awaiting results of blood work and imaging at time of signout.  FINAL CLINICAL IMPRESSION(S) / ED DIAGNOSES   Neck pain       Note:  This document was prepared using Dragon voice recognition software and may include unintentional dictation errors.    Nance Pear, MD 10/04/22 [phone]

## 2022-10-04 NOTE — ED Notes (Signed)
This RN into room to assess pt. Pt. Is laying, HOB elevated, eyes closed, snoring gently, chest rise and fall, NAD. 

## 2022-10-04 NOTE — ED Notes (Signed)
This RN into room to assess pt. Pt. Is laying, HOB elevated, eyes closed, snoring gently, chest rise and fall, NAD.

## 2022-10-04 NOTE — ED Provider Notes (Signed)
-----------------------------------------   10:35 AM on 10/04/2022 -----------------------------------------  I took over care on this patient from Dr. Archie Balboa.  I independently viewed and interpreted the CT head which shows no ICH or other acute abnormality.  CT of the neck shows findings consistent with calcific tendinitis of the longus colli.  This is very consistent with the patient's description of her symptoms.  It is self-limited and treated with NSAIDs.  I did consider whether she may benefit from steroids, however given her diabetes I am concerned that prednisone would increase her blood sugar without necessarily providing any specific benefit.  I gave the patient a dose of Toradol and she reports significant relief.  On reassessment she appears comfortable and is eating without difficulty.  At this time, she is stable for discharge home.  I counseled her on the results of the work-up and the plan of care.  I gave her strict return precautions and she expresses understanding.  She does not require any specialty follow-up and can see her PMD.   Arta Silence, MD 10/04/22 1037

## 2022-10-21 ENCOUNTER — Other Ambulatory Visit: Payer: Self-pay | Admitting: Family Medicine

## 2022-10-21 ENCOUNTER — Telehealth: Payer: Self-pay

## 2022-10-21 DIAGNOSIS — K2100 Gastro-esophageal reflux disease with esophagitis, without bleeding: Secondary | ICD-10-CM

## 2022-10-21 DIAGNOSIS — G8921 Chronic pain due to trauma: Secondary | ICD-10-CM

## 2022-10-21 DIAGNOSIS — E1169 Type 2 diabetes mellitus with other specified complication: Secondary | ICD-10-CM

## 2022-10-21 NOTE — Telephone Encounter (Signed)
Copied from CRM [id number]. Topic: General - Other >> Oct 21, 2022  9:49 AM Esperanza Sheets wrote: Pt states she was advised by the pharmacy that a PA request was sent to provider for the dexcom meter  Pt wanting a status update  Please fu w/ pt

## 2022-10-21 NOTE — Telephone Encounter (Signed)
Medication Refill - Medication: oxyCODONE (OXY IR/ROXICODONE) 5 MG immediate release tablet   Has the patient contacted their pharmacy? No.  Preferred Pharmacy (with phone number or street name):  [facility] #[id number] [place] Rough, [place] - [address] AT [facility] [address] Phone: [phone]  Fax: [phone]     Has the patient been seen for an appointment in the last year OR does the patient have an upcoming appointment? Yes.    Agent: Please be advised that RX refills may take up to 3 business days. We ask that you follow-up with your pharmacy.

## 2022-10-21 NOTE — Telephone Encounter (Signed)
Requested medication (s) are due for refill today: yes  Requested medication (s) are on the active medication list: yes  Last refill:  09/19/22  Future visit scheduled: yes  Notes to clinic:  Unable to refill per protocol, cannot delegate.      Requested Prescriptions  Pending Prescriptions Disp Refills   oxyCODONE (OXY IR/ROXICODONE) 5 MG immediate release tablet 135 tablet 0    Sig: Take 1.5 tablets (7.5 mg total) by mouth 3 (three) times daily.     Not Delegated - Analgesics:  Opioid Agonists Failed - 10/21/2022 11:36 AM      Failed - This refill cannot be delegated      Failed - Urine Drug Screen completed in last 360 days      Passed - Valid encounter within last 3 months    Recent Outpatient Visits           3 weeks ago Back pain with radiculopathy   [facility] Malva Limes, MD   7 months ago Type 2 diabetes mellitus with hyperlipidemia [facility])   [facility] Malva Limes, MD   1 year ago Type 2 diabetes mellitus with hyperlipidemia [facility])   [facility] Malva Limes, MD   1 year ago Type 2 diabetes mellitus with hyperlipidemia [facility])   [facility] Mal[facility]imes, MD   1 year ago Type 2 diabetes mellitus with hyperlipidemia [facility])   [facility] Sherrie Mustache, Demetrios Isaacs, MD

## 2022-10-22 MED ORDER — OXYCODONE HCL 5 MG PO TABS: 7.5000 mg | ORAL_TABLET | Freq: Three times a day (TID) | ORAL | 0 refills | Status: DC

## 2022-10-29 NOTE — Telephone Encounter (Signed)
Patient called to get an update on the paperwork for a Dexcom meter. Please follow up with patient.

## 2022-10-30 NOTE — Telephone Encounter (Signed)
[employer] sent a fax stating that PA is in CoverMyMeds, but there is nothing in CoverMyMeds, and I have not received any PA forms.

## 2022-10-30 NOTE — Telephone Encounter (Signed)
PA started on covermymeds. Called patient for rx insurance information. RXBIN [id number]  PCN [id number]  [employer] COS

## 2022-11-01 NOTE — Telephone Encounter (Addendum)
Message in covermymeds, states patient is covered for Dexcom until 12/01/2022.

## 2022-11-19 ENCOUNTER — Other Ambulatory Visit: Payer: Self-pay | Admitting: Family Medicine

## 2022-11-19 DIAGNOSIS — G8921 Chronic pain due to trauma: Secondary | ICD-10-CM

## 2022-11-19 MED ORDER — OXYCODONE HCL 5 MG PO TABS: 7.5000 mg | ORAL_TABLET | Freq: Three times a day (TID) | ORAL | 0 refills | Status: DC

## 2022-11-19 NOTE — Telephone Encounter (Signed)
Requested medication (s) are due for refill today - yes  Requested medication (s) are on the active medication list -yes  Future visit scheduled -no  Last refill: 10/22/22 #135  Notes to clinic: non delegated Rx  Requested Prescriptions  Pending Prescriptions Disp Refills   oxyCODONE (OXY IR/ROXICODONE) 5 MG immediate release tablet 135 tablet 0    Sig: Take 1.5 tablets (7.5 mg total) by mouth 3 (three) times daily.     Not Delegated - Analgesics:  Opioid Agonists Failed - 11/19/2022  9:47 AM      Failed - This refill cannot be delegated      Failed - Urine Drug Screen completed in last 360 days      Passed - Valid encounter within last 3 months    Recent Outpatient Visits           1 month ago Back pain with radiculopathy   [facility] Malva Limes, MD   8 months ago Type 2 diabetes mellitus with hyperlipidemia [facility])   [facility]imes, MD   1 year ago Type 2 diabetes mellitus with hyperlipidemia [facility])   [facility] Mal[facility] Limes, MD   1 year ago Type 2 diabetes mellitus with hyperlipidemia [facility])   [facility] Mal[facility] Limes, MD   1 year ago Type 2 diabetes mellitus with hyperlipidemia [facility])   [facility], MD                 Requested Prescriptions  Pending Prescriptions Disp Refills   oxyCODONE (OXY IR/ROXICODONE) 5 MG immediate release tablet 135 tablet 0    Sig: Take 1.5 tablets (7.5 mg total) by mouth 3 (three) times daily.     Not Delegated - Analgesics:  Opioid Agonists Failed - 11/19/2022  9:47 AM      Failed - This refill cannot be delegated      Failed - Urine Drug Screen completed in last 360 days      Passed - Valid encounter within last 3 months    Recent Outpatient Visits           1 month ago Back pain with radiculopathy   [facility] Mal[facility] Limes, MD   8 months ago Type 2 diabetes mellitus with hyperlipidemia  [facility])   [facility], MD   1 year ago Type 2 diabetes mellitus with hyperlipidemia [facility])   [facility] Mal[facility] Limes, MD   1 year ago Type 2 diabetes mellitus with hyperlipidemia [facility])   [facility] Malva Limes, MD   1 year ago Type 2 diabetes mellitus with hyperlipidemia [facility])   [facility] Sherrie Mustache, Demetrios Isaacs, MD

## 2022-11-19 NOTE — Telephone Encounter (Signed)
Pt aware Dexcom is covered until 12/01/2022

## 2022-11-19 NOTE — Telephone Encounter (Signed)
Medication Refill - Medication: oxyCODONE (OXY IR/ROXICODONE) 5 MG immediate release tablet   Has the patient contacted their pharmacy? No.   Preferred Pharmacy (with phone number or street name): [id number] - [place], [place] - [address]  Has the patient been seen for an appointment in the last year OR does the patient have an upcoming appointment? Yes.    Agent: Please be advised that RX refills may take up to 3 business days. We ask that you follow-up with your pharmacy.

## 2022-12-05 ENCOUNTER — Other Ambulatory Visit: Payer: Self-pay | Admitting: Family Medicine

## 2022-12-19 ENCOUNTER — Telehealth: Payer: Self-pay | Admitting: Family Medicine

## 2022-12-19 ENCOUNTER — Other Ambulatory Visit: Payer: Self-pay | Admitting: Family Medicine

## 2022-12-19 DIAGNOSIS — G8921 Chronic pain due to trauma: Secondary | ICD-10-CM

## 2022-12-19 MED ORDER — OXYCODONE HCL 5 MG PO TABS: 7.5000 mg | ORAL_TABLET | Freq: Three times a day (TID) | ORAL | 0 refills | Status: DC

## 2022-12-19 NOTE — Telephone Encounter (Signed)
Requested medication (s) are due for refill today - yes  Requested medication (s) are on the active medication list -yes  Future visit scheduled -no  Last refill: 11/19/22 #135  Notes to clinic: non delegated Rx  Requested Prescriptions  Pending Prescriptions Disp Refills   oxyCODONE (OXY IR/ROXICODONE) 5 MG immediate release tablet 135 tablet 0    Sig: Take 1.5 tablets (7.5 mg total) by mouth 3 (three) times daily.     Not Delegated - Analgesics:  Opioid Agonists Failed - 12/19/2022 10:23 AM      Failed - This refill cannot be delegated      Failed - Urine Drug Screen completed in last 360 days      Passed - Valid encounter within last 3 months    Recent Outpatient Visits           2 months ago Back pain with radiculopathy   [facility] Bird[facility], MD   9 months ago Type 2 diabetes mellitus with hyperlipidemia [facility])   [facility] Birdie Sons, MD   1 year ago Type 2 diabetes mellitus with hyperlipidemia [facility])   [facility] Bird[facility] Sons, MD   1 year ago Type 2 diabetes mellitus with hyperlipidemia [facility])   [facility] Bird[facility], MD   1 year ago Type 2 diabetes mellitus with hyperlipidemia [facility])   [facility] Birdie Sons, MD                 Requested Prescriptions  Pending Prescriptions Disp Refills   oxyCODONE (OXY IR/ROXICODONE) 5 MG immediate release tablet 135 tablet 0    Sig: Take 1.5 tablets (7.5 mg total) by mouth 3 (three) times daily.     Not Delegated - Analgesics:  Opioid Agonists Failed - 12/19/2022 10:23 AM      Failed - This refill cannot be delegated      Failed - Urine Drug Screen completed in last 360 days      Passed - Valid encounter within last 3 months    Recent Outpatient Visits           2 months ago Back pain with radiculopathy   [facility] Birdie Sons, MD   9 months ago Type 2 diabetes mellitus with hyperlipidemia  [facility])   [facility], MD   1 year ago Type 2 diabetes mellitus with hyperlipidemia [facility])   [facility] Bird[facility] Sons, MD   1 year ago Type 2 diabetes mellitus with hyperlipidemia [facility])   [facility] Birdie Sons, MD   1 year ago Type 2 diabetes mellitus with hyperlipidemia [facility])   [facility] Birdie Sons, MD

## 2022-12-19 NOTE — Telephone Encounter (Signed)
Medication Refill - Medication: oxyCODONE (OXY IR/ROXICODONE) 5 MG   Has the patient contacted their pharmacy? yes (Agent: If no, request that the patient contact the pharmacy for the refill. If patient does not wish to contact the pharmacy document the reason why and proceed with request.) (Agent: If yes, when and what did the pharmacy advise?)contact pcp  Preferred Pharmacy (with phone number or street name): [facility] #[id number] [address], [place]  Phone: [phone] Fax: [phone] Has the patient been seen for an appointment in the last year OR does the patient have an upcoming appointment? yes  Agent: Please be advised that RX refills may take up to 3 business days. We ask that you follow-up with your pharmacy.

## 2022-12-19 NOTE — Telephone Encounter (Signed)
LVM for pt to rtn my call to re-schedule AWV with [facility] on 01/30/23 call back # [phone] 

## 2022-12-29 ENCOUNTER — Ambulatory Visit: Admission: RE | Admit: 2022-12-29 | Payer: Medicare Other | Source: Ambulatory Visit

## 2023-01-20 ENCOUNTER — Other Ambulatory Visit: Payer: Self-pay | Admitting: Family Medicine

## 2023-01-20 DIAGNOSIS — G8921 Chronic pain due to trauma: Secondary | ICD-10-CM

## 2023-01-20 NOTE — Telephone Encounter (Signed)
Medication Refill - Medication: oxyCODONE (OXY IR/ROXICODONE) 5 MG immediate release tablet   Has the patient contacted their pharmacy? Yes.    Pharmacy referred pt to PCP  Preferred Pharmacy (with phone number or street name):  [facility] [id number] [address], [place] Phone: [phone]  Fax: [phone]     Has the patient been seen for an appointment in the last year OR does the patient have an upcoming appointment? Yes.    Agent: Please be advised that RX refills may take up to 3 business days. We ask that you follow-up with your pharmacy.

## 2023-01-21 MED ORDER — OXYCODONE HCL 5 MG PO TABS: 7.5000 mg | ORAL_TABLET | Freq: Three times a day (TID) | ORAL | 0 refills | Status: DC

## 2023-01-21 NOTE — Telephone Encounter (Signed)
Requested medication (s) are due for refill today: yes  Requested medication (s) are on the active medication list: yes  Last refill:  12/19/22 #135 0 refills  Future visit scheduled: no  Notes to clinic:  not delegated per protocol. Do you want to refill Rx?     Requested Prescriptions  Pending Prescriptions Disp Refills   oxyCODONE (OXY IR/ROXICODONE) 5 MG immediate release tablet 135 tablet 0    Sig: Take 1.5 tablets (7.5 mg total) by mouth 3 (three) times daily.     Not Delegated - Analgesics:  Opioid Agonists Failed - 01/20/2023 11:50 AM      Failed - This refill cannot be delegated      Failed - Urine Drug Screen completed in last 360 days      Failed - Valid encounter within last 3 months    Recent Outpatient Visits           3 months ago Back pain with radiculopathy   [place] Birdie Sons, MD   10 months ago Type 2 diabetes mellitus with hyperlipidemia [facility])   [facility] Birdie Sons, MD   1 year ago Type 2 diabetes mellitus with hyperlipidemia [facility])   [facility] Birdie Sons, MD   1 year ago Type 2 diabetes mellitus with hyperlipidemia [facility])   [facility] Birdie Sons, MD   1 year ago Type 2 diabetes mellitus with hyperlipidemia [facility])    [facility] Caryn Section, Kirstie Peri, MD

## 2023-01-26 ENCOUNTER — Other Ambulatory Visit: Payer: Self-pay | Admitting: Family Medicine

## 2023-01-26 DIAGNOSIS — I10 Essential (primary) hypertension: Secondary | ICD-10-CM

## 2023-01-27 NOTE — Telephone Encounter (Signed)
Requested Prescriptions  Refused Prescriptions Disp Refills   amLODipine (NORVASC) 10 MG tablet [Pharmacy Med Name: AMLODIPINE BESYLATE '10MG'$  TABLETS] 90 tablet 4    Sig: TAKE 1 TABLET(10 MG) BY MOUTH DAILY     Cardiovascular: Calcium Channel Blockers 2 Failed - 01/26/2023 10:45 AM      Failed - Last BP in normal range    BP Readings from Last 1 Encounters:  10/04/22 (!) 157/97         Passed - Last Heart Rate in normal range    Pulse Readings from Last 1 Encounters:  10/04/22 89         Passed - Valid encounter within last 6 months    Recent Outpatient Visits           4 months ago Back pain with radiculopathy   [facility] Birdie [facility], [facility]   10 months ago Type 2 diabetes mellitus with hyperlipidemia [facility])   [facility] Birdie Sons, MD   1 year ago Type 2 diabetes mellitus with hyperlipidemia [facility])   [facility] Birdie Sons, MD   1 year ago Type 2 diabetes mellitus with hyperlipidemia [facility])   [facility] Birdie Sons, MD   1 year ago Type 2 diabetes mellitus with hyperlipidemia [facility])   [facility] Caryn Section, Kirstie Peri, MD

## 2023-01-28 MED ORDER — AMLODIPINE BESYLATE 10 MG PO TABS: ORAL_TABLET | ORAL | 4 refills | Status: DC

## 2023-01-28 NOTE — Addendum Note (Signed)
Addended by: Birdie Sons on: 01/28/2023 07:44 AM   Modules accepted: Orders

## 2023-02-03 ENCOUNTER — Ambulatory Visit (INDEPENDENT_AMBULATORY_CARE_PROVIDER_SITE_OTHER): Payer: Medicare Other

## 2023-02-03 VITALS — Ht 64.0 in | Wt 193.0 lb

## 2023-02-03 DIAGNOSIS — Z0000 Encounter for general adult medical examination without abnormal findings: Secondary | ICD-10-CM
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
## 2023-02-03 NOTE — Patient Instructions (Signed)
Alison Landry , Thank you for taking time to come for your Medicare Wellness Visit. I appreciate your ongoing commitment to your health goals. Please review the following plan we discussed and let me know if I can assist you in the future.   These are the goals we discussed:  Goals      DIET - EAT MORE FRUITS AND VEGETABLES     LIFESTYLE - DECREASE FALLS RISK     Recommend to remove any items from the home that may cause slips or trips.        This is a list of the screening recommended for you and due dates:  Health Maintenance  Topic Date Due   COVID-19 Vaccine (1) Never done   DTaP/Tdap/Td vaccine (1 - Tdap) Never done   Eye exam for diabetics  03/17/2022   Flu Shot  03/02/2023*   Yearly kidney health urinalysis for diabetes  03/21/2023   Hemoglobin A1C  04/01/2023   Yearly kidney function blood test for diabetes  10/05/2023   Medicare Annual Wellness Visit  02/03/2024   Pap Smear  05/29/2027   Colon Cancer Screening  03/26/2032   Hepatitis C Screening: USPSTF Recommendation to screen - Ages 18-79 yo.  Completed   HIV Screening  Completed   HPV Vaccine  Aged Out  *Topic was postponed. The date shown is not the original due date.    Advanced directives: none  Conditions/risks identified: high falls risk  Next appointment: Follow up in one year for your annual wellness visit. 02/04/2024 '@1pm'$  telephone  Preventive Care 40-64 Years, Female Preventive care refers to lifestyle choices and visits with your health care provider that can promote health and wellness. What does preventive care include? A yearly physical exam. This is also called an annual well check. Dental exams once or twice a year. Routine eye exams. Ask your health care provider how often you should have your eyes checked. Personal lifestyle choices, including: Daily care of your teeth and gums. Regular physical activity. Eating a healthy diet. Avoiding tobacco and drug use. Limiting alcohol use. Practicing  safe sex. Taking low-dose aspirin daily starting at age 25. Taking vitamin and mineral supplements as recommended by your health care provider. What happens during an annual well check? The services and screenings done by your health care provider during your annual well check will depend on your age, overall health, lifestyle risk factors, and family history of disease. Counseling  Your health care provider may ask you questions about your: Alcohol use. Tobacco use. Drug use. Emotional well-being. Home and relationship well-being. Sexual activity. Eating habits. Work and work Statistician. Method of birth control. Menstrual cycle. Pregnancy history. Screening  You may have the following tests or measurements: Height, weight, and BMI. Blood pressure. Lipid and cholesterol levels. These may be checked every 5 years, or more frequently if you are over 27 years old. Skin check. Lung cancer screening. You may have this screening every year starting at age 34 if you have a 30-pack-year history of smoking and currently smoke or have quit within the past 15 years. Fecal occult blood test (FOBT) of the stool. You may have this test every year starting at age 74. Flexible sigmoidoscopy or colonoscopy. You may have a sigmoidoscopy every 5 years or a colonoscopy every 10 years starting at age 36. Hepatitis C blood test. Hepatitis B blood test. Sexually transmitted disease (STD) testing. Diabetes screening. This is done by checking your blood sugar (glucose) after you have not eaten  for a while (fasting). You may have this done every 1-3 years. Mammogram. This may be done every 1-2 years. Talk to your health care provider about when you should start having regular mammograms. This may depend on whether you have a family history of breast cancer. BRCA-related cancer screening. This may be done if you have a family history of breast, ovarian, tubal, or peritoneal cancers. Pelvic exam and Pap test.  This may be done every 3 years starting at age 59. Starting at age 11, this may be done every 5 years if you have a Pap test in combination with an HPV test. Bone density scan. This is done to screen for osteoporosis. You may have this scan if you are at high risk for osteoporosis. Discuss your test results, treatment options, and if necessary, the need for more tests with your health care provider. Vaccines  Your health care provider may recommend certain vaccines, such as: Influenza vaccine. This is recommended every year. Tetanus, diphtheria, and acellular pertussis (Tdap, Td) vaccine. You may need a Td booster every 10 years. Zoster vaccine. You may need this after age 63. Pneumococcal 13-valent conjugate (PCV13) vaccine. You may need this if you have certain conditions and were not previously vaccinated. Pneumococcal polysaccharide (PPSV23) vaccine. You may need one or two doses if you smoke cigarettes or if you have certain conditions. Talk to your health care provider about which screenings and vaccines you need and how often you need them. This information is not intended to replace advice given to you by your health care provider. Make sure you discuss any questions you have with your health care provider. Document Released: 12/15/2015 Document Revised: 08/07/2016 Document Reviewed: 09/19/2015 Elsevier Interactive Patient Education  2017 [employer] Prevention in the Home Falls can cause injuries. They can happen to people of all ages. There are many things you can do to make your home safe and to help prevent falls. What can I do on the outside of my home? Regularly fix the edges of walkways and driveways and fix any cracks. Remove anything that might make you trip as you walk through a door, such as a raised step or threshold. Trim any bushes or trees on the path to your home. Use bright outdoor lighting. Clear any walking paths of anything that might make someone trip,  such as rocks or tools. Regularly check to see if handrails are loose or broken. Make sure that both sides of any steps have handrails. Any raised decks and porches should have guardrails on the edges. Have any leaves, snow, or ice cleared regularly. Use sand or salt on walking paths during winter. Clean up any spills in your garage right away. This includes oil or grease spills. What can I do in the bathroom? Use night lights. Install grab bars by the toilet and in the tub and shower. Do not use towel bars as grab bars. Use non-skid mats or decals in the tub or shower. If you need to sit down in the shower, use a plastic, non-slip stool. Keep the floor dry. Clean up any water that spills on the floor as soon as it happens. Remove soap buildup in the tub or shower regularly. Attach bath mats securely with double-sided non-slip rug tape. Do not have throw rugs and other things on the floor that can make you trip. What can I do in the bedroom? Use night lights. Make sure that you have a light by your bed that  is easy to reach. Do not use any sheets or blankets that are too big for your bed. They should not hang down onto the floor. Have a firm chair that has side arms. You can use this for support while you get dressed. Do not have throw rugs and other things on the floor that can make you trip. What can I do in the kitchen? Clean up any spills right away. Avoid walking on wet floors. Keep items that you use a lot in easy-to-reach places. If you need to reach something above you, use a strong step stool that has a grab bar. Keep electrical cords out of the way. Do not use floor polish or wax that makes floors slippery. If you must use wax, use non-skid floor wax. Do not have throw rugs and other things on the floor that can make you trip. What can I do with my stairs? Do not leave any items on the stairs. Make sure that there are handrails on both sides of the stairs and use them. Fix  handrails that are broken or loose. Make sure that handrails are as long as the stairways. Check any carpeting to make sure that it is firmly attached to the stairs. Fix any carpet that is loose or worn. Avoid having throw rugs at the top or bottom of the stairs. If you do have throw rugs, attach them to the floor with carpet tape. Make sure that you have a light switch at the top of the stairs and the bottom of the stairs. If you do not have them, ask someone to add them for you. What else can I do to help prevent falls? Wear shoes that: Do not have high heels. Have rubber bottoms. Are comfortable and fit you well. Are closed at the toe. Do not wear sandals. If you use a stepladder: Make sure that it is fully opened. Do not climb a closed stepladder. Make sure that both sides of the stepladder are locked into place. Ask someone to hold it for you, if possible. Clearly mark and make sure that you can see: Any grab bars or handrails. First and last steps. Where the edge of each step is. Use tools that help you move around (mobility aids) if they are needed. These include: Canes. Walkers. Scooters. Crutches. Turn on the lights when you go into a dark area. Replace any light bulbs as soon as they burn out. Set up your furniture so you have a clear path. Avoid moving your furniture around. If any of your floors are uneven, fix them. If there are any pets around you, be aware of where they are. Review your medicines with your doctor. Some medicines can make you feel dizzy. This can increase your chance of falling. Ask your doctor what other things that you can do to help prevent falls. This information is not intended to replace advice given to you by your health care provider. Make sure you discuss any questions you have with your health care provider. Document Released: 09/14/2009 Document Revised: 04/25/2016 Document Reviewed: 12/23/2014 Elsevier Interactive Patient Education  2017  [employer].

## 2023-02-12 ENCOUNTER — Other Ambulatory Visit: Payer: Self-pay | Admitting: Family Medicine

## 2023-02-17 ENCOUNTER — Other Ambulatory Visit: Payer: Self-pay | Admitting: Family Medicine

## 2023-02-17 DIAGNOSIS — G8921 Chronic pain due to trauma: Secondary | ICD-10-CM

## 2023-02-17 NOTE — Telephone Encounter (Signed)
Medication Refill - Medication: oxyCODONE (OXY IR/ROXICODONE) 5 MG immediate release tablet [id number]   Has the patient contacted their pharmacy? Yes.    (Agent: If yes, when and what did the pharmacy advise?) Contact pcp   Preferred Pharmacy (with phone number or street name): [id number] - [place], [place]   Has the patient been seen for an appointment in the last year OR does the patient have an upcoming appointment? Yes.    Agent: Please be advised that RX refills may take up to 3 business days. We ask that you follow-up with your pharmacy.   Pt will run out of medication today.

## 2023-02-18 NOTE — Telephone Encounter (Signed)
Requested medication (s) are due for refill today: yes  Requested medication (s) are on the active medication list: yes    Last refill: 01/21/23  #135   0 refills  Future visit scheduled no  Notes to clinic:Not delegated, please review. Thank you.  Requested Prescriptions  Pending Prescriptions Disp Refills   oxyCODONE (OXY IR/ROXICODONE) 5 MG immediate release tablet 135 tablet 0    Sig: Take 1.5 tablets (7.5 mg total) by mouth 3 (three) times daily.     Not Delegated - Analgesics:  Opioid Agonists Failed - 02/17/2023  1:08 PM      Failed - This refill cannot be delegated      Failed - Urine Drug Screen completed in last 360 days      Passed - Valid encounter within last 3 months    Recent Outpatient Visits           4 months ago Back pain with radiculopathy   [place] Birdie Sons, MD   11 months ago Type 2 diabetes mellitus with hyperlipidemia [facility])   [place] [facility] Birdie Sons, MD   1 year ago Type 2 diabetes mellitus with hyperlipidemia [facility])   [facility] Birdie Sons, MD   1 year ago Type 2 diabetes mellitus with hyperlipidemia [facility])   [place] [facility] Birdie Sons, MD   2 years ago Type 2 diabetes mellitus with hyperlipidemia [facility])   [place] [facility] Caryn Section, Kirstie Peri, MD

## 2023-02-19 NOTE — Telephone Encounter (Signed)
Pt is calling for an update on this refill. Pt says she is completely out of medication. Please advise.

## 2023-02-21 ENCOUNTER — Other Ambulatory Visit: Payer: Self-pay | Admitting: Family Medicine

## 2023-02-21 DIAGNOSIS — G8921 Chronic pain due to trauma: Secondary | ICD-10-CM

## 2023-02-21 MED ORDER — OXYCODONE HCL 5 MG PO TABS: 7.5000 mg | ORAL_TABLET | Freq: Three times a day (TID) | ORAL | 0 refills | Status: DC

## 2023-02-21 NOTE — Telephone Encounter (Signed)
Pt called back to check on her medication refill. Pt scheduled an appt for 03/11/23 at 8:00am. Pt is wanting to know if a short supply could be called into her pharmacy until her appt since she is out of medication.   Please advise.

## 2023-03-10 NOTE — Progress Notes (Unsigned)
Vivien Rota DeSanto,acting as a scribe for Mila Merry, MD.,have documented all relevant documentation on the behalf of Mila Merry, MD,as directed by  Mila Merry, MD while in the presence of Mila Merry, MD.     Established patient visit   Patient: Alison Landry   DOB: November 29, 1971   52 y.o. Female  MRN: [id number] Visit Date: 03/11/2023  Today's healthcare provider: Mila Merry, MD   No chief complaint on file.  Subjective    HPI  Diabetes Mellitus Type II, Follow-up  Lab Results  Component Value Date   HGBA1C 7.9 (H) 10/01/2022   HGBA1C 6.9 (H) 03/20/2022   HGBA1C 6.6 (A) 09/18/2021   Wt Readings from Last 3 Encounters:  02/03/23 193 lb (87.5 kg)  10/04/22 192 lb (87.1 kg)  09/27/22 197 lb (89.4 kg)   Last seen for diabetes 6 months ago.  Management since then includes none. She reports good compliance with treatment. She {is/is not:21021397} having side effects. {document side effects if present:1} Symptoms: {Yes/No:20286} fatigue {Yes/No:20286} foot ulcerations  {Yes/No:20286} appetite changes {Yes/No:20286} nausea  {Yes/No:20286} paresthesia of the feet  {Yes/No:20286} polydipsia  {Yes/No:20286} polyuria {Yes/No:20286} visual disturbances   {Yes/No:20286} vomiting     Home blood sugar records: {diabetes glucometry results:16657}  Episodes of hypoglycemia? {Yes/No:20286} {enter symptoms and frequency of symptoms if yes:1}   Current insulin regiment: {enter 'none' or type of insulin and number of units taken with each dose of each insulin formulation that the patient is taking:1} Most Recent Eye Exam: *** {Current exercise:16438:::1} {Current diet habits:16563:::1}  Pertinent Labs: Lab Results  Component Value Date   CHOL 198 10/01/2022   HDL 37 (L) 10/01/2022   LDLCALC 100 (H) 10/01/2022   TRIG 363 (H) 10/01/2022   CHOLHDL 5.4 (H) 10/01/2022   Lab Results  Component Value Date   NA 136 10/04/2022   K 3.8 10/04/2022   CREATININE 0.55  10/04/2022   GFRNONAA >60 10/04/2022   LABMICR 10.2 03/20/2022   MICRALBCREAT 10 03/20/2022     ---------------------------------------------------------------------------------------------------   Medications: Outpatient Medications Prior to Visit  Medication Sig   albuterol (VENTOLIN HFA) 108 (90 Base) MCG/ACT inhaler Inhale 2 puffs into the lungs every 6 (six) hours as needed for wheezing or shortness of breath.   ALPRAZolam (XANAX) 0.5 MG tablet TAKE 1 TABLET(0.5 MG) BY MOUTH DAILY AS NEEDED FOR ANXIETY   amLODipine (NORVASC) 10 MG tablet TAKE 1 TABLET(10 MG) BY MOUTH DAILY   atorvastatin (LIPITOR) 20 MG tablet Take 1 tablet (20 mg total) by mouth daily.   blood glucose meter kit and supplies KIT Dispense based on patient and insurance preference. Use up to four times daily as directed. (FOR ICD-9 250.00, 250.01).   Blood Glucose Monitoring Suppl (ONE TOUCH ULTRA 2) w/Device KIT Use to check sugar daily for type 2 diabetes E11.9   Continuous Blood Gluc Sensor (DEXCOM G6 SENSOR) MISC Place 1 Units onto the skin 4 (four) times daily.   Continuous Blood Gluc Transmit (DEXCOM G6 TRANSMITTER) MISC 1 Units by Does not apply route 4 (four) times daily. Use to check blood sugar four times daily. Replace after 90 days.   gabapentin (NEURONTIN) 600 MG tablet Take 1 tablet (600 mg total) by mouth 3 (three) times daily.   glucose blood (ONETOUCH ULTRA) test strip USE TO TEST BLOOD SUGAR THREE TIMES DAILY   ibuprofen (ADVIL) 600 MG tablet Take 1 tablet (600 mg total) by mouth every 6 (six) hours as needed.   insulin lispro (HUMALOG) 100 UNIT/ML  KwikPen INJECT 8 UNITS UNDER THE SKIN THREE TIMES DAILY   Insulin Pen Needle (BD PEN NEEDLE NANO 2ND GEN) 32G X 4 MM MISC Inject 1 each into the skin 3 (three) times daily. as directed   Lancets (ONETOUCH DELICA PLUS LANCET33G) MISC CHECK FASTING BLOOD SUGAR ONCE DAILY AS DIRECTED   metFORMIN (GLUCOPHAGE) 500 MG tablet TAKE 2 TABLETS(1000 MG) BY MOUTH TWICE  DAILY WITH A MEAL   metroNIDAZOLE (METROGEL) 0.75 % vaginal gel Place 1 Applicatorful vaginally at bedtime. Apply one applicatorful to vagina at bedtime for 5 days (Patient not taking: Reported on 02/03/2023)   mupirocin ointment (BACTROBAN) 2 % Apply 1 application. topically 2 (two) times daily.   ondansetron (ZOFRAN-ODT) 4 MG disintegrating tablet DISSOLVE 1 TABLET(4 MG) ON THE TONGUE EVERY 8 HOURS AS NEEDED FOR NAUSEA OR VOMITING   oxyCODONE (OXY IR/ROXICODONE) 5 MG immediate release tablet Take 1.5 tablets (7.5 mg total) by mouth 3 (three) times daily.   Semaglutide, 1 MG/DOSE, (OZEMPIC, 1 MG/DOSE,) 4 MG/3ML SOPN INJECT 1MG  ONCE WEEKLY AS DIRECTED   sertraline (ZOLOFT) 50 MG tablet Take 50 mg by mouth daily.   tiZANidine (ZANAFLEX) 4 MG tablet TAKE 1 TABLET BY MOUTH EVERY 4 TO 6 HOURS AS NEEDED   traMADol (ULTRAM) 50 MG tablet TAKE 1 TABLET(50 MG) BY MOUTH THREE TIMES DAILY AS NEEDED   No facility-administered medications prior to visit.    Review of Systems  {Labs  Heme  Chem  Endocrine  Serology  Results Review (optional):23779}   Objective    There were no vitals taken for this visit. {Show previous vital signs (optional):23777}  Physical Exam  ***  No results found for any visits on 03/11/23.  Assessment & Plan     ***  No follow-ups on file.      {provider attestation***:1}   Mila Merry, MD  [facility] [phone] (phone) [phone] (fax)  [facility]

## 2023-03-11 ENCOUNTER — Ambulatory Visit (INDEPENDENT_AMBULATORY_CARE_PROVIDER_SITE_OTHER): Payer: Medicare Other | Admitting: Family Medicine

## 2023-03-11 VITALS — BP 138/89 | HR 95 | Temp 98.4°F | Wt 195.0 lb

## 2023-03-11 DIAGNOSIS — E538 Deficiency of other specified B group vitamins: Secondary | ICD-10-CM | POA: Diagnosis not present

## 2023-03-11 DIAGNOSIS — E781 Pure hyperglyceridemia: Secondary | ICD-10-CM | POA: Diagnosis not present

## 2023-03-11 DIAGNOSIS — E785 Hyperlipidemia, unspecified: Secondary | ICD-10-CM

## 2023-03-11 DIAGNOSIS — I10 Essential (primary) hypertension: Secondary | ICD-10-CM

## 2023-03-11 DIAGNOSIS — E1169 Type 2 diabetes mellitus with other specified complication: Secondary | ICD-10-CM

## 2023-03-11 LAB — POCT GLYCOSYLATED HEMOGLOBIN (HGB A1C)
Est. average glucose Bld gHb Est-mCnc: 166
Hemoglobin A1C: 7.4 % — AB (ref 4.0–5.6)

## 2023-03-11 MED ORDER — DEXCOM G7 SENSOR MISC: 3 refills | Status: DC

## 2023-03-11 MED ORDER — SEMAGLUTIDE (2 MG/DOSE) 8 MG/3ML ~~LOC~~ SOPN: 2.0000 mg | PEN_INJECTOR | SUBCUTANEOUS | 3 refills | Status: DC

## 2023-03-11 MED ORDER — ROSUVASTATIN CALCIUM 10 MG PO TABS: 10.0000 mg | ORAL_TABLET | Freq: Every day | ORAL | 3 refills | Status: DC

## 2023-03-11 MED ORDER — DEXCOM G7 RECEIVER DEVI: 0 refills | Status: AC

## 2023-03-11 NOTE — Patient Instructions (Signed)
.   Please review the attached list of medications and notify my office if there are any errors.   . Please bring all of your medications to every appointment so we can make sure that our medication list is the same as yours.   

## 2023-03-12 ENCOUNTER — Other Ambulatory Visit: Payer: Self-pay | Admitting: Family Medicine

## 2023-03-12 LAB — LIPID PANEL
Chol/HDL Ratio: 6.7 ratio — ABNORMAL HIGH (ref 0.0–4.4)
Cholesterol, Total: 213 mg/dL — ABNORMAL HIGH (ref 100–199)
HDL: 32 mg/dL — ABNORMAL LOW (ref >39)
LDL Chol Calc (NIH): 99 mg/dL (ref 0–99)
Triglycerides: 486 mg/dL — ABNORMAL HIGH (ref 0–149)
VLDL Cholesterol Cal: 82 mg/dL — ABNORMAL HIGH (ref 5–40)

## 2023-03-12 LAB — VITAMIN B12: Vitamin B-12: 255 pg/mL (ref 232–1245)

## 2023-03-12 NOTE — Telephone Encounter (Signed)
Medication Refill - Medication: sertraline (ZOLOFT) 50 MG tablet   Has the patient contacted their pharmacy? Yes.     Preferred Pharmacy (with phone number or street name):  [id number] [place] Rough, [place] - [address] AT [facility] [address] Phone: [phone]  Fax: [phone]     Has the patient been seen for an appointment in the last year OR does the patient have an upcoming appointment? Yes.    Please assist patient further

## 2023-03-13 MED ORDER — SERTRALINE HCL 50 MG PO TABS: 50.0000 mg | ORAL_TABLET | Freq: Every day | ORAL | 1 refills | Status: DC

## 2023-03-13 NOTE — Telephone Encounter (Signed)
Requested medications are due for refill today.  unsure  Requested medications are on the active medications list.  yes  Last refill. unsure  Future visit scheduled.   yes  Notes to clinic.  Medication listed as historical.    Requested Prescriptions  Pending Prescriptions Disp Refills   sertraline (ZOLOFT) 50 MG tablet      Sig: Take 1 tablet (50 mg total) by mouth daily.     Psychiatry:  Antidepressants - SSRI - sertraline Failed - 03/12/2023  1:13 PM      Failed - ALT in normal range and within 360 days    ALT  Date Value Ref Range Status  10/01/2022 38 (H) 0 - 32 IU/L Final         Passed - AST in normal range and within 360 days    AST  Date Value Ref Range Status  10/01/2022 26 0 - 40 IU/L Final         Passed - Completed PHQ-2 or PHQ-9 in the last 360 days      Passed - Valid encounter within last 6 months    Recent Outpatient Visits           2 days ago Type 2 diabetes mellitus with hyperlipidemia   [facility] Malva Limes, MD   5 months ago Back pain with radiculopathy   [facility] Malva Limes, MD   11 months ago Type 2 diabetes mellitus with hyperlipidemia [facility])   [place] [facility] Malva Limes, MD   1 year ago Type 2 diabetes mellitus with hyperlipidemia [facility])   [place] [facility] Malva Limes, MD   1 year ago Type 2 diabetes mellitus with hyperlipidemia [facility])    [facility] Malva Limes, MD       Future Appointments             In 3 months Fisher, Demetrios Isaacs, MD [facility]

## 2023-03-17 ENCOUNTER — Other Ambulatory Visit: Payer: Self-pay | Admitting: Family Medicine

## 2023-03-21 ENCOUNTER — Other Ambulatory Visit: Payer: Self-pay | Admitting: Family Medicine

## 2023-03-21 DIAGNOSIS — G8921 Chronic pain due to trauma: Secondary | ICD-10-CM

## 2023-03-25 ENCOUNTER — Other Ambulatory Visit: Payer: Self-pay | Admitting: Family Medicine

## 2023-03-25 DIAGNOSIS — G8921 Chronic pain due to trauma: Secondary | ICD-10-CM

## 2023-03-25 NOTE — Telephone Encounter (Unsigned)
Copied from CRM [id number]. Topic: General - Other >> Mar 25, 2023 11:03 AM Alison Landry wrote: Reason for CRM: Medication Refill - Medication: oxyCODONE (OXY IR/ROXICODONE) 5 MG immediate release tablet [[id number]]  Has the patient contacted their pharmacy? Yes.   (Agent: If no, request that the patient contact the pharmacy for the refill. If patient does not wish to contact the pharmacy document the reason why and proceed with request.) (Agent: If yes, when and what did the pharmacy advise?)  Preferred Pharmacy (with phone number or street name): [facility] #[id number] Nicholes Rough, [place] - [address] AT [facility] OF [address] [place] [address] Phone: [phone] Fax: [phone] Hours: Not open 24 hours   Has the patient been seen for an appointment in the last year OR does the patient have an upcoming appointment? Yes.    Agent: Please be advised that RX refills may take up to 3 business days. We ask that you follow-up with your pharmacy.

## 2023-03-25 NOTE — Telephone Encounter (Signed)
Requested medications are due for refill today.  unsure  Requested medications are on the active medications list.  yes  Last refill. 02/21/2023 #135 0 rf  Future visit scheduled.   yes  Notes to clinic.  Refill not delegated.    Requested Prescriptions  Pending Prescriptions Disp Refills   oxyCODONE (OXY IR/ROXICODONE) 5 MG immediate release tablet 135 tablet 0    Sig: Take 1.5 tablets (7.5 mg total) by mouth 3 (three) times daily.     Not Delegated - Analgesics:  Opioid Agonists Failed - 03/25/2023 11:23 AM      Failed - This refill cannot be delegated      Failed - Urine Drug Screen completed in last 360 days      Passed - Valid encounter within last 3 months    Recent Outpatient Visits           2 weeks ago Type 2 diabetes mellitus with hyperlipidemia   [facility] Malva Limes, MD   5 months ago Back pain with radiculopathy   [facility] Malva Limes, MD   1 year ago Type 2 diabetes mellitus with hyperlipidemia [facility])   [place] [facility] Malva Limes, MD   1 year ago Type 2 diabetes mellitus with hyperlipidemia [facility])   [place] [facility] Malva Limes, MD   1 year ago Type 2 diabetes mellitus with hyperlipidemia [facility])   [place] [facility] Malva Limes, MD       Future Appointments             In 3 months Fisher, Demetrios Isaacs, MD [facility]

## 2023-03-26 MED ORDER — OXYCODONE HCL 5 MG PO TABS: 7.5000 mg | ORAL_TABLET | Freq: Three times a day (TID) | ORAL | 0 refills | Status: DC

## 2023-03-26 NOTE — Telephone Encounter (Signed)
See Rx request ° °

## 2023-03-28 ENCOUNTER — Other Ambulatory Visit: Payer: Self-pay | Admitting: Family Medicine

## 2023-03-28 NOTE — Telephone Encounter (Signed)
Requested medication (s) are due for refill today expired Rx  Requested medication (s) are on the active medication list -yes  Future visit scheduled -yes  Last refill: 03/19/21 15ml 4RF  Notes to clinic: expired Rx  Requested Prescriptions  Pending Prescriptions Disp Refills   insulin lispro (HUMALOG) 100 UNIT/ML KwikPen 15 mL 4    Sig: INJECT 8 UNITS UNDER THE SKIN THREE TIMES DAILY     Endocrinology:  Diabetes - Insulins Passed - 03/28/2023  2:02 PM      Passed - HBA1C is between 0 and 7.9 and within 180 days    Hemoglobin A1C  Date Value Ref Range Status  03/11/2023 7.4 (A) 4.0 - 5.6 % Final  06/17/2014 5.5  Final   Hgb A1c MFr Bld  Date Value Ref Range Status  10/01/2022 7.9 (H) 4.8 - 5.6 % Final    Comment:             Prediabetes: 5.7 - 6.4          Diabetes: >6.4          Glycemic control for adults with diabetes: <7.0          Passed - Valid encounter within last 6 months    Recent Outpatient Visits           2 weeks ago Type 2 diabetes mellitus with hyperlipidemia (HCC)   [place] [facility] Malva Limes, [facility]   6 months ago Back pain with radiculopathy   [place] [facility]va Limes, MD   1 year ago Type 2 diabetes mellitus with hyperlipidemia [facility])   [place] [facility], MD   1 year ago Type 2 diabetes mellitus with hyperlipidemia [facility])   [place] [facility]va Limes, MD   1 year ago Type 2 diabetes mellitus with hyperlipidemia (HCC)   [place] [facility] Malva Limes, MD       Future Appointments             In 2 months Fisher, Demetrios Isaacs, MD [place]               Requested Prescriptions  Pending Prescriptions Disp Refills   insulin lispro (HUMALOG) 100 UNIT/ML KwikPen 15 mL 4    Sig: INJECT 8 UNITS UNDER THE SKIN THREE TIMES DAILY     Endocrinology:  Diabetes - Insulins  Passed - 03/28/2023  2:02 PM      Passed - HBA1C is between 0 and 7.9 and within 180 days    Hemoglobin A1C  Date Value Ref Range Status  03/11/2023 7.4 (A) 4.0 - 5.6 % Final  06/17/2014 5.5  Final   Hgb A1c MFr Bld  Date Value Ref Range Status  10/01/2022 7.9 (H) 4.8 - 5.6 % Final    Comment:             Prediabetes: 5.7 - 6.4          Diabetes: >6.4          Glycemic control for adults with diabetes: <7.0          Passed - Valid encounter within last 6 months    Recent Outpatient Visits           2 weeks ago Type 2 diabetes mellitus with hyperlipidemia [facility])   [place] [facility] Malva Limes, MD   6 months  ago Back pain with radiculopathy   [facility], MD   1 year ago Type 2 diabetes mellitus with hyperlipidemia [facility])   [place] [facility] Malva Limes, MD   1 year ago Type 2 diabetes mellitus with hyperlipidemia [facility])   [place] [facility] Malva Limes, MD   1 year ago Type 2 diabetes mellitus with hyperlipidemia [facility])   [place] [facility] Malva Limes, MD       Future Appointments             In 2 months Fisher, Demetrios Isaacs, MD [facility]

## 2023-03-28 NOTE — Telephone Encounter (Signed)
Medication Refill - Medication: insulin lispro (HUMALOG) 100 UNIT/ML KwikPen   Has the patient contacted their pharmacy? yes (Agent: If no, request that the patient contact the pharmacy for the refill. If patient does not wish to contact the pharmacy document the reason why and proceed with request.) (Agent: If yes, when and what did the pharmacy advise?)contact pcp  Preferred Pharmacy (with phone number or street name):  [facility][id number] Nicholes Rough, [place] - [address] AT [facility] [address] Phone: [phone]  Fax: [phone]     Has the patient been seen for an appointment in the last year OR does the patient have an upcoming appointment? yes  Agent: Please be advised that RX refills may take up to 3 business days. We ask that you follow-up with your pharmacy.

## 2023-03-29 MED ORDER — INSULIN LISPRO (1 UNIT DIAL) 100 UNIT/ML (KWIKPEN): PEN_INJECTOR | SUBCUTANEOUS | 4 refills | Status: DC

## 2023-04-22 ENCOUNTER — Other Ambulatory Visit: Payer: Self-pay | Admitting: Family Medicine

## 2023-04-22 DIAGNOSIS — G8921 Chronic pain due to trauma: Secondary | ICD-10-CM

## 2023-04-22 MED ORDER — OXYCODONE HCL 5 MG PO TABS: 7.5000 mg | ORAL_TABLET | Freq: Three times a day (TID) | ORAL | 0 refills | Status: DC

## 2023-04-22 NOTE — Telephone Encounter (Signed)
Copied from CRM [id number]. Topic: General - Other >> Apr 22, 2023  8:46 AM Everette C wrote: Reason for CRM: Medication Refill - Medication: oxyCODONE (OXY IR/ROXICODONE) 5 MG immediate release tablet [[id number]]  Has the patient contacted their pharmacy? No.   (Agent: If no, request that the patient contact the pharmacy for the refill. If patient does not wish to contact the pharmacy document the reason why and proceed with request.) (Agent: If yes, when and what did the pharmacy advise?)  Preferred Pharmacy (with phone number or street name): [facility] #[id number] Nicholes Rough, [place] - [address] AT [facility] OF [address] & [address] [place] [address] Phone: [phone] Fax: [phone] Hours: Not open 24 hours   Has the patient been seen for an appointment in the last year OR does the patient have an upcoming appointment? Yes.    Agent: Please be advised that RX refills may take up to 3 business days. We ask that you follow-up with your pharmacy.

## 2023-04-22 NOTE — Telephone Encounter (Signed)
Requested medication (s) are due for refill today - yes  Requested medication (s) are on the active medication list -yes  Future visit scheduled -yes  Last refill: 03/26/23 #135  Notes to clinic: non delegated Rx  Requested Prescriptions  Pending Prescriptions Disp Refills   oxyCODONE (OXY IR/ROXICODONE) 5 MG immediate release tablet 135 tablet 0    Sig: Take 1.5 tablets (7.5 mg total) by mouth 3 (three) times daily.     Not Delegated - Analgesics:  Opioid Agonists Failed - 04/22/2023  8:54 AM      Failed - This refill cannot be delegated      Failed - Urine Drug Screen completed in last 360 days      Passed - Valid encounter within last 3 months    Recent Outpatient Visits           1 month ago Type 2 diabetes mellitus with hyperlipidemia (HCC)   [facility], MD   6 months ago Back pain with radiculopathy   [facility] Mal[facility] Limes, MD   1 year ago Type 2 diabetes mellitus with hyperlipidemia [facility])   [place] [facility] Malva Limes, MD   1 year ago Type 2 diabetes mellitus with hyperlipidemia [facility])   [place] [facility] Malva Limes, MD   1 year ago Type 2 diabetes mellitus with hyperlipidemia [facility])   [place] [facility] Malva Limes, MD       Future Appointments             In 2 months Fisher, Demetrios Isaacs, MD [facility]               Requested Prescriptions  Pending Prescriptions Disp Refills   oxyCODONE (OXY IR/ROXICODONE) 5 MG immediate release tablet 135 tablet 0    Sig: Take 1.5 tablets (7.5 mg total) by mouth 3 (three) times daily.     Not Delegated - Analgesics:  Opioid Agonists Failed - 04/22/2023  8:54 AM      Failed - This refill cannot be delegated      Failed - Urine Drug Screen completed in last 360 days      Passed - Valid encounter within last 3 months    Recent  Outpatient Visits           1 month ago Type 2 diabetes mellitus with hyperlipidemia (HCC)   [facility] Mal[facility] L[facility], MD   6 months ago Back pain with radiculopathy   [place] [facility] Malva Limes, MD   1 year ago Type 2 diabetes mellitus with hyperlipidemia [facility])   [place] [facility] Malva Limes, MD   1 year ago Type 2 diabetes mellitus with hyperlipidemia [facility])   [place] [facility] Malva Limes, MD   1 year ago Type 2 diabetes mellitus with hyperlipidemia [facility])   [place] [facility] Malva Limes, MD       Future Appointments             In 2 months Fisher, Demetrios Isaacs, MD [facility]

## 2023-04-27 ENCOUNTER — Other Ambulatory Visit: Payer: Self-pay | Admitting: Family Medicine

## 2023-04-27 DIAGNOSIS — E119 Type 2 diabetes mellitus without complications: Secondary | ICD-10-CM

## 2023-05-22 ENCOUNTER — Other Ambulatory Visit: Payer: Self-pay | Admitting: Family Medicine

## 2023-05-22 DIAGNOSIS — G8921 Chronic pain due to trauma: Secondary | ICD-10-CM

## 2023-05-22 MED ORDER — OXYCODONE HCL 5 MG PO TABS: 7.5000 mg | ORAL_TABLET | Freq: Three times a day (TID) | ORAL | 0 refills | Status: DC

## 2023-05-22 NOTE — Telephone Encounter (Signed)
Requested medication (s) are due for refill today: routing for review  Requested medication (s) are on the active medication list: yes  Last refill:  04/22/23  Future visit scheduled: yes  Notes to clinic:  Unable to refill per protocol, cannot delegate.      Requested Prescriptions  Pending Prescriptions Disp Refills   oxyCODONE (OXY IR/ROXICODONE) 5 MG immediate release tablet 135 tablet 0    Sig: Take 1.5 tablets (7.5 mg total) by mouth 3 (three) times daily.     Not Delegated - Analgesics:  Opioid Agonists Failed - 05/22/2023  9:53 AM      Failed - This refill cannot be delegated      Failed - Urine Drug Screen completed in last 360 days      Passed - Valid encounter within last 3 months    Recent Outpatient Visits           2 months ago Type 2 diabetes mellitus with hyperlipidemia (HCC)   [facility] Limes, MD   7 months ago Back pain with radiculopathy   [facility]   1 year ago Type 2 diabetes mellitus with hyperlipidemia [facility])   [place] [facility] Mal[facility] Limes, MD   1 year ago Type 2 diabetes mellitus with hyperlipidemia [facility])   [place] [facility] Malva Limes, MD   2 years ago Type 2 diabetes mellitus with hyperlipidemia [facility])   [place] [facility] Malva Limes, MD       Future Appointments             In 1 month Fisher, Demetrios Isaacs, MD [facility]

## 2023-05-22 NOTE — Telephone Encounter (Signed)
Medication Refill - Medication: oxyCODONE (OXY IR/ROXICODONE) 5 MG immediate release tablet   Has the patient contacted their pharmacy? Yes.     Preferred Pharmacy (with phone number or street name):  [facility] #[id number] Nicholes Rough, [place] - [address] AT [facility] [address] Phone: [phone]  Fax: [phone]      Has the patient been seen for an appointment in the last year OR does the patient have an upcoming appointment? Yes.    Please assist patient further. The patient states she has about 2 days worth left

## 2023-05-27 ENCOUNTER — Other Ambulatory Visit: Payer: Self-pay | Admitting: Family Medicine

## 2023-05-27 DIAGNOSIS — K2100 Gastro-esophageal reflux disease with esophagitis, without bleeding: Secondary | ICD-10-CM

## 2023-05-28 NOTE — Telephone Encounter (Signed)
Requested medication (s) are due for refill today: yes  Requested medication (s) are on the active medication list: yes    Last refill: 06/21/22  #20  3 refills  Future visit scheduled yes 06/23/23  Notes to clinic  Not delegated, please review. Thank you. Requested Prescriptions  Pending Prescriptions Disp Refills   ondansetron (ZOFRAN-ODT) 4 MG disintegrating tablet [Pharmacy Med Name: ONDANSETRON ODT 4MG  TABLETS] 20 tablet 3    Sig: DISSOLVE 1 TABLET(4 MG) ON THE TONGUE EVERY 8 HOURS AS NEEDED FOR NAUSEA OR VOMITING     Not Delegated - Gastroenterology: Antiemetics - ondansetron Failed - 05/27/2023  9:24 PM      Failed - This refill cannot be delegated      Failed - ALT in normal range and within 360 days    ALT  Date Value Ref Range Status  10/01/2022 38 (H) 0 - 32 IU/L Final         Passed - AST in normal range and within 360 days    AST  Date Value Ref Range Status  10/01/2022 26 0 - 40 IU/L Final         Passed - Valid encounter within last 6 months    Recent Outpatient Visits           2 months ago Type 2 diabetes mellitus with hyperlipidemia (HCC)   [facility], [facility]   8 months ago Back pain with radiculopathy   [facility] MD   1 year ago Type 2 diabetes mellitus with hyperlipidemia [facility])   [place] [facility], MD   1 year ago Type 2 diabetes mellitus with hyperlipidemia [facility])   [place] [facility] Malva Limes, MD   2 years ago Type 2 diabetes mellitus with hyperlipidemia [facility])   [place] [facility] Malva Limes, MD       Future Appointments             In 3 weeks Fisher, Demetrios Isaacs, MD [facility]

## 2023-06-09 ENCOUNTER — Other Ambulatory Visit: Payer: Self-pay | Admitting: Family Medicine

## 2023-06-09 DIAGNOSIS — M25541 Pain in joints of right hand: Secondary | ICD-10-CM

## 2023-06-20 ENCOUNTER — Other Ambulatory Visit: Payer: Self-pay | Admitting: Family Medicine

## 2023-06-20 NOTE — Telephone Encounter (Signed)
Requested medications are due for refill today.  yes  Requested medications are on the active medications list.  yes  Last refill. 03/18/2023 #30 2 rf  Future visit scheduled.   yes  Notes to clinic.  Refill not delegated.    Requested Prescriptions  Pending Prescriptions Disp Refills   ALPRAZolam (XANAX) 0.5 MG tablet [Pharmacy Med Name: ALPRAZOLAM 0.5MG  TABLETS] 30 tablet     Sig: TAKE 1 TABLET(0.5 MG) BY MOUTH DAILY AS NEEDED FOR ANXIETY     Not Delegated - Psychiatry: Anxiolytics/Hypnotics 2 Failed - 06/20/2023 12:27 PM      Failed - This refill cannot be delegated      Failed - Urine Drug Screen completed in last 360 days      Passed - Patient is not pregnant      Passed - Valid encounter within last 6 months    Recent Outpatient Visits           3 months ago Type 2 diabetes mellitus with hyperlipidemia (HCC)   [place] [facility] Malva Limes, MD   8 months ago Back pain with radiculopathy   [facility], MD   1 year ago Type 2 diabetes mellitus with hyperlipidemia [facility])   [place] [facility] Malva Limes, MD   1 year ago Type 2 diabetes mellitus with hyperlipidemia [facility])   [place] [facility] Malva Limes, MD   2 years ago Type 2 diabetes mellitus with hyperlipidemia [facility])   [place] [facility] Malva Limes, MD       Future Appointments             In 3 days Fisher, Demetrios Isaacs, MD [facility]

## 2023-06-23 ENCOUNTER — Ambulatory Visit (INDEPENDENT_AMBULATORY_CARE_PROVIDER_SITE_OTHER): Payer: Medicare Other | Admitting: Family Medicine

## 2023-06-23 ENCOUNTER — Encounter: Payer: Self-pay | Admitting: Family Medicine

## 2023-06-23 VITALS — BP 139/73 | HR 95 | Temp 97.9°F | Resp 12 | Ht 64.0 in | Wt 186.4 lb

## 2023-06-23 DIAGNOSIS — E785 Hyperlipidemia, unspecified: Secondary | ICD-10-CM

## 2023-06-23 DIAGNOSIS — Z87828 Personal history of other (healed) physical injury and trauma: Secondary | ICD-10-CM

## 2023-06-23 DIAGNOSIS — G8921 Chronic pain due to trauma: Secondary | ICD-10-CM

## 2023-06-23 DIAGNOSIS — Z6835 Body mass index (BMI) 35.0-35.9, adult: Secondary | ICD-10-CM

## 2023-06-23 DIAGNOSIS — E1169 Type 2 diabetes mellitus with other specified complication: Secondary | ICD-10-CM

## 2023-06-23 DIAGNOSIS — I10 Essential (primary) hypertension: Secondary | ICD-10-CM | POA: Diagnosis not present

## 2023-06-23 DIAGNOSIS — E538 Deficiency of other specified B group vitamins: Secondary | ICD-10-CM | POA: Diagnosis not present

## 2023-06-23 DIAGNOSIS — E781 Pure hyperglyceridemia: Secondary | ICD-10-CM

## 2023-06-23 DIAGNOSIS — F1190 Opioid use, unspecified, uncomplicated: Secondary | ICD-10-CM

## 2023-06-23 LAB — POCT GLYCOSYLATED HEMOGLOBIN (HGB A1C)
Est. average glucose Bld gHb Est-mCnc: 166
Hemoglobin A1C: 7.4 % — AB (ref 4.0–5.6)

## 2023-06-23 MED ORDER — METFORMIN HCL ER 500 MG PO TB24: 1000.0000 mg | ORAL_TABLET | Freq: Two times a day (BID) | ORAL | 1 refills | Status: DC

## 2023-06-23 MED ORDER — OXYCODONE HCL 5 MG PO TABS: 7.5000 mg | ORAL_TABLET | Freq: Three times a day (TID) | ORAL | 0 refills | Status: DC

## 2023-06-23 NOTE — Patient Instructions (Signed)
.   Please review the attached list of medications and notify my office if there are any errors.   . Please bring all of your medications to every appointment so we can make sure that our medication list is the same as yours.   
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
## 2023-07-02 DIAGNOSIS — E781 Pure hyperglyceridemia: Secondary | ICD-10-CM | POA: Diagnosis not present

## 2023-07-02 DIAGNOSIS — E785 Hyperlipidemia, unspecified: Secondary | ICD-10-CM | POA: Diagnosis not present

## 2023-07-02 DIAGNOSIS — E1169 Type 2 diabetes mellitus with other specified complication: Secondary | ICD-10-CM | POA: Diagnosis not present

## 2023-07-06 ENCOUNTER — Encounter: Payer: Self-pay | Admitting: Family Medicine

## 2023-07-06 DIAGNOSIS — E559 Vitamin D deficiency, unspecified: Secondary | ICD-10-CM | POA: Insufficient documentation

## 2023-07-22 ENCOUNTER — Other Ambulatory Visit: Payer: Self-pay | Admitting: Family Medicine

## 2023-07-22 DIAGNOSIS — G8921 Chronic pain due to trauma: Secondary | ICD-10-CM

## 2023-07-22 DIAGNOSIS — E1169 Type 2 diabetes mellitus with other specified complication: Secondary | ICD-10-CM

## 2023-07-22 NOTE — Telephone Encounter (Unsigned)
Copied from CRM [id number]. Topic: General - Other >> Jul 22, 2023  8:33 AM Everette C wrote: Reason for CRM: Medication Refill - Medication: oxyCODONE (OXY IR/ROXICODONE) 5 MG immediate release tablet [[id number]]  insulin lispro (HUMALOG) 100 UNIT/ML KwikPen [[id number]]  Has the patient contacted their pharmacy? Yes.   (Agent: If no, request that the patient contact the pharmacy for the refill. If patient does not wish to contact the pharmacy document the reason why and proceed with request.) (Agent: If yes, when and what did the pharmacy advise?)  Preferred Pharmacy (with phone number or street name): [facility] #[id number] Nicholes Rough, [place] - [address] AT [facility] [address] [place] [address] Phone: [phone] Fax: [phone] Hours: Not open 24 hours   Has the patient been seen for an appointment in the last year OR does the patient have an upcoming appointment? Yes.    Agent: Please be advised that RX refills may take up to 3 business days. We ask that you follow-up with your pharmacy.

## 2023-07-23 MED ORDER — INSULIN LISPRO (1 UNIT DIAL) 100 UNIT/ML (KWIKPEN): PEN_INJECTOR | SUBCUTANEOUS | 1 refills | Status: DC

## 2023-07-23 NOTE — Telephone Encounter (Signed)
Requested medication (s) are due for refill today: Yes  Requested medication (s) are on the active medication list: Yes  Last refill:  06/23/23  Future visit scheduled: No  Notes to clinic:  Unable to refill per protocol, cannot delegate.      Requested Prescriptions  Pending Prescriptions Disp Refills   oxyCODONE (OXY IR/ROXICODONE) 5 MG immediate release tablet 135 tablet 0    Sig: Take 1.5 tablets (7.5 mg total) by mouth 3 (three) times daily.     Not Delegated - Analgesics:  Opioid Agonists Failed - 07/22/2023  9:49 AM      Failed - This refill cannot be delegated      Failed - Urine Drug Screen completed in last 360 days      Passed - Valid encounter within last 3 months    Recent Outpatient Visits           1 month ago Type 2 diabetes mellitus with hyperlipidemia (HCC)   [place] [facility] Malva Limes, MD   4 months ago Type 2 diabetes mellitus with hyperlipidemia [facility])   [facility], MD   9 months ago Back pain with radiculopathy   [place] [facility] Mal[facility] L[facility], MD   1 year ago Type 2 diabetes mellitus with hyperlipidemia (HCC)   [place] [facility] Mal[facility] Limes, MD   1 year ago Type 2 diabetes mellitus with hyperlipidemia (HCC)   [place] [facility] Malva Limes, MD              Signed Prescriptions Disp Refills   insulin lispro (HUMALOG) 100 UNIT/ML KwikPen 15 mL 1    Sig: INJECT 8 UNITS UNDER THE SKIN THREE TIMES DAILY     Endocrinology:  Diabetes - Insulins Passed - 07/22/2023  9:49 AM      Passed - HBA1C is between 0 and 7.9 and within 180 days    Hemoglobin A1C  Date Value Ref Range Status  06/23/2023 7.4 (A) 4.0 - 5.6 % Final  06/17/2014 5.5  Final   Hgb A1c MFr Bld  Date Value Ref Range Status  10/01/2022 7.9 (H) 4.8 - 5.6 % Final    Comment:             Prediabetes: 5.7 - 6.4          Diabetes: >6.4           Glycemic control for adults with diabetes: <7.0          Passed - Valid encounter within last 6 months    Recent Outpatient Visits           1 month ago Type 2 diabetes mellitus with hyperlipidemia (HCC)   [place] [facility], [facility]   4 months ago Type 2 diabetes mellitus with hyperlipidemia [facility])   [place] [facility], MD   9 months ago Back pain with radiculopathy   [facility]   1 year ago Type 2 diabetes mellitus with hyperlipidemia [facility])   [place] [facility] Limes, MD   1 year ago Type 2 diabetes mellitus with hyperlipidemia [facility])   [place] [facility] Sherrie Mustache, Demetrios Isaacs, MD

## 2023-07-23 NOTE — Telephone Encounter (Signed)
Requested Prescriptions  Pending Prescriptions Disp Refills   oxyCODONE (OXY IR/ROXICODONE) 5 MG immediate release tablet 135 tablet 0    Sig: Take 1.5 tablets (7.5 mg total) by mouth 3 (three) times daily.     Not Delegated - Analgesics:  Opioid Agonists Failed - 07/22/2023  9:49 AM      Failed - This refill cannot be delegated      Failed - Urine Drug Screen completed in last 360 days      Passed - Valid encounter within last 3 months    Recent Outpatient Visits           1 month ago Type 2 diabetes mellitus with hyperlipidemia (HCC)   [place] [facility] Mal[facility] L[facility], [facility]   4 months ago Type 2 diabetes mellitus with hyperlipidemia [facility])   [place] [facility] Mal[facility] Limes, MD   9 months ago Back pain with radiculopathy   [facility] Mal[facility] Limes, MD   1 year ago Type 2 diabetes mellitus with hyperlipidemia (HCC)   [place] [facility] Malva Limes, MD   1 year ago Type 2 diabetes mellitus with hyperlipidemia (HCC)   [place] [facility] Malva Limes, MD               insulin lispro (HUMALOG) 100 UNIT/ML KwikPen 15 mL 1    Sig: INJECT 8 UNITS UNDER THE SKIN THREE TIMES DAILY     Endocrinology:  Diabetes - Insulins Passed - 07/22/2023  9:49 AM      Passed - HBA1C is between 0 and 7.9 and within 180 days    Hemoglobin A1C  Date Value Ref Range Status  06/23/2023 7.4 (A) 4.0 - 5.6 % Final  06/17/2014 5.5  Final   Hgb A1c MFr Bld  Date Value Ref Range Status  10/01/2022 7.9 (H) 4.8 - 5.6 % Final    Comment:             Prediabetes: 5.7 - 6.4          Diabetes: >6.4          Glycemic control for adults with diabetes: <7.0          Passed - Valid encounter within last 6 months    Recent Outpatient Visits           1 month ago Type 2 diabetes mellitus with hyperlipidemia (HCC)   [place] [facility], MD   4 months  ago Type 2 diabetes mellitus with hyperlipidemia [facility])   [place] [facility] L[facility], MD   9 months ago Back pain with radiculopathy   [facility], MD   1 year ago Type 2 diabetes mellitus with hyperlipidemia [facility])   [facility] Mal[facility] Limes, MD   1 year ago Type 2 diabetes mellitus with hyperlipidemia [facility])   [facility] Sherrie Mustache, Demetrios Isaacs, MD

## 2023-07-24 MED ORDER — OXYCODONE HCL 5 MG PO TABS: 7.5000 mg | ORAL_TABLET | Freq: Three times a day (TID) | ORAL | 0 refills | Status: DC

## 2023-08-22 ENCOUNTER — Other Ambulatory Visit: Payer: Self-pay | Admitting: Family Medicine

## 2023-08-22 DIAGNOSIS — G8921 Chronic pain due to trauma: Secondary | ICD-10-CM

## 2023-08-22 NOTE — Telephone Encounter (Signed)
Medication Refill - Medication: Insulin Pen Needle (BD PEN NEEDLE NANO 2ND GEN) 32G X 4 MM MISC /oxyCODONE (OXY IR/ROXICODONE) 5 MG immediate release tablet   Has the patient contacted their pharmacy? No because of this type of med, the Oxy   Preferred Pharmacy (with phone number or street name): [id number] [place], [place] - [address] AT [facility] [address]  Phone: [phone] Fax: [phone] Has the patient been seen for an appointment in the last year OR does the patient have an upcoming appointment? yes  Agent: Please be advised that RX refills may take up to 3 business days. We ask that you follow-up with your pharmacy.

## 2023-08-24 ENCOUNTER — Other Ambulatory Visit: Payer: Self-pay | Admitting: Family Medicine

## 2023-08-25 MED ORDER — BD PEN NEEDLE NANO 2ND GEN 32G X 4 MM MISC: 1.0000 | Freq: Three times a day (TID) | 1 refills | Status: DC

## 2023-08-25 NOTE — Telephone Encounter (Signed)
Requested medications are due for refill today.  yes  Requested medications are on the active medications list.  yes  Last refill. 07/24/2023 #135 0 rf  Future visit scheduled.   no  Notes to clinic.  Refill not delegated.    Requested Prescriptions  Pending Prescriptions Disp Refills   oxyCODONE (OXY IR/ROXICODONE) 5 MG immediate release tablet 135 tablet 0    Sig: Take 1.5 tablets (7.5 mg total) by mouth 3 (three) times daily.     Not Delegated - Analgesics:  Opioid Agonists Failed - 08/22/2023 10:14 AM      Failed - This refill cannot be delegated      Failed - Urine Drug Screen completed in last 360 days      Passed - Valid encounter within last 3 months    Recent Outpatient Visits           2 months ago Type 2 diabetes mellitus with hyperlipidemia (HCC)   [place] [facility]imes, MD   5 months ago Type 2 diabetes mellitus with hyperlipidemia [facility])   [place] [facility], MD   11 months ago Back pain with radiculopathy   [facility], MD   1 year ago Type 2 diabetes mellitus with hyperlipidemia [facility])   [place] [facility] Mal[facility] L[facility], MD   1 year ago Type 2 diabetes mellitus with hyperlipidemia (HCC)   [place] [facility] Malva Limes, MD              Signed Prescriptions Disp Refills   Insulin Pen Needle (BD PEN NEEDLE NANO 2ND GEN) 32G X 4 MM MISC 300 each 1    Sig: Inject 1 each into the skin 3 (three) times daily. as directed     Endocrinology: Diabetes - Testing Supplies Passed - 08/22/2023 10:14 AM      Passed - Valid encounter within last 12 months    Recent Outpatient Visits           2 months ago Type 2 diabetes mellitus with hyperlipidemia [facility])   [place] [facility] Mal[facility], MD   5 months ago Type 2 diabetes mellitus with hyperlipidemia [facility])   [place] [facility] Malva Limes, MD   11 months ago Back pain with radiculopathy   [facility]   1 year ago Type 2 diabetes mellitus with hyperlipidemia [place] [facility])   [place] [facility] Mal[facility] Limes, MD   1 year ago Type 2 diabetes mellitus with hyperlipidemia [facility])   [place] [facility] Malva Limes, MD

## 2023-08-25 NOTE — Telephone Encounter (Signed)
Requested Prescriptions  Pending Prescriptions Disp Refills   Insulin Pen Needle (BD PEN NEEDLE NANO 2ND GEN) 32G X 4 MM MISC 300 each 1    Sig: Inject 1 each into the skin 3 (three) times daily. as directed     Endocrinology: Diabetes - Testing Supplies Passed - 08/22/2023 10:14 AM      Passed - Valid encounter within last 12 months    Recent Outpatient Visits           2 months ago Type 2 diabetes mellitus with hyperlipidemia (HCC)   [place] [facility] L[facility], MD   5 months ago Type 2 diabetes mellitus with hyperlipidemia [facility])   [place] [facility] Mal[facility] Limes, MD   11 months ago Back pain with radiculopathy   [place] [facility] Malva Limes, MD   1 year ago Type 2 diabetes mellitus with hyperlipidemia [facility])   [place] [facility] Mal[facility] Limes, MD   1 year ago Type 2 diabetes mellitus with hyperlipidemia [facility])   [place] [facility] Malva Limes, MD               oxyCODONE (OXY IR/ROXICODONE) 5 MG immediate release tablet 135 tablet 0    Sig: Take 1.5 tablets (7.5 mg total) by mouth 3 (three) times daily.     Not Delegated - Analgesics:  Opioid Agonists Failed - 08/22/2023 10:14 AM      Failed - This refill cannot be delegated      Failed - Urine Drug Screen completed in last 360 days      Passed - Valid encounter within last 3 months    Recent Outpatient Visits           2 months ago Type 2 diabetes mellitus with hyperlipidemia (HCC)   [place] [facility] Mal[facility] L[facility], MD   5 months ago Type 2 diabetes mellitus with hyperlipidemia [facility])   [place] [facility] Mal[facility] L[facility], MD   11 months ago Back pain with radiculopathy   [facility], MD   1 year ago Type 2 diabetes mellitus with hyperlipidemia [facility])   [place] [facility] Malva Limes,  MD   1 year ago Type 2 diabetes mellitus with hyperlipidemia [facility])   [place] [facility] Sherrie Mustache, Demetrios Isaacs, MD

## 2023-08-26 MED ORDER — OXYCODONE HCL 5 MG PO TABS: 7.5000 mg | ORAL_TABLET | Freq: Three times a day (TID) | ORAL | 0 refills | Status: DC

## 2023-08-26 NOTE — Telephone Encounter (Signed)
Requested Prescriptions  Pending Prescriptions Disp Refills   sertraline (ZOLOFT) 50 MG tablet [Pharmacy Med Name: SERTRALINE 50MG  TABLETS] 90 tablet 1    Sig: TAKE 1 TABLET(50 MG) BY MOUTH DAILY     Psychiatry:  Antidepressants - SSRI - sertraline Failed - 08/24/2023  4:07 PM      Failed - ALT in normal range and within 360 days    ALT  Date Value Ref Range Status  10/01/2022 38 (H) 0 - 32 IU/L Final         Passed - AST in normal range and within 360 days    AST  Date Value Ref Range Status  10/01/2022 26 0 - 40 IU/L Final         Passed - Completed PHQ-2 or PHQ-9 in the last 360 days      Passed - Valid encounter within last 6 months    Recent Outpatient Visits           2 months ago Type 2 diabetes mellitus with hyperlipidemia (HCC)   [place] [facility], MD   5 months ago Type 2 diabetes mellitus with hyperlipidemia [facility])   [place] [facility] Mal[facility] Limes, MD   11 months ago Back pain with radiculopathy   [facility], MD   1 year ago Type 2 diabetes mellitus with hyperlipidemia [facility])   [place] [facility] Malva Limes, MD   1 year ago Type 2 diabetes mellitus with hyperlipidemia [facility])   [place] [facility] Sherrie Mustache, Demetrios Isaacs, MD

## 2023-09-22 ENCOUNTER — Other Ambulatory Visit: Payer: Self-pay | Admitting: Family Medicine

## 2023-09-22 DIAGNOSIS — G8921 Chronic pain due to trauma: Secondary | ICD-10-CM

## 2023-09-22 NOTE — Telephone Encounter (Signed)
Medication Refill - Medication: oxyCODONE (OXY IR/ROXICODONE) 5 MG immediate release tablet   Has the patient contacted their pharmacy? No.   Preferred Pharmacy (with phone number or street name):  [facility][id number] [place], [place] - [address] AT [facility] [address] Phone: [phone]  Fax: [phone]      Has the patient been seen for an appointment in the last year OR does the patient have an upcoming appointment? Yes.    The patient states she only has a few left and meant to call on Friday but forgot as it snuck up on her. Please assist patient further.

## 2023-09-23 NOTE — Telephone Encounter (Signed)
Requested medication (s) are due for refill today: na   Requested medication (s) are on the active medication list: yes   Last refill:  08/26/23 #135 0 refills   Future visit scheduled:  yes in 1 week   Notes to clinic:  not delegated per protocol. Do you want to refill Rx?     Requested Prescriptions  Pending Prescriptions Disp Refills   oxyCODONE (OXY IR/ROXICODONE) 5 MG immediate release tablet 135 tablet 0    Sig: Take 1.5 tablets (7.5 mg total) by mouth 3 (three) times daily.     Not Delegated - Analgesics:  Opioid Agonists Failed - 09/22/2023  1:46 PM      Failed - This refill cannot be delegated      Failed - Urine Drug Screen completed in last 360 days      Failed - Valid encounter within last 3 months    Recent Outpatient Visits           3 months ago Type 2 diabetes mellitus with hyperlipidemia (HCC)   [place] [facility] Mal[facility] L[facility], MD   6 months ago Type 2 diabetes mellitus with hyperlipidemia [facility])   [place] [facility] Malva Limes, MD   12 months ago Back pain with radiculopathy   [facility], MD   1 year ago Type 2 diabetes mellitus with hyperlipidemia [facility])   [place] [facility] Malva Limes, MD   2 years ago Type 2 diabetes mellitus with hyperlipidemia [facility])   [place] [facility] Malva Limes, MD       Future Appointments             In 1 week Fisher, Demetrios Isaacs, MD [facility]

## 2023-09-24 MED ORDER — OXYCODONE HCL 5 MG PO TABS
7.5000 mg | ORAL_TABLET | Freq: Three times a day (TID) | ORAL | 0 refills | Status: DC
Start: 2023-09-24 — End: 2023-10-23

## 2023-10-06 ENCOUNTER — Ambulatory Visit: Payer: Medicare Other | Admitting: Family Medicine

## 2023-10-06 ENCOUNTER — Encounter: Payer: Self-pay | Admitting: Family Medicine

## 2023-10-06 VITALS — BP 145/89 | HR 95 | Wt 187.5 lb

## 2023-10-06 DIAGNOSIS — E538 Deficiency of other specified B group vitamins: Secondary | ICD-10-CM | POA: Diagnosis not present

## 2023-10-06 DIAGNOSIS — E559 Vitamin D deficiency, unspecified: Secondary | ICD-10-CM | POA: Diagnosis not present

## 2023-10-06 DIAGNOSIS — E781 Pure hyperglyceridemia: Secondary | ICD-10-CM | POA: Diagnosis not present

## 2023-10-06 DIAGNOSIS — E785 Hyperlipidemia, unspecified: Secondary | ICD-10-CM | POA: Diagnosis not present

## 2023-10-06 DIAGNOSIS — I10 Essential (primary) hypertension: Secondary | ICD-10-CM

## 2023-10-06 DIAGNOSIS — Z7985 Long-term (current) use of injectable non-insulin antidiabetic drugs: Secondary | ICD-10-CM | POA: Diagnosis not present

## 2023-10-06 DIAGNOSIS — E1169 Type 2 diabetes mellitus with other specified complication: Secondary | ICD-10-CM

## 2023-10-06 LAB — POCT GLYCOSYLATED HEMOGLOBIN (HGB A1C): Hemoglobin A1C: 7.4 % — AB (ref 4.0–5.6)

## 2023-10-06 MED ORDER — TIRZEPATIDE 5 MG/0.5ML ~~LOC~~ SOAJ
5.0000 mg | SUBCUTANEOUS | 0 refills | Status: DC
Start: 2023-10-06 — End: 2023-11-23

## 2023-10-06 NOTE — Patient Instructions (Signed)
.   Please review the attached list of medications and notify my office if there are any errors.   . Please bring all of your medications to every appointment so we can make sure that our medication list is the same as yours.   

## 2023-10-06 NOTE — Progress Notes (Signed)
Established patient visit   Patient: Alison Landry   DOB: 08-20-1971   52 y.o. Female  MRN: [id number] Visit Date: 10/06/2023  Today's healthcare provider: Mila Merry, MD   Chief Complaint  Patient presents with   Medical Management of Chronic Issues   Subjective    Discussed the use of AI scribe software for clinical note transcription with the patient, who gave verbal consent to proceed.  History of Present Illness   The patient presents for diabetes follow up. She is on 2mg  Ozempic and reports a perceived plateau in the drug's effectiveness, with some doses appearing to curb her appetite and maintain her blood sugar levels, while others do not. She has attempted dietary modifications, including a week-long fast from sweets, which she reports was successful. She has also eliminated sodas from her diet, which she believes has helped to reduce her blood sugar levels from the 300s. She reports no adverse side effects from the Ozempic, aside from frequent belching.  The patient also reports taking vitamin D supplements and receiving vitamin D injections as part of her IV hydration therapy. She has resumed taking her cholesterol medication, rosuvastatin, despite experiencing deep cramping in her legs. She has been taking the medication nightly.  The patient has also expressed concerns about her blood pressure, which she believes may be influenced by her anxiety. She does not currently monitor her blood pressure at home but is considering purchasing a blood pressure cuff.       Medications: Outpatient Medications Prior to Visit  Medication Sig Note   albuterol (VENTOLIN HFA) 108 (90 Base) MCG/ACT inhaler INHALE 2 PUFFS INTO THE LUNGS EVERY 6 HOURS AS NEEDED FOR WHEEZING OR SHORTNESS OF BREATH    ALPRAZolam (XANAX) 0.5 MG tablet TAKE 1 TABLET(0.5 MG) BY MOUTH DAILY AS NEEDED FOR ANXIETY    amLODipine (NORVASC) 10 MG tablet TAKE 1 TABLET(10 MG) BY MOUTH DAILY    Continuous Blood  Gluc Receiver (DEXCOM G7 RECEIVER) DEVI Use to check blood sugar as needed for insulin dependent type 2 diabetes    Continuous Blood Gluc Sensor (DEXCOM G7 SENSOR) MISC Use to check blood sugar as needed for insulin dependent type 2 diabetes    Continuous Blood Gluc Transmit (DEXCOM G6 TRANSMITTER) MISC 1 Units by Does not apply route 4 (four) times daily. Use to check blood sugar four times daily. Replace after 90 days.    cyanocobalamin (VITAMIN B12) 1000 MCG tablet Take 1,000 mcg by mouth daily.    gabapentin (NEURONTIN) 600 MG tablet TAKE 1 TABLET(600 MG) BY MOUTH THREE TIMES DAILY    ibuprofen (ADVIL) 600 MG tablet Take 1 tablet (600 mg total) by mouth every 6 (six) hours as needed.    insulin lispro (HUMALOG) 100 UNIT/ML KwikPen INJECT 8 UNITS UNDER THE SKIN THREE TIMES DAILY    Insulin Pen Needle (BD PEN NEEDLE NANO 2ND GEN) 32G X 4 MM MISC Inject 1 each into the skin 3 (three) times daily. as directed    metFORMIN (GLUCOPHAGE-XR) 500 MG 24 hr tablet Take 2 tablets (1,000 mg total) by mouth 2 (two) times daily with a meal.    Omega-3 Fatty Acids (FISH OIL CONCENTRATE PO) Take 1 tablet by mouth daily.    ondansetron (ZOFRAN-ODT) 4 MG disintegrating tablet DISSOLVE 1 TABLET(4 MG) ON THE TONGUE EVERY 8 HOURS AS NEEDED FOR NAUSEA OR VOMITING    oxyCODONE (OXY IR/ROXICODONE) 5 MG immediate release tablet Take 1.5 tablets (7.5 mg total) by mouth 3 (  three) times daily.    rosuvastatin (CRESTOR) 10 MG tablet Take 1 tablet (10 mg total) by mouth daily.    sertraline (ZOLOFT) 50 MG tablet TAKE 1 TABLET(50 MG) BY MOUTH DAILY    tiZANidine (ZANAFLEX) 4 MG tablet TAKE 1 TABLET BY MOUTH EVERY 4 TO 6 HOURS AS NEEDED    traMADol (ULTRAM) 50 MG tablet TAKE 1 TABLET(50 MG) BY MOUTH THREE TIMES DAILY AS NEEDED    [DISCONTINUED] OZEMPIC, 2 MG/DOSE, 8 MG/3ML SOPN INJECT 2 MG ONCE EVERY 7 DAYS AS DIRECTED 10/06/2023: TO MOUNJARO   No facility-administered medications prior to visit.       Objective    BP (!)  145/89 (BP Location: Right Arm, Patient Position: Sitting, Cuff Size: Normal)   Pulse 95   Wt 187 lb 8 oz (85 kg)   SpO2 99%   BMI 32.18 kg/m    Physical Exam   CARDIOVASCULAR: Heart sounds normal, no signs of valve disease.     Results for orders placed or performed in visit on 10/06/23  POCT HgB A1C  Result Value Ref Range   Hemoglobin A1C 7.4 (A) 4.0 - 5.6 %    Assessment & Plan     1. Type 2 diabetes mellitus with hyperlipidemia (HCC) Maxed on Ozempic which she is tolerating, but goal A1c not met - tirzepatide (MOUNJARO) 5 MG/0.5ML Pen; Inject 5 mg into the skin once a week. TAKE IN PLACE OF OZEMPIC  Dispense: 2 mL; Refill: 0  2. Essential hypertension  - Comprehensive metabolic panel  3. Hypertriglyceridemia  - Lipid panel  4. Vitamin D deficiency  - VITAMIN D 25 Hydroxy (Vit-D Deficiency, Fractures)  5. B12 deficiency        Mila Merry, MD  [facility] [phone] (phone) [phone] (fax)  [facility]

## 2023-10-22 ENCOUNTER — Other Ambulatory Visit: Payer: Self-pay | Admitting: Family Medicine

## 2023-10-23 ENCOUNTER — Other Ambulatory Visit: Payer: Self-pay | Admitting: Family Medicine

## 2023-10-23 DIAGNOSIS — E781 Pure hyperglyceridemia: Secondary | ICD-10-CM | POA: Diagnosis not present

## 2023-10-23 DIAGNOSIS — I10 Essential (primary) hypertension: Secondary | ICD-10-CM | POA: Diagnosis not present

## 2023-10-23 DIAGNOSIS — E559 Vitamin D deficiency, unspecified: Secondary | ICD-10-CM | POA: Diagnosis not present

## 2023-10-23 DIAGNOSIS — G8921 Chronic pain due to trauma: Secondary | ICD-10-CM

## 2023-10-23 NOTE — Telephone Encounter (Signed)
Medication Refill -  Most Recent Primary Care Visit:  Provider: Malva Limes  Department: [facility]  Visit Type: OFFICE VISIT  Date: 10/06/2023  Medication: oxyCODONE (OXY IR/ROXICODONE) 5 MG immediate release tablet   Has the patient contacted their pharmacy? Yes  Is this the correct pharmacy for this prescription? Yes  This is the patient's preferred pharmacy:  [facility][id number] [place], [place] - [address] AT [facility] [address] [place] [address] Phone: [phone] Fax: [phone]  Has the prescription been filled recently? Yes  Is the patient out of the medication? Yes  Has the patient been seen for an appointment in the last year OR does the patient have an upcoming appointment? Yes  Can we respond through MyChart? Yes  Agent: Please be advised that Rx refills may take up to 3 business days. We ask that you follow-up with your pharmacy.

## 2023-10-23 NOTE — Telephone Encounter (Signed)
Requested Prescriptions  Refused Prescriptions Disp Refills   OZEMPIC, 2 MG/DOSE, 8 MG/3ML SOPN [Pharmacy Med Name: OZEMPIC (2 MG/DOSE) 8 MG/3ML SUBQ S] 3 mL     Sig: INJECT 2 MG ONCE EVERY 7 DAYS AS DIRECTED     Endocrinology:  Diabetes - GLP-1 Receptor Agonists - semaglutide Failed - 10/22/2023  6:54 PM      Failed - HBA1C in normal range and within 180 days    Hemoglobin A1C  Date Value Ref Range Status  10/06/2023 7.4 (A) 4.0 - 5.6 % Final  06/17/2014 5.5  Final   Hgb A1c MFr Bld  Date Value Ref Range Status  10/01/2022 7.9 (H) 4.8 - 5.6 % Final    Comment:             Prediabetes: 5.7 - 6.4          Diabetes: >6.4          Glycemic control for adults with diabetes: <7.0          Failed - Cr in normal range and within 360 days    Creatinine, Ser  Date Value Ref Range Status  10/04/2022 0.55 0.44 - 1.00 mg/dL Final   Creatinine, POC  Date Value Ref Range Status  11/21/2017 NA mg/dL Final         Passed - Valid encounter within last 6 months    Recent Outpatient Visits           2 weeks ago Type 2 diabetes mellitus with hyperlipidemia (HCC)   [place] [facility] Mal[facility] L[facility], MD   4 months ago Type 2 diabetes mellitus with hyperlipidemia [facility])   [place] [facility] Malva Limes, MD   7 months ago Type 2 diabetes mellitus with hyperlipidemia [facility])   [place] [facility] Malva Limes, MD   1 year ago Back pain with radiculopathy   [place] Malva Limes, MD   1 year ago Type 2 diabetes mellitus with hyperlipidemia [facility])   [facility] Sherrie Mustache, Demetrios Isaacs, MD

## 2023-10-24 LAB — COMPREHENSIVE METABOLIC PANEL
ALT: 37 [IU]/L — ABNORMAL HIGH (ref 0–32)
AST: 25 [IU]/L (ref 0–40)
Albumin: 4.4 g/dL (ref 3.9–4.9)
Alkaline Phosphatase: 123 [IU]/L — ABNORMAL HIGH (ref 44–121)
BUN/Creatinine Ratio: 18 (ref 9–23)
BUN: 11 mg/dL (ref 6–24)
Bilirubin Total: 0.3 mg/dL (ref 0.0–1.2)
CO2: 20 mmol/L (ref 20–29)
Calcium: 10 mg/dL (ref 8.7–10.2)
Chloride: 100 mmol/L (ref 96–106)
Creatinine, Ser: 0.62 mg/dL (ref 0.57–1.00)
Globulin, Total: 2.9 g/dL (ref 1.5–4.5)
Glucose: 201 mg/dL — ABNORMAL HIGH (ref 70–99)
Potassium: 4.3 mmol/L (ref 3.5–5.2)
Sodium: 139 mmol/L (ref 134–144)
Total Protein: 7.3 g/dL (ref 6.0–8.5)
eGFR: 108 mL/min/{1.73_m2} (ref >59)

## 2023-10-24 LAB — LIPID PANEL
Chol/HDL Ratio: 5.6 ratio — ABNORMAL HIGH (ref 0.0–4.4)
Cholesterol, Total: 189 mg/dL (ref 100–199)
HDL: 34 mg/dL — ABNORMAL LOW (ref >39)
LDL Chol Calc (NIH): 112 mg/dL — ABNORMAL HIGH (ref 0–99)
Triglycerides: 249 mg/dL — ABNORMAL HIGH (ref 0–149)
VLDL Cholesterol Cal: 43 mg/dL — ABNORMAL HIGH (ref 5–40)

## 2023-10-24 LAB — VITAMIN D 25 HYDROXY (VIT D DEFICIENCY, FRACTURES): Vit D, 25-Hydroxy: 27.9 ng/mL — ABNORMAL LOW (ref 30.0–100.0)

## 2023-10-24 NOTE — Telephone Encounter (Signed)
Requested medication (s) are due for refill today: Yes  Requested medication (s) are on the active medication list: Yes  Last refill:  09/24/23  Future visit scheduled: Yes  Notes to clinic:  Unable to refill per protocol, cannot delegate.      Requested Prescriptions  Pending Prescriptions Disp Refills   oxyCODONE (OXY IR/ROXICODONE) 5 MG immediate release tablet 135 tablet 0    Sig: Take 1.5 tablets (7.5 mg total) by mouth 3 (three) times daily.     Not Delegated - Analgesics:  Opioid Agonists Failed - 10/23/2023 10:51 AM      Failed - This refill cannot be delegated      Failed - Urine Drug Screen completed in last 360 days      Passed - Valid encounter within last 3 months    Recent Outpatient Visits           2 weeks ago Type 2 diabetes mellitus with hyperlipidemia (HCC)   [place], MD   4 months ago Type 2 diabetes mellitus with hyperlipidemia [facility])   [place] [facility] Mal[facility] Limes, MD   7 months ago Type 2 diabetes mellitus with hyperlipidemia [facility])   [place] [facility] Malva Limes, MD   1 year ago Back pain with radiculopathy   [place] [facility] Malva Limes, MD   1 year ago Type 2 diabetes mellitus with hyperlipidemia [facility])   [place] [facility] Sherrie Mustache, Demetrios Isaacs, MD

## 2023-10-25 MED ORDER — OXYCODONE HCL 5 MG PO TABS: 7.5000 mg | ORAL_TABLET | Freq: Three times a day (TID) | ORAL | 0 refills | Status: DC

## 2023-10-29 ENCOUNTER — Other Ambulatory Visit: Payer: Self-pay | Admitting: Family Medicine

## 2023-10-29 DIAGNOSIS — E1169 Type 2 diabetes mellitus with other specified complication: Secondary | ICD-10-CM

## 2023-10-29 MED ORDER — TIRZEPATIDE 7.5 MG/0.5ML ~~LOC~~ SOAJ: 7.5000 mg | SUBCUTANEOUS | 0 refills | Status: DC

## 2023-11-06 ENCOUNTER — Encounter: Payer: Self-pay | Admitting: Family Medicine

## 2023-11-06 NOTE — Telephone Encounter (Signed)
 Care team updated and letter sent for eye exam notes.

## 2023-11-20 ENCOUNTER — Other Ambulatory Visit: Payer: Self-pay | Admitting: Family Medicine

## 2023-11-20 ENCOUNTER — Encounter: Payer: Self-pay | Admitting: Family Medicine

## 2023-11-20 DIAGNOSIS — K2100 Gastro-esophageal reflux disease with esophagitis, without bleeding: Secondary | ICD-10-CM

## 2023-11-20 DIAGNOSIS — E1169 Type 2 diabetes mellitus with other specified complication: Secondary | ICD-10-CM

## 2023-11-20 NOTE — Telephone Encounter (Signed)
Requested medication (s) are due for refill today - yes  Requested medication (s) are on the active medication list -yes  Future visit scheduled -no  Last refill: 05/28/23 #20 3RF  Notes to clinic: non delegated Rx  Requested Prescriptions  Pending Prescriptions Disp Refills   ondansetron (ZOFRAN-ODT) 4 MG disintegrating tablet [Pharmacy Med Name: ONDANSETRON ODT 4MG  TABLETS] 20 tablet 3    Sig: DISSOLVE 1 TABLET(4 MG) ON THE TONGUE EVERY 8 HOURS AS NEEDED FOR NAUSEA OR VOMITING     Not Delegated - Gastroenterology: Antiemetics - ondansetron Failed - 11/20/2023 10:10 AM      Failed - This refill cannot be delegated      Failed - ALT in normal range and within 360 days    ALT  Date Value Ref Range Status  10/23/2023 37 (H) 0 - 32 IU/L Final         Passed - AST in normal range and within 360 days    AST  Date Value Ref Range Status  10/23/2023 25 0 - 40 IU/L Final         Passed - Valid encounter within last 6 months    Recent Outpatient Visits           1 month ago Type 2 diabetes mellitus with hyperlipidemia (HCC)   [place] [facility] Malva Limes, MD   5 months ago Type 2 diabetes mellitus with hyperlipidemia (HCC)   [place] [facility] Malva Limes, MD   8 months ago Type 2 diabetes mellitus with hyperlipidemia (HCC)   [place] [facility] Malva Limes, MD   1 year ago Back pain with radiculopathy   [place] [facility] Malva Limes, MD   1 year ago Type 2 diabetes mellitus with hyperlipidemia [facility])   [place] [facility] Malva Limes, MD                 Requested Prescriptions  Pending Prescriptions Disp Refills   ondansetron (ZOFRAN-ODT) 4 MG disintegrating tablet [Pharmacy Med Name: ONDANSETRON ODT 4MG  TABLETS] 20 tablet 3    Sig: DISSOLVE 1 TABLET(4 MG) ON THE TONGUE EVERY 8 HOURS AS NEEDED FOR NAUSEA OR VOMITING     Not Delegated -  Gastroenterology: Antiemetics - ondansetron Failed - 11/20/2023 10:10 AM      Failed - This refill cannot be delegated      Failed - ALT in normal range and within 360 days    ALT  Date Value Ref Range Status  10/23/2023 37 (H) 0 - 32 IU/L Final         Passed - AST in normal range and within 360 days    AST  Date Value Ref Range Status  10/23/2023 25 0 - 40 IU/L Final         Passed - Valid encounter within last 6 months    Recent Outpatient Visits           1 month ago Type 2 diabetes mellitus with hyperlipidemia (HCC)   [place] [facility], [facility]   5 months ago Type 2 diabetes mellitus with hyperlipidemia [facility])   [place] [facility] Mal[facility] Limes, MD   8 months ago Type 2 diabetes mellitus with hyperlipidemia [facility])    [facility], [facility]   1 year ago Back pain with radiculopathy    [place] [facility]  Malva Limes, MD   1 year ago Type 2 diabetes mellitus with hyperlipidemia [facility])   [place] [facility] Sherrie Mustache, Demetrios Isaacs, MD

## 2023-11-20 NOTE — Telephone Encounter (Signed)
Requested medication (s) are due for refill today - yes  Requested medication (s) are on the active medication list -yes  Future visit scheduled -no  Last refill: Mounjaro- 10/06/23 2ml- off protocol- provider review                  Alprazolam- 06/23/23 #30 3RF- non delegated Rx        Notes to clinic: see above  Requested Prescriptions  Pending Prescriptions Disp Refills   MOUNJARO 7.5 MG/0.5ML Pen [Pharmacy Med Name: MOUNJARO 7.5MG /0.5ML INJ (4 PENS)] 2 mL 0    Sig: ADMINISTER 7.5 MG UNDER THE SKIN 1 TIME A WEEK     Off-Protocol Failed - 11/20/2023 12:08 PM      Failed - Medication not assigned to a protocol, review manually.      Passed - Valid encounter within last 12 months    Recent Outpatient Visits           1 month ago Type 2 diabetes mellitus with hyperlipidemia (HCC)   [place] [facility]   5 months ago Type 2 diabetes mellitus with hyperlipidemia [facility])   [place] [facility] Malva Limes, MD   8 months ago Type 2 diabetes mellitus with hyperlipidemia [facility])   [place] [facility] Mal[facility] Limes, MD   1 year ago Back pain with radiculopathy   [place] [facility] Malva Limes, MD   1 year ago Type 2 diabetes mellitus with hyperlipidemia [facility])   [place] [facility] Sherrie Mustache, Demetrios Isaacs, MD               ALPRAZolam Prudy Feeler) 0.5 MG tablet [Pharmacy Med Name: ALPRAZOLAM 0.5MG  TABLETS] 30 tablet     Sig: TAKE 1 TABLET(0.5 MG) BY MOUTH DAILY AS NEEDED FOR ANXIETY     Not Delegated - Psychiatry: Anxiolytics/Hypnotics 2 Failed - 11/20/2023 12:08 PM      Failed - This refill cannot be delegated      Failed - Urine Drug Screen completed in last 360 days      Passed - Patient is not pregnant      Passed - Valid encounter within last 6 months    Recent Outpatient Visits           1 month ago Type 2 diabetes mellitus with hyperlipidemia (HCC)    [place] [facility] Mal[facility], MD   5 months ago Type 2 diabetes mellitus with hyperlipidemia [facility])   [place] [facility] Limes, MD   8 months ago Type 2 diabetes mellitus with hyperlipidemia [facility])   [place] [facility] Mal[facility] L[facility], MD   1 year ago Back pain with radiculopathy   [place] [facility] Mal[facility] Limes, MD   1 year ago Type 2 diabetes mellitus with hyperlipidemia [facility])   [place] [facility] Malva Limes, MD                 Requested Prescriptions  Pending Prescriptions Disp Refills   MOUNJARO 7.5 MG/0.5ML Pen [Pharmacy Med Name: Greggory Keen 7.5MG /0.5ML INJ (4 PENS)] 2 mL 0    Sig: ADMINISTER 7.5 MG UNDER THE SKIN 1 TIME A WEEK     Off-Protocol Failed - 11/20/2023 12:08 PM      Failed - Medication not assigned to a protocol, review manually.      Passed - Valid encounter within  last 12 months    Recent Outpatient Visits           1 month ago Type 2 diabetes mellitus with hyperlipidemia [facility])   [place] [facility], MD   5 months ago Type 2 diabetes mellitus with hyperlipidemia [facility])   [place] [facility] L[facility], MD   8 months ago Type 2 diabetes mellitus with hyperlipidemia [facility])   [place] [facility] Malva Limes, MD   1 year ago Back pain with radiculopathy   [place] [facility] Malva Limes, MD   1 year ago Type 2 diabetes mellitus with hyperlipidemia [facility])   [place] [facility] Sherrie Mustache, Demetrios Isaacs, MD               ALPRAZolam Prudy Feeler) 0.5 MG tablet [Pharmacy Med Name: ALPRAZOLAM 0.5MG  TABLETS] 30 tablet     Sig: TAKE 1 TABLET(0.5 MG) BY MOUTH DAILY AS NEEDED FOR ANXIETY     Not Delegated - Psychiatry: Anxiolytics/Hypnotics 2 Failed - 11/20/2023 12:08 PM      Failed - This refill cannot be delegated       Failed - Urine Drug Screen completed in last 360 days      Passed - Patient is not pregnant      Passed - Valid encounter within last 6 months    Recent Outpatient Visits           1 month ago Type 2 diabetes mellitus with hyperlipidemia (HCC)   [place] [facility], MD   5 months ago Type 2 diabetes mellitus with hyperlipidemia [facility])   [place], MD   8 months ago Type 2 diabetes mellitus with hyperlipidemia [facility])   [place] [facility] Mal[facility] Limes, MD   1 year ago Back pain with radiculopathy   [place] [facility] Mal[facility] Limes, MD   1 year ago Type 2 diabetes mellitus with hyperlipidemia [facility])   [place] [facility] Sherrie Mustache, Demetrios Isaacs, MD

## 2023-11-21 ENCOUNTER — Other Ambulatory Visit: Payer: Self-pay | Admitting: Family Medicine

## 2023-11-21 DIAGNOSIS — G8921 Chronic pain due to trauma: Secondary | ICD-10-CM

## 2023-11-21 NOTE — Telephone Encounter (Unsigned)
Copied from CRM [id number]. Topic: General - Other >> Nov 21, 2023  5:46 PM Everette C wrote: Reason for CRM: Medication Refill - Most Recent Primary Care Visit:  Provider: Malva Limes Department: [facility] Visit Type: OFFICE VISIT Date: 10/06/2023  Medication: oxyCODONE (OXY IR/ROXICODONE) 5 MG immediate release tablet [[id number]]  Has the patient contacted their pharmacy? No (Agent: If no, request that the patient contact the pharmacy for the refill. If patient does not wish to contact the pharmacy document the reason why and proceed with request.) (Agent: If yes, when and what did the pharmacy advise?)  Is this the correct pharmacy for this prescription? Yes If no, delete pharmacy and type the correct one.  This is the patient's preferred pharmacy:  [facility][id number] [place], [place] - [address] AT [facility] OF [address] [place] [address] Phone: [phone] Fax: [phone]  Has the prescription been filled recently? Yes  Is the patient out of the medication? Yes  Has the patient been seen for an appointment in the last year OR does the patient have an upcoming appointment? Yes  Can we respond through MyChart? Yes  Agent: Please be advised that Rx refills may take up to 3 business days. We ask that you follow-up with your pharmacy.

## 2023-11-24 ENCOUNTER — Telehealth: Payer: Self-pay | Admitting: Family Medicine

## 2023-11-24 ENCOUNTER — Other Ambulatory Visit: Payer: Self-pay | Admitting: Family Medicine

## 2023-11-24 MED ORDER — OXYCODONE HCL 5 MG PO TABS: 7.5000 mg | ORAL_TABLET | Freq: Three times a day (TID) | ORAL | 0 refills | Status: DC

## 2023-11-24 NOTE — Telephone Encounter (Signed)
Requested medication (s) are due for refill today -yes  Requested medication (s) are on the active medication list -yes  Future visit scheduled -no  Last refill: 10/25/23 #135  Notes to clinic: non delegated Rx  Requested Prescriptions  Pending Prescriptions Disp Refills   oxyCODONE (OXY IR/ROXICODONE) 5 MG immediate release tablet 135 tablet 0    Sig: Take 1.5 tablets (7.5 mg total) by mouth 3 (three) times daily.     Not Delegated - Analgesics:  Opioid Agonists Failed - 11/24/2023 12:19 PM      Failed - This refill cannot be delegated      Failed - Urine Drug Screen completed in last 360 days      Passed - Valid encounter within last 3 months    Recent Outpatient Visits           1 month ago Type 2 diabetes mellitus with hyperlipidemia (HCC)   [place] [facility]   5 months ago Type 2 diabetes mellitus with hyperlipidemia [facility])   [place] [facility] Mal[facility] Limes, MD   8 months ago Type 2 diabetes mellitus with hyperlipidemia [facility])   [facility], MD   1 year ago Back pain with radiculopathy   [place] [facility] Mal[facility] L[facility], MD   1 year ago Type 2 diabetes mellitus with hyperlipidemia [facility])   [place] [facility] Mal[facility] Limes, MD                 Requested Prescriptions  Pending Prescriptions Disp Refills   oxyCODONE (OXY IR/ROXICODONE) 5 MG immediate release tablet 135 tablet 0    Sig: Take 1.5 tablets (7.5 mg total) by mouth 3 (three) times daily.     Not Delegated - Analgesics:  Opioid Agonists Failed - 11/24/2023 12:19 PM      Failed - This refill cannot be delegated      Failed - Urine Drug Screen completed in last 360 days      Passed - Valid encounter within last 3 months    Recent Outpatient Visits           1 month ago Type 2 diabetes mellitus with hyperlipidemia (HCC)   [place] [facility] Mal[facility], MD   5 months ago Type 2 diabetes mellitus with hyperlipidemia [facility])   [place] [facility] L[facility], MD   8 months ago Type 2 diabetes mellitus with hyperlipidemia [facility])   [place] [facility]imes, MD   1 year ago Back pain with radiculopathy   [place] [facility] Mal[facility] L[facility], MD   1 year ago Type 2 diabetes mellitus with hyperlipidemia [facility])    [facility] Sherrie Mustache, Demetrios Isaacs, MD

## 2023-11-24 NOTE — Telephone Encounter (Signed)
Pt is calling in checking on the status of her refill request for oxyCODONE (OXY IR/ROXICODONE) 5 MG immediate release tablet [[id number]] . Pt says she would like it to be sent in before the holiday if possible. Please follow up with pt

## 2023-11-24 NOTE — Telephone Encounter (Signed)
Requested on 11/21/23 by pharmacy in a separate refill encounter, routed to the office and provider today, pending refill.

## 2023-11-26 NOTE — Addendum Note (Signed)
Addended by: Malva Limes on: 11/26/2023 09:15 AM   Modules accepted: Orders

## 2023-12-04 ENCOUNTER — Other Ambulatory Visit: Payer: Self-pay | Admitting: Family Medicine

## 2023-12-04 DIAGNOSIS — E781 Pure hyperglyceridemia: Secondary | ICD-10-CM

## 2023-12-04 MED ORDER — TIRZEPATIDE 10 MG/0.5ML ~~LOC~~ SOAJ: 10.0000 mg | SUBCUTANEOUS | 1 refills | Status: DC

## 2023-12-04 MED ORDER — ROSUVASTATIN CALCIUM 20 MG PO TABS: 20.0000 mg | ORAL_TABLET | Freq: Every day | ORAL | 3 refills | Status: AC

## 2023-12-12 ENCOUNTER — Other Ambulatory Visit: Payer: Self-pay | Admitting: Family Medicine

## 2023-12-17 ENCOUNTER — Other Ambulatory Visit: Payer: Self-pay | Admitting: Family Medicine

## 2023-12-17 DIAGNOSIS — E1169 Type 2 diabetes mellitus with other specified complication: Secondary | ICD-10-CM

## 2023-12-18 NOTE — Telephone Encounter (Signed)
Requested medication (s) are due for refill today: yes  Requested medication (s) are on the active medication list: yes  Last refill:  06/23/23 #360 1 RF  Future visit scheduled: no  Notes to clinic:  overdue lab work   Requested Prescriptions  Pending Prescriptions Disp Refills   metFORMIN (GLUCOPHAGE-XR) 500 MG 24 hr tablet [Pharmacy Med Name: METFORMIN ER 500MG  24HR TABS] 360 tablet 1    Sig: TAKE 2 TABLETS(1000 MG) BY MOUTH TWICE DAILY WITH A MEAL     Endocrinology:  Diabetes - Biguanides Failed - 12/18/2023  8:45 AM      Failed - CBC within normal limits and completed in the last 12 months    WBC  Date Value Ref Range Status  10/04/2022 8.3 4.0 - 10.5 K/uL Final   RBC  Date Value Ref Range Status  10/04/2022 4.91 3.87 - 5.11 MIL/uL Final   Hemoglobin  Date Value Ref Range Status  10/04/2022 14.4 12.0 - 15.0 g/dL Final  16/09/9603 54.0 11.1 - 15.9 g/dL Final   HCT  Date Value Ref Range Status  10/04/2022 41.0 36.0 - 46.0 % Final   Hematocrit  Date Value Ref Range Status  10/01/2022 39.5 34.0 - 46.6 % Final   MCHC  Date Value Ref Range Status  10/04/2022 35.1 30.0 - 36.0 g/dL Final   [facility]  Date Value Ref Range Status  10/04/2022 29.3 26.0 - 34.0 pg Final   MCV  Date Value Ref Range Status  10/04/2022 83.5 80.0 - 100.0 fL Final  10/01/2022 84 79 - 97 fL Final   No results found for: "PLTCOUNTKUC", "LABPLAT", "POCPLA" RDW  Date Value Ref Range Status  10/04/2022 11.9 11.5 - 15.5 % Final  10/01/2022 11.4 (L) 11.7 - 15.4 % Final         Passed - Cr in normal range and within 360 days    Creatinine, Ser  Date Value Ref Range Status  10/23/2023 0.62 0.57 - 1.00 mg/dL Final   Creatinine, POC  Date Value Ref Range Status  11/21/2017 NA mg/dL Final         Passed - HBA1C is between 0 and 7.9 and within 180 days    Hemoglobin A1C  Date Value Ref Range Status  10/06/2023 7.4 (A) 4.0 - 5.6 % Final  06/17/2014 5.5  Final   Hgb A1c MFr Bld  Date Value  Ref Range Status  10/01/2022 7.9 (H) 4.8 - 5.6 % Final    Comment:             Prediabetes: 5.7 - 6.4          Diabetes: >6.4          Glycemic control for adults with diabetes: <7.0          Passed - eGFR in normal range and within 360 days    GFR calc Af Amer  Date Value Ref Range Status  11/01/2020 122 >59 mL/min/1.73 Final    Comment:    **In accordance with recommendations from the NKF-ASN Task force,**   [facility] is in the process of updating its eGFR calculation to the   2021 CKD-EPI creatinine equation that estimates kidney function   without a race variable.    GFR, Estimated  Date Value Ref Range Status  10/04/2022 >60 >60 mL/min Final    Comment:    (NOTE) Calculated using the CKD-EPI Creatinine Equation (2021)    eGFR  Date Value Ref Range Status  10/23/2023 108 >59  mL/min/1.73 Final         Passed - B12 Level in normal range and within 720 days    Vitamin B-12  Date Value Ref Range Status  07/02/2023 266 232 - 1,245 pg/mL Final         Passed - Valid encounter within last 6 months    Recent Outpatient Visits           2 months ago Type 2 diabetes mellitus with hyperlipidemia (HCC)   [place] [facility], MD   5 months ago Type 2 diabetes mellitus with hyperlipidemia [facility])   [place] [facility] Mal[facility] Limes, MD   9 months ago Type 2 diabetes mellitus with hyperlipidemia [facility])   [place] [facility] Mal[facility] Limes, MD   1 year ago Back pain with radiculopathy   [place] [facility] Malva Limes, MD   1 year ago Type 2 diabetes mellitus with hyperlipidemia [facility])    [facility] Sherrie Mustache, Demetrios Isaacs, MD

## 2023-12-18 NOTE — Telephone Encounter (Signed)
Duplicate request, Rx reordered today 12/18/23.  Requested Prescriptions  Pending Prescriptions Disp Refills   metFORMIN (GLUCOPHAGE-XR) 500 MG 24 hr tablet [Pharmacy Med Name: METFORMIN ER 500MG  24HR TABS] 360 tablet 1    Sig: TAKE 2 TABLETS(1000 MG) BY MOUTH TWICE DAILY WITH A MEAL     Endocrinology:  Diabetes - Biguanides Failed - 12/18/2023  9:22 AM      Failed - CBC within normal limits and completed in the last 12 months    WBC  Date Value Ref Range Status  10/04/2022 8.3 4.0 - 10.5 K/uL Final   RBC  Date Value Ref Range Status  10/04/2022 4.91 3.87 - 5.11 MIL/uL Final   Hemoglobin  Date Value Ref Range Status  10/04/2022 14.4 12.0 - 15.0 g/dL Final  16/09/9603 54.0 11.1 - 15.9 g/dL Final   HCT  Date Value Ref Range Status  10/04/2022 41.0 36.0 - 46.0 % Final   Hematocrit  Date Value Ref Range Status  10/01/2022 39.5 34.0 - 46.6 % Final   MCHC  Date Value Ref Range Status  10/04/2022 35.1 30.0 - 36.0 g/dL Final   [facility]  Date Value Ref Range Status  10/04/2022 29.3 26.0 - 34.0 pg Final   MCV  Date Value Ref Range Status  10/04/2022 83.5 80.0 - 100.0 fL Final  10/01/2022 84 79 - 97 fL Final   No results found for: "PLTCOUNTKUC", "LABPLAT", "POCPLA" RDW  Date Value Ref Range Status  10/04/2022 11.9 11.5 - 15.5 % Final  10/01/2022 11.4 (L) 11.7 - 15.4 % Final         Passed - Cr in normal range and within 360 days    Creatinine, Ser  Date Value Ref Range Status  10/23/2023 0.62 0.57 - 1.00 mg/dL Final   Creatinine, POC  Date Value Ref Range Status  11/21/2017 NA mg/dL Final         Passed - HBA1C is between 0 and 7.9 and within 180 days    Hemoglobin A1C  Date Value Ref Range Status  10/06/2023 7.4 (A) 4.0 - 5.6 % Final  06/17/2014 5.5  Final   Hgb A1c MFr Bld  Date Value Ref Range Status  10/01/2022 7.9 (H) 4.8 - 5.6 % Final    Comment:             Prediabetes: 5.7 - 6.4          Diabetes: >6.4          Glycemic control for adults with diabetes:  <7.0          Passed - eGFR in normal range and within 360 days    GFR calc Af Amer  Date Value Ref Range Status  11/01/2020 122 >59 mL/min/1.73 Final    Comment:    **In accordance with recommendations from the NKF-ASN Task force,**   [facility] is in the process of updating its eGFR calculation to the   2021 CKD-EPI creatinine equation that estimates kidney function   without a race variable.    GFR, Estimated  Date Value Ref Range Status  10/04/2022 >60 >60 mL/min Final    Comment:    (NOTE) Calculated using the CKD-EPI Creatinine Equation (2021)    eGFR  Date Value Ref Range Status  10/23/2023 108 >59 mL/min/1.73 Final         Passed - B12 Level in normal range and within 720 days    Vitamin B-12  Date Value Ref Range Status  07/02/2023 266 232 -  1,245 pg/mL Final         Passed - Valid encounter within last 6 months    Recent Outpatient Visits           2 months ago Type 2 diabetes mellitus with hyperlipidemia [facility])   [place] [facility] Malva Limes, MD   5 months ago Type 2 diabetes mellitus with hyperlipidemia [facility])   [place] [facility] Malva Limes, MD   9 months ago Type 2 diabetes mellitus with hyperlipidemia [facility])   [place] [facility] Malva Limes, MD   1 year ago Back pain with radiculopathy   [place] [facility] Malva Limes, MD   1 year ago Type 2 diabetes mellitus with hyperlipidemia [facility])   [place] [facility] Malva Limes, MD

## 2023-12-19 NOTE — Telephone Encounter (Signed)
[facility] called and spoke to [place], Pensions consultant regarding the metformin refill that shows sent on 12/18/23. She says the never received a prescription on 12/18/23. She says the only thing showing is a denial for the metformin. Advised it was 2 separate refill requests for metformin and because it was sent it, when the nurse saw the 2nd request she denied it as a duplicate. Advised I will send to the provider to resubmit it.

## 2023-12-19 NOTE — Telephone Encounter (Addendum)
Pharmacist stated  metFORMIN (GLUCOPHAGE-XR) 500 MG 24 hr tablet received but denied by prescribing. Patient would like a follow up call today.   [facility] #[id number] [place], [place] - [address] AT [facility] OF [address] Phone: [phone]  Fax: [phone]

## 2023-12-23 ENCOUNTER — Other Ambulatory Visit: Payer: Self-pay | Admitting: Family Medicine

## 2023-12-23 DIAGNOSIS — G8921 Chronic pain due to trauma: Secondary | ICD-10-CM

## 2023-12-23 DIAGNOSIS — E1169 Type 2 diabetes mellitus with other specified complication: Secondary | ICD-10-CM

## 2023-12-23 NOTE — Telephone Encounter (Signed)
metformin- 12/18/23 #360 1RF- called to pharmacy to confirm Rx received 12/18/23- they do not have on file- this Rx has been sent numerous times and is not going through- talked to pharmacist:Jacquelyn - verbal Rx given as written in chart Requested Prescriptions  Pending Prescriptions Disp Refills   metFORMIN (GLUCOPHAGE-XR) 500 MG 24 hr tablet 360 tablet 1     Endocrinology:  Diabetes - Biguanides Failed - 12/23/2023  3:56 PM      Failed - CBC within normal limits and completed in the last 12 months    WBC  Date Value Ref Range Status  10/04/2022 8.3 4.0 - 10.5 K/uL Final   RBC  Date Value Ref Range Status  10/04/2022 4.91 3.87 - 5.11 MIL/uL Final   Hemoglobin  Date Value Ref Range Status  10/04/2022 14.4 12.0 - 15.0 g/dL Final  20/25/4270 62.3 11.1 - 15.9 g/dL Final   HCT  Date Value Ref Range Status  10/04/2022 41.0 36.0 - 46.0 % Final   Hematocrit  Date Value Ref Range Status  10/01/2022 39.5 34.0 - 46.6 % Final   MCHC  Date Value Ref Range Status  10/04/2022 35.1 30.0 - 36.0 g/dL Final   [facility]  Date Value Ref Range Status  10/04/2022 29.3 26.0 - 34.0 pg Final   MCV  Date Value Ref Range Status  10/04/2022 83.5 80.0 - 100.0 fL Final  10/01/2022 84 79 - 97 fL Final   No results found for: "PLTCOUNTKUC", "LABPLAT", "POCPLA" RDW  Date Value Ref Range Status  10/04/2022 11.9 11.5 - 15.5 % Final  10/01/2022 11.4 (L) 11.7 - 15.4 % Final         Passed - Cr in normal range and within 360 days    Creatinine, Ser  Date Value Ref Range Status  10/23/2023 0.62 0.57 - 1.00 mg/dL Final   Creatinine, POC  Date Value Ref Range Status  11/21/2017 NA mg/dL Final         Passed - HBA1C is between 0 and 7.9 and within 180 days    Hemoglobin A1C  Date Value Ref Range Status  10/06/2023 7.4 (A) 4.0 - 5.6 % Final  06/17/2014 5.5  Final   Hgb A1c MFr Bld  Date Value Ref Range Status  10/01/2022 7.9 (H) 4.8 - 5.6 % Final    Comment:             Prediabetes: 5.7 - 6.4           Diabetes: >6.4          Glycemic control for adults with diabetes: <7.0          Passed - eGFR in normal range and within 360 days    GFR calc Af Amer  Date Value Ref Range Status  11/01/2020 122 >59 mL/min/1.73 Final    Comment:    **In accordance with recommendations from the NKF-ASN Task force,**   [facility] is in the process of updating its eGFR calculation to the   2021 CKD-EPI creatinine equation that estimates kidney function   without a race variable.    GFR, Estimated  Date Value Ref Range Status  10/04/2022 >60 >60 mL/min Final    Comment:    (NOTE) Calculated using the CKD-EPI Creatinine Equation (2021)    eGFR  Date Value Ref Range Status  10/23/2023 108 >59 mL/min/1.73 Final         Passed - B12 Level in normal range and within 720 days    Vitamin  B-12  Date Value Ref Range Status  07/02/2023 266 232 - 1,245 pg/mL Final         Passed - Valid encounter within last 6 months    Recent Outpatient Visits           2 months ago Type 2 diabetes mellitus with hyperlipidemia (HCC)   [place] [facility] Malva Limes, MD   6 months ago Type 2 diabetes mellitus with hyperlipidemia [facility])   [facility] [facility], MD   9 months ago Type 2 diabetes mellitus with hyperlipidemia [facility])   [facility], MD   1 year ago Back pain with radiculopathy   [place] [facility] Malva Limes, MD   1 year ago Type 2 diabetes mellitus with hyperlipidemia [facility])   [place] Malva Limes, MD               oxyCODONE (OXY IR/ROXICODONE) 5 MG immediate release tablet 135 tablet 0    Sig: Take 1.5 tablets (7.5 mg total) by mouth 3 (three) times daily.     Not Delegated - Analgesics:  Opioid Agonists Failed - 12/23/2023  3:56 PM      Failed - This refill cannot be delegated      Failed - Urine Drug Screen completed in last  360 days      Passed - Valid encounter within last 3 months    Recent Outpatient Visits           2 months ago Type 2 diabetes mellitus with hyperlipidemia (HCC)   [place] [facility], MD   6 months ago Type 2 diabetes mellitus with hyperlipidemia [facility])   [place] [facility] Mal[facility] Limes, MD   9 months ago Type 2 diabetes mellitus with hyperlipidemia [facility])   [facility] Mal[facility] L[facility], MD   1 year ago Back pain with radiculopathy   [place] [facility] Malva Limes, MD   1 year ago Type 2 diabetes mellitus with hyperlipidemia [facility])   [facility] Sherrie Mustache, Demetrios Isaacs, MD

## 2023-12-23 NOTE — Telephone Encounter (Signed)
  Medication Refill - Most Recent Primary Care Visit:  Provider: Malva Limes  Department: [facility]  Visit Type: OFFICE VISIT  Date: 10/06/2023  Medication: metFORMIN (GLUCOPHAGE-XR) 500 MG 24 hr tablet [[id number]] oxyCODONE (OXY IR/ROXICODONE) 5 MG immediate release tablet [[id number]]   Has the patient contacted their pharmacy? Yes  (Agent: If yes, when and what did the pharmacy advise?) pt says she reached out to [facility] who said they haven't gotten anything except for the denied request.   Is this the correct pharmacy for this prescription? Yes If no, delete pharmacy and type the correct one.  This is the patient's preferred pharmacy:  [id number] [place], [place] - [address] AT [facility] [address] [place] [address] Phone: [phone] Fax: [phone]    Has the prescription been filled recently? Yes  Is the patient out of the medication? Yes  Has the patient been seen for an appointment in the last year OR does the patient have an upcoming appointment? Yes  Can we respond through MyChart? Yes  Agent: Please be advised that Rx refills may take up to 3 business days. We ask that you follow-up with your pharmacy.

## 2023-12-23 NOTE — Telephone Encounter (Signed)
Requested medication (s) are due for refill today -yes  Requested medication (s) are on the active medication list -yes  Future visit scheduled -yes  Last refill: 11/24/23 #135  Notes to clinic: non delegated Rx  Requested Prescriptions  Pending Prescriptions Disp Refills   oxyCODONE (OXY IR/ROXICODONE) 5 MG immediate release tablet 135 tablet 0    Sig: Take 1.5 tablets (7.5 mg total) by mouth 3 (three) times daily.     Not Delegated - Analgesics:  Opioid Agonists Failed - 12/23/2023  3:57 PM      Failed - This refill cannot be delegated      Failed - Urine Drug Screen completed in last 360 days      Passed - Valid encounter within last 3 months    Recent Outpatient Visits           2 months ago Type 2 diabetes mellitus with hyperlipidemia (HCC)   [facility], [facility]   6 months ago Type 2 diabetes mellitus with hyperlipidemia (HCC)   [facility], MD   9 months ago Type 2 diabetes mellitus with hyperlipidemia (HCC)   [facility] Malva Limes, MD   1 year ago Back pain with radiculopathy    [facility], [facility]   1 year ago Type 2 diabetes mellitus with hyperlipidemia [facility])    [facility] Malva Limes, MD              Refused Prescriptions Disp Refills   metFORMIN (GLUCOPHAGE-XR) 500 MG 24 hr tablet 360 tablet 1     Endocrinology:  Diabetes - Biguanides Failed - 12/23/2023  3:57 PM      Failed - CBC within normal limits and completed in the last 12 months    WBC  Date Value Ref Range Status  10/04/2022 8.3 4.0 - 10.5 K/uL Final   RBC  Date Value Ref Range Status  10/04/2022 4.91 3.87 - 5.11 MIL/uL Final   Hemoglobin  Date Value Ref Range Status  10/04/2022 14.4 12.0 - 15.0 g/dL Final  05/31/1600 09.3 11.1 - 15.9 g/dL Final   HCT  Date Value Ref Range Status  10/04/2022 41.0 36.0  - 46.0 % Final   Hematocrit  Date Value Ref Range Status  10/01/2022 39.5 34.0 - 46.6 % Final   MCHC  Date Value Ref Range Status  10/04/2022 35.1 30.0 - 36.0 g/dL Final   [facility]  Date Value Ref Range Status  10/04/2022 29.3 26.0 - 34.0 pg Final   MCV  Date Value Ref Range Status  10/04/2022 83.5 80.0 - 100.0 fL Final  10/01/2022 84 79 - 97 fL Final   No results found for: "PLTCOUNTKUC", "LABPLAT", "POCPLA" RDW  Date Value Ref Range Status  10/04/2022 11.9 11.5 - 15.5 % Final  10/01/2022 11.4 (L) 11.7 - 15.4 % Final         Passed - Cr in normal range and within 360 days    Creatinine, Ser  Date Value Ref Range Status  10/23/2023 0.62 0.57 - 1.00 mg/dL Final   Creatinine, POC  Date Value Ref Range Status  11/21/2017 NA mg/dL Final         Passed - HBA1C is between 0 and 7.9 and within 180 days    Hemoglobin A1C  Date Value Ref Range Status  10/06/2023 7.4 (A) 4.0 - 5.6 % Final  06/17/2014  5.5  Final   Hgb A1c MFr Bld  Date Value Ref Range Status  10/01/2022 7.9 (H) 4.8 - 5.6 % Final    Comment:             Prediabetes: 5.7 - 6.4          Diabetes: >6.4          Glycemic control for adults with diabetes: <7.0          Passed - eGFR in normal range and within 360 days    GFR calc Af Amer  Date Value Ref Range Status  11/01/2020 122 >59 mL/min/1.73 Final    Comment:    **In accordance with recommendations from the NKF-ASN Task force,**   [facility] is in the process of updating its eGFR calculation to the   2021 CKD-EPI creatinine equation that estimates kidney function   without a race variable.    GFR, Estimated  Date Value Ref Range Status  10/04/2022 >60 >60 mL/min Final    Comment:    (NOTE) Calculated using the CKD-EPI Creatinine Equation (2021)    eGFR  Date Value Ref Range Status  10/23/2023 108 >59 mL/min/1.73 Final         Passed - B12 Level in normal range and within 720 days    Vitamin B-12  Date Value Ref Range Status  07/02/2023 266  232 - 1,245 pg/mL Final         Passed - Valid encounter within last 6 months    Recent Outpatient Visits           2 months ago Type 2 diabetes mellitus with hyperlipidemia (HCC)   [place] [facility] Mal[facility], MD   6 months ago Type 2 diabetes mellitus with hyperlipidemia [facility])   [facility] Mal[facility], MD   9 months ago Type 2 diabetes mellitus with hyperlipidemia [facility])   [facility] Mal[facility] Limes, MD   1 year ago Back pain with radiculopathy   [facility] Mal[facility] Limes, MD   1 year ago Type 2 diabetes mellitus with hyperlipidemia [facility])   [facility] Malva Limes, MD                 Requested Prescriptions  Pending Prescriptions Disp Refills   oxyCODONE (OXY IR/ROXICODONE) 5 MG immediate release tablet 135 tablet 0    Sig: Take 1.5 tablets (7.5 mg total) by mouth 3 (three) times daily.     Not Delegated - Analgesics:  Opioid Agonists Failed - 12/23/2023  3:57 PM      Failed - This refill cannot be delegated      Failed - Urine Drug Screen completed in last 360 days      Passed - Valid encounter within last 3 months    Recent Outpatient Visits           2 months ago Type 2 diabetes mellitus with hyperlipidemia [facility])   [place] Mal[facility] L[facility], MD   6 months ago Type 2 diabetes mellitus with hyperlipidemia [facility])   [place] [facility] Malva Limes, MD   9 months ago Type 2 diabetes mellitus with hyperlipidemia [facility])   [facility] Malva Limes, MD   1 year ago Back pain with radiculopathy   [facility] Malva Limes, MD   1 year  ago Type 2 diabetes mellitus with hyperlipidemia (HCC)   [place] [facility] Malva Limes, MD              Refused Prescriptions Disp Refills    metFORMIN (GLUCOPHAGE-XR) 500 MG 24 hr tablet 360 tablet 1     Endocrinology:  Diabetes - Biguanides Failed - 12/23/2023  3:57 PM      Failed - CBC within normal limits and completed in the last 12 months    WBC  Date Value Ref Range Status  10/04/2022 8.3 4.0 - 10.5 K/uL Final   RBC  Date Value Ref Range Status  10/04/2022 4.91 3.87 - 5.11 MIL/uL Final   Hemoglobin  Date Value Ref Range Status  10/04/2022 14.4 12.0 - 15.0 g/dL Final  16/09/9603 54.0 11.1 - 15.9 g/dL Final   HCT  Date Value Ref Range Status  10/04/2022 41.0 36.0 - 46.0 % Final   Hematocrit  Date Value Ref Range Status  10/01/2022 39.5 34.0 - 46.6 % Final   MCHC  Date Value Ref Range Status  10/04/2022 35.1 30.0 - 36.0 g/dL Final   [facility]  Date Value Ref Range Status  10/04/2022 29.3 26.0 - 34.0 pg Final   MCV  Date Value Ref Range Status  10/04/2022 83.5 80.0 - 100.0 fL Final  10/01/2022 84 79 - 97 fL Final   No results found for: "PLTCOUNTKUC", "LABPLAT", "POCPLA" RDW  Date Value Ref Range Status  10/04/2022 11.9 11.5 - 15.5 % Final  10/01/2022 11.4 (L) 11.7 - 15.4 % Final         Passed - Cr in normal range and within 360 days    Creatinine, Ser  Date Value Ref Range Status  10/23/2023 0.62 0.57 - 1.00 mg/dL Final   Creatinine, POC  Date Value Ref Range Status  11/21/2017 NA mg/dL Final         Passed - HBA1C is between 0 and 7.9 and within 180 days    Hemoglobin A1C  Date Value Ref Range Status  10/06/2023 7.4 (A) 4.0 - 5.6 % Final  06/17/2014 5.5  Final   Hgb A1c MFr Bld  Date Value Ref Range Status  10/01/2022 7.9 (H) 4.8 - 5.6 % Final    Comment:             Prediabetes: 5.7 - 6.4          Diabetes: >6.4          Glycemic control for adults with diabetes: <7.0          Passed - eGFR in normal range and within 360 days    GFR calc Af Amer  Date Value Ref Range Status  11/01/2020 122 >59 mL/min/1.73 Final    Comment:    **In accordance with recommendations from the NKF-ASN  Task force,**   [facility] is in the process of updating its eGFR calculation to the   2021 CKD-EPI creatinine equation that estimates kidney function   without a race variable.    GFR, Estimated  Date Value Ref Range Status  10/04/2022 >60 >60 mL/min Final    Comment:    (NOTE) Calculated using the CKD-EPI Creatinine Equation (2021)    eGFR  Date Value Ref Range Status  10/23/2023 108 >59 mL/min/1.73 Final         Passed - B12 Level in normal range and within 720 days    Vitamin B-12  Date Value Ref Range Status  07/02/2023 266 232 -  1,245 pg/mL Final         Passed - Valid encounter within last 6 months    Recent Outpatient Visits           2 months ago Type 2 diabetes mellitus with hyperlipidemia [facility]   [place] Mal[facility] L[facility], MD   6 months ago Type 2 diabetes mellitus with hyperlipidemia [facility])   [facility] Malva Limes, MD   9 months ago Type 2 diabetes mellitus with hyperlipidemia [facility])   [facility] Malva Limes, MD   1 year ago Back pain with radiculopathy   [place] [facility] Malva Limes, MD   1 year ago Type 2 diabetes mellitus with hyperlipidemia [facility])   [facility] Malva Limes, MD

## 2023-12-24 MED ORDER — OXYCODONE HCL 5 MG PO TABS: 7.5000 mg | ORAL_TABLET | Freq: Three times a day (TID) | ORAL | 0 refills | Status: DC

## 2024-01-07 ENCOUNTER — Other Ambulatory Visit: Payer: Self-pay | Admitting: Family Medicine

## 2024-01-07 DIAGNOSIS — E1169 Type 2 diabetes mellitus with other specified complication: Secondary | ICD-10-CM

## 2024-01-19 ENCOUNTER — Other Ambulatory Visit: Payer: Self-pay | Admitting: Family Medicine

## 2024-01-19 NOTE — Telephone Encounter (Signed)
Requested medication (s) are due for refill today: Yes  Requested medication (s) are on the active medication list: Yes  Last refill:  11/23/23  Future visit scheduled: No  Notes to clinic:  Unable to refill per protocol, cannot delegate.'     Requested Prescriptions  Pending Prescriptions Disp Refills   ALPRAZolam (XANAX) 0.5 MG tablet [Pharmacy Med Name: ALPRAZOLAM 0.5MG  TABLETS] 30 tablet     Sig: TAKE 1 TABLET(0.5 MG) BY MOUTH DAILY AS NEEDED FOR ANXIETY     Not Delegated - Psychiatry: Anxiolytics/Hypnotics 2 Failed - 01/19/2024  4:10 PM      Failed - This refill cannot be delegated      Failed - Urine Drug Screen completed in last 360 days      Passed - Patient is not pregnant      Passed - Valid encounter within last 6 months    Recent Outpatient Visits           3 months ago Type 2 diabetes mellitus with hyperlipidemia (HCC)   [place] [facility]   7 months ago Type 2 diabetes mellitus with hyperlipidemia [facility])   [place] [facility] Mal[facility] Limes, MD   10 months ago Type 2 diabetes mellitus with hyperlipidemia [facility])   [place] [facility] Malva Limes, MD   1 year ago Back pain with radiculopathy   [place] Mal[facility] L[facility], MD   1 year ago Type 2 diabetes mellitus with hyperlipidemia [facility])   [place] Sherrie Mustache, Demetrios Isaacs, MD

## 2024-01-26 ENCOUNTER — Telehealth: Payer: Self-pay

## 2024-01-26 ENCOUNTER — Other Ambulatory Visit: Payer: Self-pay | Admitting: Family Medicine

## 2024-01-26 DIAGNOSIS — G8921 Chronic pain due to trauma: Secondary | ICD-10-CM

## 2024-01-26 NOTE — Telephone Encounter (Unsigned)
 Copied from CRM [id number]. Topic: Clinical - Medication Refill >> Jan 26, 2024 10:14 AM Antony Haste wrote: Most Recent Primary Care Visit:  Provider: Malva Limes  Department: [id number]  Visit Type: OFFICE VISIT  Date: 10/06/2023  Medication: oxyCODONE (OXY IR/ROXICODONE) 5 MG immediate release tablet  Has the patient contacted their pharmacy? Yes (Agent: If no, request that the patient contact the pharmacy for the refill. If patient does not wish to contact the pharmacy document the reason why and proceed with request.) (Agent: If yes, when and what did the pharmacy advise?)  Is this the correct pharmacy for this prescription? Yes If no, delete pharmacy and type the correct one.  This is the patient's preferred pharmacy:  [facility][id number] [place], [place] - [address] AT [facility] [address] [place] [address] Phone: [phone] Fax: [phone]  [facility] - [place], [place] - [address] [place] [address] Phone: [phone] Fax: [phone]   Has the prescription been filled recently? No  Is the patient out of the medication? Yes  Has the patient been seen for an appointment in the last year OR does the patient have an upcoming appointment? Yes  Can we respond through MyChart? No, callback preferred.  Agent: Please be advised that Rx refills may take up to 3 business days. We ask that you follow-up with your pharmacy.

## 2024-01-26 NOTE — Telephone Encounter (Unsigned)
 Copied from CRM [id number]. Topic: Clinical - Medication Question >> Jan 26, 2024 10:16 AM Antony Haste wrote: Reason for CRM: PT is requesting to no longer take her [facility], she states sometimes the pens are faulty and would like to switch back to Ozempic. She is requesting her Ozempic to be sent to [facility]: [address] [place], [place] [address] Phone: [phone] Fax: [phone]

## 2024-01-26 NOTE — Telephone Encounter (Signed)
 She needs to schedule o.v. to check on A1c, lipids and review medications.

## 2024-01-27 ENCOUNTER — Other Ambulatory Visit: Payer: Self-pay | Admitting: Family Medicine

## 2024-01-27 DIAGNOSIS — G8921 Chronic pain due to trauma: Secondary | ICD-10-CM

## 2024-01-27 NOTE — Telephone Encounter (Signed)
 Requested medications are due for refill today.  yes  Requested medications are on the active medications list.  yes  Last refill. 12/24/2023 #135 0 rf  Future visit scheduled.   no  Notes to clinic.  Refill not delegated.    Requested Prescriptions  Pending Prescriptions Disp Refills   oxyCODONE (OXY IR/ROXICODONE) 5 MG immediate release tablet 135 tablet 0    Sig: Take 1.5 tablets (7.5 mg total) by mouth 3 (three) times daily.     Not Delegated - Analgesics:  Opioid Agonists Failed - 01/27/2024 12:12 PM      Failed - This refill cannot be delegated      Failed - Urine Drug Screen completed in last 360 days      Failed - Valid encounter within last 3 months    Recent Outpatient Visits           3 months ago Type 2 diabetes mellitus with hyperlipidemia (HCC)   [place] [facility]   7 months ago Type 2 diabetes mellitus with hyperlipidemia [facility])   [place] [facility] Malva Limes, MD   10 months ago Type 2 diabetes mellitus with hyperlipidemia [facility])   [place] Mal[facility] Limes, MD   1 year ago Back pain with radiculopathy   [place] [facility] Malva Limes, MD   1 year ago Type 2 diabetes mellitus with hyperlipidemia [facility])   [place] Sherrie Mustache, Demetrios Isaacs, MD

## 2024-01-28 MED ORDER — OXYCODONE HCL 5 MG PO TABS: 7.5000 mg | ORAL_TABLET | Freq: Three times a day (TID) | ORAL | 0 refills | Status: DC

## 2024-01-28 NOTE — Telephone Encounter (Signed)
 Pt states that she can not come on 02/10/2024 and already has an appt on 02/16/2024 and can wait until that date.

## 2024-01-28 NOTE — Telephone Encounter (Signed)
 I'm note here next week. She can have the 11:20 on the 11th  or the 2:40 on the 14th

## 2024-01-29 ENCOUNTER — Other Ambulatory Visit: Payer: Self-pay | Admitting: Family Medicine

## 2024-01-29 DIAGNOSIS — G8921 Chronic pain due to trauma: Secondary | ICD-10-CM

## 2024-01-29 NOTE — Telephone Encounter (Signed)
 Copied from CRM [id number]. Topic: Clinical - Prescription Issue >> Jan 29, 2024  9:44 AM Patsy Lager T wrote: Reason for CRM: patient called stated oxyCODONE (OXY IR/ROXICODONE) 5 MG immediate release tablet is out of stock at [facility][id number] [place], [place] - [address] AT [facility] [address]  [address] [place], [place] [address]. Patient request to have the script sent to [facility] [address] [facility] - [place], [place] - [address] [facility] [address] Please f/u with patient

## 2024-01-30 NOTE — Telephone Encounter (Signed)
 Requested medications are due for refill today.  See note  Requested medications are on the active medications list.  yes  Last refill. 01/28/2024 - Medication is out of stock at the pharmacy that the medication was called into.  Future visit scheduled.   no  Notes to clinic.  Pt needs medication called into a different pharmacy. The one called into does not have the medication in stock.    Requested Prescriptions  Pending Prescriptions Disp Refills   oxyCODONE (OXY IR/ROXICODONE) 5 MG immediate release tablet 135 tablet 0    Sig: Take 1.5 tablets (7.5 mg total) by mouth 3 (three) times daily.     Not Delegated - Analgesics:  Opioid Agonists Failed - 01/30/2024  4:40 PM      Failed - This refill cannot be delegated      Failed - Urine Drug Screen completed in last 360 days      Failed - Valid encounter within last 3 months    Recent Outpatient Visits           3 months ago Type 2 diabetes mellitus with hyperlipidemia (HCC)   [place] [facility] Mal[facility] Limes, MD   7 months ago Type 2 diabetes mellitus with hyperlipidemia [facility])   [place] [facility] Mal[facility] Limes, MD   10 months ago Type 2 diabetes mellitus with hyperlipidemia [facility])   [place] [facility] Mal[facility], MD   1 year ago Back pain with radiculopathy   [place] [facility] Malva Limes, MD   1 year ago Type 2 diabetes mellitus with hyperlipidemia [facility])   [place] [facility] Sherrie Mustache, Demetrios Isaacs, MD

## 2024-01-31 MED ORDER — OXYCODONE HCL 5 MG PO TABS: 7.5000 mg | ORAL_TABLET | Freq: Three times a day (TID) | ORAL | 0 refills | Status: DC

## 2024-02-04 ENCOUNTER — Ambulatory Visit: Payer: Medicare Other

## 2024-02-04 VITALS — Ht 64.0 in | Wt 195.0 lb

## 2024-02-04 DIAGNOSIS — Z0000 Encounter for general adult medical examination without abnormal findings: Secondary | ICD-10-CM

## 2024-02-04 DIAGNOSIS — Z1231 Encounter for screening mammogram for malignant neoplasm of breast: Secondary | ICD-10-CM
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
## 2024-02-04 NOTE — Patient Instructions (Addendum)
 Alison Landry , Thank you for taking time to come for your Medicare Wellness Visit. I appreciate your ongoing commitment to your health goals. Please review the following plan we discussed and let me know if I can assist you in the future.   Referrals/Orders/Follow-Ups/Clinician Recommendations: Get a diabetic eye exam as soon as possible. You may call [facility] @ [phone] to schedule this appointment. I have placed an order for a mammogram. Call [facility] @ [phone] to schedule.  This is a list of the screening recommended for you and due dates:  Health Maintenance  Topic Date Due   Pneumococcal Vaccination (1 of 2 - PCV) Never done   DTaP/Tdap/Td vaccine (1 - Tdap) Never done   Eye exam for diabetics  03/17/2022   Mammogram  06/22/2023   COVID-19 Vaccine (1 - 2024-25 season) Never done   Zoster (Shingles) Vaccine (1 of 2) Never done   Flu Shot  03/01/2024*   Hemoglobin A1C  04/04/2024   Yearly kidney health urinalysis for diabetes  07/01/2024   Yearly kidney function blood test for diabetes  10/22/2024   Medicare Annual Wellness Visit  02/03/2025   Pap with HPV screening  05/29/2027   Colon Cancer Screening  03/26/2032   Hepatitis C Screening  Completed   HIV Screening  Completed   HPV Vaccine  Aged Out  *Topic was postponed. The date shown is not the original due date.    Advanced directives: (ACP Link)Information on Advanced Care Planning can be found at [facility] Advance Health Care Directives Advance Health Care Directives ([email or web address] You may also get these forms at your doctor's office.  Once you have completed the forms, please bring a copy of your health care power of attorney and living will to the office to be added to your chart at your convenience.    Next Medicare Annual Wellness Visit scheduled for next year: Yes, 02/09/25 @ 1:10pm (phone visit)  Fall Prevention in the Home, Adult Falls can cause injuries and affect people  of all ages. There are many simple things that you can do to make your home safe and to help prevent falls. If you need it, ask for help making these changes. What actions can I take to prevent falls? General information Use good lighting in all rooms. Make sure to: Replace any light bulbs that burn out. Turn on lights if it is dark and use night-lights. Keep items that you use often in easy-to-reach places. Lower the shelves around your home if needed. Move furniture so that there are clear paths around it. Do not keep throw rugs or other things on the floor that can make you trip. If any of your floors are uneven, fix them. Add color or contrast paint or tape to clearly mark and help you see: Grab bars or handrails. First and last steps of staircases. Where the edge of each step is. If you use a ladder or stepladder: Make sure that it is fully opened. Do not climb a closed ladder. Make sure the sides of the ladder are locked in place. Have someone hold the ladder while you use it. Know where your pets are as you move through your home. What can I do in the bathroom?     Keep the floor dry. Clean up any water that is on the floor right away. Remove soap buildup in the bathtub or shower. Buildup makes bathtubs and showers slippery. Use non-skid mats or decals on the  floor of the bathtub or shower. Attach bath mats securely with double-sided, non-slip rug tape. If you need to sit down while you are in the shower, use a non-slip stool. Install grab bars by the toilet and in the bathtub and shower. Do not use towel bars as grab bars. What can I do in the bedroom? Make sure that you have a light by your bed that is easy to reach. Do not use any sheets or blankets on your bed that hang to the floor. Have a firm bench or chair with side arms that you can use for support when you get dressed. What can I do in the kitchen? Clean up any spills right away. If you need to reach something  above you, use a sturdy step stool that has a grab bar. Keep electrical cables out of the way. Do not use floor polish or wax that makes floors slippery. What can I do with my stairs? Do not leave anything on the stairs. Make sure that you have a light switch at the top and the bottom of the stairs. Have them installed if you do not have them. Make sure that there are handrails on both sides of the stairs. Fix handrails that are broken or loose. Make sure that handrails are as long as the staircases. Install non-slip stair treads on all stairs in your home if they do not have carpet. Avoid having throw rugs at the top or bottom of stairs, or secure the rugs with carpet tape to prevent them from moving. Choose a carpet design that does not hide the edge of steps on the stairs. Make sure that carpet is firmly attached to the stairs. Fix any carpet that is loose or worn. What can I do on the outside of my home? Use bright outdoor lighting. Repair the edges of walkways and driveways and fix any cracks. Clear paths of anything that can make you trip, such as tools or rocks. Add color or contrast paint or tape to clearly mark and help you see high doorway thresholds. Trim any bushes or trees on the main path into your home. Check that handrails are securely fastened and in good repair. Both sides of all steps should have handrails. Install guardrails along the edges of any raised decks or porches. Have leaves, snow, and ice cleared regularly. Use sand, salt, or ice melt on walkways during winter months if you live where there is ice and snow. In the garage, clean up any spills right away, including grease or oil spills. What other actions can I take? Review your medicines with your health care provider. Some medicines can make you confused or feel dizzy. This can increase your chance of falling. Wear closed-toe shoes that fit well and support your feet. Wear shoes that have rubber soles and low  heels. Use a cane, walker, scooter, or crutches that help you move around if needed. Talk with your provider about other ways that you can decrease your risk of falls. This may include seeing a physical therapist to learn to do exercises to improve movement and strength. Where to find more information Centers for Disease Control and Prevention, STEADI: TonerPromos.no [employer] on Aging: BaseRingTones.pl National Institute on Aging: BaseRingTones.pl Contact a health care provider if: You are afraid of falling at home. You feel weak, drowsy, or dizzy at home. You fall at home. Get help right away if you: Lose consciousness or have trouble moving after a fall. Have a fall that causes  a head injury. These symptoms may be an emergency. Get help right away. Call 911. Do not wait to see if the symptoms will go away. Do not drive yourself to the hospital. This information is not intended to replace advice given to you by your health care provider. Make sure you discuss any questions you have with your health care provider. Document Revised: 07/22/2022 Document Reviewed: 07/22/2022 Elsevier Patient Education  2024 Elsevier Inc.  Managing Pain Without Opioids Opioids are strong medicines used to treat moderate to severe pain. For some people, especially those who have long-term (chronic) pain, opioids may not be the best choice for pain management due to: Side effects like nausea, constipation, and sleepiness. The risk of addiction (opioid use disorder). The longer you take opioids, the greater your risk of addiction. Pain that lasts for more than 3 months is called chronic pain. Managing chronic pain usually requires more than one approach and is often provided by a team of health care providers working together (multidisciplinary approach). Pain management may be done at a pain management center or pain clinic. How to manage pain without the use of opioids Use non-opioid medicines Non-opioid medicines  for pain may include: Over-the-counter or prescription non-steroidal anti-inflammatory drugs (NSAIDs). These may be the first medicines used for pain. They work well for muscle and bone pain, and they reduce swelling. Acetaminophen. This over-the-counter medicine may work well for milder pain but not swelling. Antidepressants. These may be used to treat chronic pain. A certain type of antidepressant (tricyclics) is often used. These medicines are given in lower doses for pain than when used for depression. Anticonvulsants. These are usually used to treat seizures but may also reduce nerve (neuropathic) pain. Muscle relaxants. These relieve pain caused by sudden muscle tightening (spasms). You may also use a pain medicine that is applied to the skin as a patch, cream, or gel (topical analgesic), such as a numbing medicine. These may cause fewer side effects than medicines taken by mouth. Do certain therapies as directed Some therapies can help with pain management. They include: Physical therapy. You will do exercises to gain strength and flexibility. A physical therapist may teach you exercises to move and stretch parts of your body that are weak, stiff, or painful. You can learn these exercises at physical therapy visits and practice them at home. Physical therapy may also involve: Massage. Heat wraps or applying heat or cold to affected areas. Electrical signals that interrupt pain signals (transcutaneous electrical nerve stimulation, TENS). Weak lasers that reduce pain and swelling (low-level laser therapy). Signals from your body that help you learn to regulate pain (biofeedback). Occupational therapy. This helps you to learn ways to function at home and work with less pain. Recreational therapy. This involves trying new activities or hobbies, such as a physical activity or drawing. Mental health therapy, including: Cognitive behavioral therapy (CBT). This helps you learn coping skills for  dealing with pain. Acceptance and commitment therapy (ACT) to change the way you think and react to pain. Relaxation therapies, including muscle relaxation exercises and mindfulness-based stress reduction. Pain management counseling. This may be individual, family, or group counseling.  Receive medical treatments Medical treatments for pain management include: Nerve block injections. These may include a pain blocker and anti-inflammatory medicines. You may have injections: Near the spine to relieve chronic back or neck pain. Into joints to relieve back or joint pain. Into nerve areas that supply a painful area to relieve body pain. Into muscles (trigger point injections)  to relieve some painful muscle conditions. A medical device placed near your spine to help block pain signals and relieve nerve pain or chronic back pain (spinal cord stimulation device). Acupuncture. Follow these instructions at home Medicines Take over-the-counter and prescription medicines only as told by your health care provider. If you are taking pain medicine, ask your health care providers about possible side effects to watch out for. Do not drive or use heavy machinery while taking prescription opioid pain medicine. Lifestyle  Do not use drugs or alcohol to reduce pain. If you drink alcohol, limit how much you have to: 0-1 drink a day for women who are not pregnant. 0-2 drinks a day for men. Know how much alcohol is in a drink. In the [place] one drink equals one 12 oz bottle of beer (355 mL), one 5 oz glass of wine (148 mL), or one 1 oz glass of hard liquor (44 mL). Do not use any products that contain nicotine or tobacco. These products include cigarettes, chewing tobacco, and vaping devices, such as e-cigarettes. If you need help quitting, ask your health care provider. Eat a healthy diet and maintain a healthy weight. Poor diet and excess weight may make pain worse. Eat foods that are high in fiber. These  include fresh fruits and vegetables, whole grains, and beans. Limit foods that are high in fat and processed sugars, such as fried and sweet foods. Exercise regularly. Exercise lowers stress and may help relieve pain. Ask your health care provider what activities and exercises are safe for you. If your health care provider approves, join an exercise class that combines movement and stress reduction. Examples include yoga and tai chi. Get enough sleep. Lack of sleep may make pain worse. Lower stress as much as possible. Practice stress reduction techniques as told by your therapist. General instructions Work with all your pain management providers to find the treatments that work best for you. You are an important member of your pain management team. There are many things you can do to reduce pain on your own. Consider joining an online or in-person support group for people who have chronic pain. Keep all follow-up visits. This is important. Where to find more information You can find more information about managing pain without opioids from: American Academy of Pain Medicine: painmed.org Institute for Chronic Pain: instituteforchronicpain.org American Chronic Pain Association: theacpa.org Contact a health care provider if: You have side effects from pain medicine. Your pain gets worse or does not get better with treatments or home therapy. You are struggling with anxiety or depression. Summary Many types of pain can be managed without opioids. Chronic pain may respond better to pain management without opioids. Pain is best managed when you and a team of health care providers work together. Pain management without opioids may include non-opioid medicines, medical treatments, physical therapy, mental health therapy, and lifestyle changes. Tell your health care providers if your pain gets worse or is not being managed well enough. This information is not intended to replace advice given to you  by your health care provider. Make sure you discuss any questions you have with your health care provider. Document Revised: 02/28/2021 Document Reviewed: 02/28/2021 Elsevier Patient Education  2024 [employer].

## 2024-02-05 ENCOUNTER — Other Ambulatory Visit: Payer: Self-pay | Admitting: Family Medicine

## 2024-02-05 DIAGNOSIS — I10 Essential (primary) hypertension: Secondary | ICD-10-CM

## 2024-02-16 ENCOUNTER — Ambulatory Visit: Payer: Medicare Other | Admitting: Family Medicine

## 2024-02-18 ENCOUNTER — Other Ambulatory Visit: Payer: Self-pay | Admitting: Family Medicine

## 2024-02-18 DIAGNOSIS — G8921 Chronic pain due to trauma: Secondary | ICD-10-CM

## 2024-02-18 NOTE — Telephone Encounter (Signed)
 Copied from CRM [id number]. Topic: Clinical - Medication Refill >> Feb 18, 2024  1:25 PM Marland Kitchen D wrote: Most Recent Primary Care Visit:  Provider: Tora Kindred  Department: [facility]  Visit Type: MEDICARE AWV, SEQUENTIAL  Date: 02/04/2024  Medication: oxyCODONE (OXY IR/ROXICODONE) 5 MG immediate release tablet  Has the patient contacted their pharmacy? No (Agent: If no, request that the patient contact the pharmacy for the refill. If patient does not wish to contact the pharmacy document the reason why and proceed with request.) (Agent: If yes, when and what did the pharmacy advise?)  Is this the correct pharmacy for this prescription? Yes If no, delete pharmacy and type the correct one.  This is the patient's preferred pharmacy:   [id number] [place], [place] - [address] AT [facility] [address] [place] [address] Phone: [phone] Fax: [phone]  [facility] - [place], [place] - [address]er [place] [address] Phone: [phone] Fax: [phone]   Has the prescription been filled recently? No  Is the patient out of the medication? Yes  Has the patient been seen for an appointment in the last year OR does the patient have an upcoming appointment? Yes  Can we respond through MyChart? Yes  Agent: Please be advised that Rx refills may take up to 3 business days. We ask that you follow-up with your pharmacy.

## 2024-02-19 NOTE — Telephone Encounter (Signed)
 Requested medication (s) are due for refill today - no  Requested medication (s) are on the active medication list -yes  Future visit scheduled -yes  Last refill: 01/31/24 #135  Notes to clinic: non delegated Rx  Requested Prescriptions  Pending Prescriptions Disp Refills   oxyCODONE (OXY IR/ROXICODONE) 5 MG immediate release tablet 135 tablet 0    Sig: Take 1.5 tablets (7.5 mg total) by mouth 3 (three) times daily.     Not Delegated - Analgesics:  Opioid Agonists Failed - 02/19/2024  9:21 AM      Failed - This refill cannot be delegated      Failed - Urine Drug Screen completed in last 360 days      Failed - Valid encounter within last 3 months    Recent Outpatient Visits           4 months ago Type 2 diabetes mellitus with hyperlipidemia [facility])   [place] [facility] Malva Limes, MD   8 months ago Type 2 diabetes mellitus with hyperlipidemia [facility])   [place] [facility] Malva Limes, MD   11 months ago Type 2 diabetes mellitus with hyperlipidemia [facility])   [place] [facility] Malva Limes, MD   1 year ago Back pain with radiculopathy   [place] [facility] Malva Limes, MD   1 year ago Type 2 diabetes mellitus with hyperlipidemia [facility])   [place] [facility] Malva Limes, MD                 Requested Prescriptions  Pending Prescriptions Disp Refills   oxyCODONE (OXY IR/ROXICODONE) 5 MG immediate release tablet 135 tablet 0    Sig: Take 1.5 tablets (7.5 mg total) by mouth 3 (three) times daily.     Not Delegated - Analgesics:  Opioid Agonists Failed - 02/19/2024  9:21 AM      Failed - This refill cannot be delegated      Failed - Urine Drug Screen completed in last 360 days      Failed - Valid encounter within last 3 months    Recent Outpatient Visits           4 months ago Type 2 diabetes mellitus with hyperlipidemia (HCC)   [place] [facility] Malva Limes, MD   8 months ago Type 2 diabetes mellitus with hyperlipidemia [facility])   [place] [facility] Malva Limes, MD   11 months ago Type 2 diabetes mellitus with hyperlipidemia [facility])   [place] [facility] Malva Limes, MD   1 year ago Back pain with radiculopathy   [place] [facility] Malva Limes, MD   1 year ago Type 2 diabetes mellitus with hyperlipidemia [facility])   [place] [facility] Sherrie Mustache, Demetrios Isaacs, MD

## 2024-02-25 ENCOUNTER — Other Ambulatory Visit: Payer: Self-pay | Admitting: Family Medicine

## 2024-02-25 DIAGNOSIS — G8921 Chronic pain due to trauma: Secondary | ICD-10-CM

## 2024-02-27 MED ORDER — OXYCODONE HCL 5 MG PO TABS: 7.5000 mg | ORAL_TABLET | Freq: Three times a day (TID) | ORAL | 0 refills | Status: DC

## 2024-03-02 ENCOUNTER — Ambulatory Visit: Admitting: Family Medicine

## 2024-03-10 ENCOUNTER — Ambulatory Visit: Admitting: Family Medicine

## 2024-03-10 ENCOUNTER — Other Ambulatory Visit: Payer: Self-pay

## 2024-03-13 ENCOUNTER — Other Ambulatory Visit: Payer: Self-pay | Admitting: Medical Genetics

## 2024-03-17 ENCOUNTER — Ambulatory Visit: Admitting: Family Medicine

## 2024-03-17 ENCOUNTER — Other Ambulatory Visit

## 2024-03-18 ENCOUNTER — Other Ambulatory Visit: Payer: Self-pay | Admitting: Family Medicine

## 2024-03-18 ENCOUNTER — Other Ambulatory Visit

## 2024-03-18 DIAGNOSIS — E781 Pure hyperglyceridemia: Secondary | ICD-10-CM

## 2024-03-18 DIAGNOSIS — E1169 Type 2 diabetes mellitus with other specified complication: Secondary | ICD-10-CM

## 2024-03-19 ENCOUNTER — Encounter: Payer: Self-pay | Admitting: Family Medicine

## 2024-03-19 ENCOUNTER — Ambulatory Visit (INDEPENDENT_AMBULATORY_CARE_PROVIDER_SITE_OTHER): Admitting: Family Medicine

## 2024-03-19 VITALS — BP 135/78 | HR 100 | Resp 16 | Ht 63.0 in | Wt 183.9 lb

## 2024-03-19 DIAGNOSIS — E559 Vitamin D deficiency, unspecified: Secondary | ICD-10-CM

## 2024-03-19 DIAGNOSIS — E538 Deficiency of other specified B group vitamins: Secondary | ICD-10-CM | POA: Diagnosis not present

## 2024-03-19 DIAGNOSIS — E781 Pure hyperglyceridemia: Secondary | ICD-10-CM

## 2024-03-19 DIAGNOSIS — E785 Hyperlipidemia, unspecified: Secondary | ICD-10-CM | POA: Diagnosis not present

## 2024-03-19 DIAGNOSIS — G8921 Chronic pain due to trauma: Secondary | ICD-10-CM

## 2024-03-19 DIAGNOSIS — E1169 Type 2 diabetes mellitus with other specified complication: Secondary | ICD-10-CM | POA: Diagnosis not present

## 2024-03-19 MED ORDER — OXYCODONE HCL 5 MG PO TABS: 7.5000 mg | ORAL_TABLET | Freq: Three times a day (TID) | ORAL | 0 refills | Status: DC

## 2024-03-19 NOTE — Progress Notes (Signed)
 Established patient visit   Patient: Alison Landry   DOB: 1971/11/22   53 y.o. Female  MRN: [id number] Visit Date: 03/19/2024  Today's healthcare provider: Jeralene Mom, MD   Chief Complaint  Patient presents with   Medical Management of Chronic Issues   Subjective    Discussed the use of AI scribe software for clinical note transcription with the patient, who gave verbal consent to proceed.  History of Present Illness   Alison Landry is a 53 year old female with diabetes who presents for follow-up on blood sugar management.  Her blood sugar levels became uncontrolled, reaching into the 400s, due to issues with her medication pens not working properly. Letting the pens sit at room temperature before use seems to resolve the issue. During the two weeks of high blood sugar, she felt very tired and drained.  She is currently using a medication that causes some gastrointestinal side effects, such as nausea and increased burping, especially when consuming sugary foods. These side effects are tolerable.  She is taking rosuvastatin  for cholesterol management but experiences severe cramps when taking it daily. Therefore, she takes it every other day, which reduces the severity of the cramps.  She is also taking a vitamin D  supplement in the form of gummies, consuming three daily, but is unsure of the exact dosage.  She has an upcoming eye doctor appointment in two weeks and has not had recent blood work done.     Lab Results  Component Value Date   HGBA1C 7.4 (A) 10/06/2023   HGBA1C 7.4 (A) 06/23/2023   HGBA1C 7.4 (A) 03/11/2023   Lab Results  Component Value Date   NA 139 10/23/2023   K 4.3 10/23/2023   CREATININE 0.62 10/23/2023   EGFR 108 10/23/2023   GLUCOSE 201 (H) 10/23/2023     Medications: Outpatient Medications Prior to Visit  Medication Sig   albuterol  (VENTOLIN  HFA) 108 (90 Base) MCG/ACT inhaler INHALE 2 PUFFS INTO THE LUNGS EVERY 6 HOURS AS NEEDED FOR  WHEEZING OR SHORTNESS OF BREATH   ALPRAZolam  (XANAX ) 0.5 MG tablet TAKE 1 TABLET(0.5 MG) BY MOUTH DAILY AS NEEDED FOR ANXIETY   amLODipine  (NORVASC ) 10 MG tablet TAKE 1 TABLET(10 MG) BY MOUTH DAILY   Continuous Blood Gluc Receiver (DEXCOM G7 RECEIVER) DEVI Use to check blood sugar as needed for insulin  dependent type 2 diabetes   Continuous Blood Gluc Transmit (DEXCOM G6 TRANSMITTER) MISC 1 Units by Does not apply route 4 (four) times daily. Use to check blood sugar four times daily. Replace after 90 days.   Continuous Glucose Sensor (DEXCOM G7 SENSOR) MISC USE TO CHECK BLOOD SUGAR AS NEEDED   cyanocobalamin  (VITAMIN B12) 1000 MCG tablet Take 1,000 mcg by mouth daily.   gabapentin  (NEURONTIN ) 600 MG tablet TAKE 1 TABLET(600 MG) BY MOUTH THREE TIMES DAILY   ibuprofen  (ADVIL ) 600 MG tablet Take 1 tablet (600 mg total) by mouth every 6 (six) hours as needed.   insulin  lispro (HUMALOG ) 100 UNIT/ML KwikPen INJECT 8 UNITS UNDER THE SKIN THREE TIMES DAILY   Insulin  Pen Needle (BD PEN NEEDLE NANO 2ND GEN) 32G X 4 MM MISC Inject 1 each into the skin 3 (three) times daily. as directed   metFORMIN  (GLUCOPHAGE -XR) 500 MG 24 hr tablet TAKE 2 TABLETS BY MOUTH TWICE DAILY WITH A MEAL   Omega-3 Fatty Acids (FISH OIL CONCENTRATE PO) Take 1 tablet by mouth daily.   ondansetron  (ZOFRAN -ODT) 4 MG disintegrating tablet DISSOLVE 1 TABLET(4 MG)  ON THE TONGUE EVERY 8 HOURS AS NEEDED FOR NAUSEA OR VOMITING   rosuvastatin  (CRESTOR ) 20 MG tablet Take 1 tablet (20 mg total) by mouth daily. (Patient taking differently: Take 20 mg by mouth every other day.)   sertraline  (ZOLOFT ) 50 MG tablet TAKE 1 TABLET(50 MG) BY MOUTH DAILY   tirzepatide (MOUNJARO) 10 MG/0.5ML Pen Inject 10 mg into the skin once a week.   tiZANidine  (ZANAFLEX ) 4 MG tablet TAKE 1 TABLET BY MOUTH EVERY 4 TO 6 HOURS AS NEEDED   traMADol  (ULTRAM ) 50 MG tablet TAKE 1 TABLET(50 MG) BY MOUTH THREE TIMES DAILY AS NEEDED   oxyCODONE  (OXY IR/ROXICODONE ) 5 MG  immediate release tablet Take 1.5 tablets (7.5 mg total) by mouth 3 (three) times daily.   No facility-administered medications prior to visit.       Objective    BP 135/78 (BP Location: Left Arm, Patient Position: Sitting, Cuff Size: Normal)   Pulse 100   Resp 16   Ht 5\' 3"  (1.6 m)   Wt 183 lb 14.4 oz (83.4 kg)   SpO2 98%   BMI 32.58 kg/m   Physical Exam   General: Appearance:    Overweight female in no acute distress  Eyes:    PERRL, conjunctiva/corneas clear, EOM's intact       Lungs:     Clear to auscultation bilaterally, respirations unlabored  Heart:    Tachycardic. Normal rhythm. No murmurs, rubs, or gallops.    MS:   All extremities are intact.    Neurologic:   Awake, alert, oriented x 3. No apparent focal neurological defect.         Assessment & Plan      1. Type 2 diabetes mellitus with hyperlipidemia (HCC) (Primary) Doing very well with current regiment. Expect A1c to be up a bit due to missing a few doses due to pen failure.  - Hemoglobin A1c  2. Hypertriglyceridemia Only tolerating rosuvastatin  QOD - CBC - Comprehensive metabolic panel with GFR - Lipid panel  3. Vitamin D  deficiency Now taking 3 gummies daily.  - VITAMIN D  25 Hydroxy (Vit-D Deficiency, Fractures)  4. B12 deficiency  - Vitamin B12  5. Chronic pain due to trauma refill oxyCODONE  (OXY IR/ROXICODONE ) 5 MG immediate release tablet; Take 1.5 tablets (7.5 mg total) by mouth 3 (three) times daily.  Dispense: 135 tablet; Refill: 0      Jeralene Mom, MD  [facility] [phone] (phone) [phone] (fax)  [facility]

## 2024-04-07 DIAGNOSIS — E119 Type 2 diabetes mellitus without complications: Secondary | ICD-10-CM | POA: Diagnosis not present

## 2024-04-07 LAB — HM DIABETES EYE EXAM

## 2024-04-08 ENCOUNTER — Other Ambulatory Visit: Payer: Self-pay | Admitting: Family Medicine

## 2024-04-21 ENCOUNTER — Other Ambulatory Visit: Payer: Self-pay | Admitting: Family Medicine

## 2024-04-21 DIAGNOSIS — G8921 Chronic pain due to trauma: Secondary | ICD-10-CM

## 2024-04-21 NOTE — Telephone Encounter (Signed)
 We still haven't gotten results of labs ordered at her o.v. last month. We need to get these results before we can approve any medication refills.

## 2024-04-21 NOTE — Telephone Encounter (Signed)
 Copied from CRM [id number]. Topic: Clinical - Medication Refill >> Apr 21, 2024 10:10 AM Phil Braun wrote: Medication: oxyCODONE  (OXY IR/ROXICODONE ) 5 MG immediate release tablet  Has the patient contacted their pharmacy? No   This is the patient's preferred pharmacy:  [facility] [address] [facility] - [place], [place] - [address] [facility] AT [facility] [address] [place] [address] Phone: [phone] Fax: [phone]   Is this the correct pharmacy for this prescription? Yes If no, delete pharmacy and type the correct one.   Has the prescription been filled recently? Yes  Is the patient out of the medication? Yes  Has the patient been seen for an appointment in the last year OR does the patient have an upcoming appointment? Yes  Can we respond through MyChart? Yes  Agent: Please be advised that Rx refills may take up to 3 business days. We ask that you follow-up with your pharmacy.

## 2024-04-22 NOTE — Telephone Encounter (Signed)
 Spoke with patient, verified identity. Made aware of provider message, patient stated she would come in tomorrow morning to have blood work done.

## 2024-04-23 DIAGNOSIS — E538 Deficiency of other specified B group vitamins: Secondary | ICD-10-CM | POA: Diagnosis not present

## 2024-04-23 DIAGNOSIS — E1169 Type 2 diabetes mellitus with other specified complication: Secondary | ICD-10-CM | POA: Diagnosis not present

## 2024-04-23 DIAGNOSIS — E781 Pure hyperglyceridemia: Secondary | ICD-10-CM | POA: Diagnosis not present

## 2024-04-23 DIAGNOSIS — E559 Vitamin D deficiency, unspecified: Secondary | ICD-10-CM | POA: Diagnosis not present

## 2024-04-23 DIAGNOSIS — E785 Hyperlipidemia, unspecified: Secondary | ICD-10-CM | POA: Diagnosis not present

## 2024-04-23 NOTE — Telephone Encounter (Signed)
 Copied from CRM [id number]. Topic: General - Other >> Apr 23, 2024  8:47 AM Elgin Grit wrote: Reason for CRM: patient walked in the office to report she completed lab work today after fasting so she is ready for refill to be sent in on  oxyCODONE  (OXY IR/ROXICODONE ) 5 MG immediate release tablet

## 2024-04-24 LAB — CBC
Hematocrit: 41 % (ref 34.0–46.6)
Hemoglobin: 13.6 g/dL (ref 11.1–15.9)
MCH: 29.3 pg (ref 26.6–33.0)
MCHC: 33.2 g/dL (ref 31.5–35.7)
MCV: 88 fL (ref 79–97)
Platelets: 310 10*3/uL (ref 150–450)
RBC: 4.64 x10E6/uL (ref 3.77–5.28)
RDW: 12.8 % (ref 11.7–15.4)
WBC: 7.1 10*3/uL (ref 3.4–10.8)

## 2024-04-24 LAB — COMPREHENSIVE METABOLIC PANEL WITH GFR
ALT: 34 IU/L — ABNORMAL HIGH (ref 0–32)
AST: 23 IU/L (ref 0–40)
Albumin: 4.4 g/dL (ref 3.9–4.9)
Alkaline Phosphatase: 118 IU/L (ref 44–121)
BUN/Creatinine Ratio: 16 (ref 9–23)
BUN: 10 mg/dL (ref 6–24)
Bilirubin Total: 0.2 mg/dL (ref 0.0–1.2)
CO2: 20 mmol/L (ref 20–29)
Calcium: 9.6 mg/dL (ref 8.7–10.2)
Chloride: 101 mmol/L (ref 96–106)
Creatinine, Ser: 0.63 mg/dL (ref 0.57–1.00)
Globulin, Total: 2.7 g/dL (ref 1.5–4.5)
Glucose: 153 mg/dL — ABNORMAL HIGH (ref 70–99)
Potassium: 4.1 mmol/L (ref 3.5–5.2)
Sodium: 141 mmol/L (ref 134–144)
Total Protein: 7.1 g/dL (ref 6.0–8.5)
eGFR: 108 mL/min/{1.73_m2} (ref >59)

## 2024-04-24 LAB — VITAMIN D 25 HYDROXY (VIT D DEFICIENCY, FRACTURES): Vit D, 25-Hydroxy: 25.9 ng/mL — ABNORMAL LOW (ref 30.0–100.0)

## 2024-04-24 LAB — LIPID PANEL
Chol/HDL Ratio: 5.7 ratio — ABNORMAL HIGH (ref 0.0–4.4)
Cholesterol, Total: 210 mg/dL — ABNORMAL HIGH (ref 100–199)
HDL: 37 mg/dL — ABNORMAL LOW (ref >39)
LDL Chol Calc (NIH): 116 mg/dL — ABNORMAL HIGH (ref 0–99)
Triglycerides: 330 mg/dL — ABNORMAL HIGH (ref 0–149)
VLDL Cholesterol Cal: 57 mg/dL — ABNORMAL HIGH (ref 5–40)

## 2024-04-24 LAB — HEMOGLOBIN A1C
Est. average glucose Bld gHb Est-mCnc: 160 mg/dL
Hgb A1c MFr Bld: 7.2 % — ABNORMAL HIGH (ref 4.8–5.6)

## 2024-04-24 LAB — VITAMIN B12: Vitamin B-12: 292 pg/mL (ref 232–1245)

## 2024-04-24 MED ORDER — OXYCODONE HCL 5 MG PO TABS: 7.5000 mg | ORAL_TABLET | Freq: Three times a day (TID) | ORAL | 0 refills | Status: DC

## 2024-04-24 NOTE — Addendum Note (Signed)
 Addended by: Lamon Pillow on: 04/24/2024 09:03 AM   Modules accepted: Orders

## 2024-04-29 ENCOUNTER — Ambulatory Visit: Payer: Self-pay | Admitting: Family Medicine

## 2024-05-10 ENCOUNTER — Other Ambulatory Visit: Payer: Self-pay | Admitting: Family Medicine

## 2024-05-10 DIAGNOSIS — E781 Pure hyperglyceridemia: Secondary | ICD-10-CM

## 2024-05-10 NOTE — Telephone Encounter (Unsigned)
 Copied from CRM [id number]. Topic: Clinical - Medication Refill >> May 10, 2024 10:39 AM Baldomero Bone wrote: Medication: tirzepatide (MOUNJARO) 10 MG/0.5ML Pen  Has the patient contacted their pharmacy? Yes (Agent: If no, request that the patient contact the pharmacy for the refill. If patient does not wish to contact the pharmacy document the reason why and proceed with request.) (Agent: If yes, when and what did the pharmacy advise?)cannot fax refill  This is the patient's preferred pharmacy:   [facility] DR[facility]ORE #[id number] [place], [place] - [address] AT [facility] OF [address] [place] [address] Phone: [phone] Fax: [phone]    Is this the correct pharmacy for this prescription? Yes If no, delete pharmacy and type the correct one.   Has the prescription been filled recently? No  Is the patient out of the medication? Yes  Has the patient been seen for an appointment in the last year OR does the patient have an upcoming appointment? Yes  Can we respond through MyChart? No  Agent: Please be advised that Rx refills may take up to 3 business days. We ask that you follow-up with your pharmacy.

## 2024-05-11 MED ORDER — TIRZEPATIDE 10 MG/0.5ML ~~LOC~~ SOAJ: 10.0000 mg | SUBCUTANEOUS | 0 refills | Status: DC

## 2024-05-11 NOTE — Telephone Encounter (Signed)
 Requested medication (s) are due for refill today: na   Requested medication (s) are on the active medication list: yes   Last refill:  12/04/23 #6 ml 1 refill  Future visit scheduled: no   Notes to clinic:  medication not assigned to a protocol. Do you want to refill Rx?     Requested Prescriptions  Pending Prescriptions Disp Refills   tirzepatide (MOUNJARO) 10 MG/0.5ML Pen 6 mL 1    Sig: Inject 10 mg into the skin once a week.     Off-Protocol Failed - 05/11/2024 11:34 AM      Failed - Medication not assigned to a protocol, review manually.      Passed - Valid encounter within last 12 months    Recent Outpatient Visits           1 month ago Type 2 diabetes mellitus with hyperlipidemia [facility])   [facility] Shann Darnel, Erlinda Haws, MD

## 2024-05-20 ENCOUNTER — Other Ambulatory Visit: Payer: Self-pay | Admitting: Family Medicine

## 2024-05-21 ENCOUNTER — Telehealth: Payer: Self-pay

## 2024-05-21 NOTE — Telephone Encounter (Signed)
 Copied from CRM [id number]. Topic: Clinical - Medication Question >> May 21, 2024 12:49 PM Donald Frost wrote: Reason for CRM: Phuong clinical pharmacist with [facility] called in concerning possible medication interaction. These medications are ALPRAZolam  (XANAX ) 0.5 MG tablet and oxyCODONE  (OXY IR/ROXICODONE ) 5 MG immediate release tablet. She said there is the increase risk of respiratory depression and sedation and cognitive impairment. She said she is up for review for deprescribing. She will also fax over a summary of the discussion. Please assist furthter

## 2024-05-24 ENCOUNTER — Other Ambulatory Visit: Payer: Self-pay | Admitting: Family Medicine

## 2024-05-24 DIAGNOSIS — G8921 Chronic pain due to trauma: Secondary | ICD-10-CM

## 2024-05-24 NOTE — Telephone Encounter (Unsigned)
 Copied from CRM [id number]. Topic: Clinical - Medication Refill >> May 24, 2024 11:28 AM Antwanette L wrote: Medication: oxyCODONE  (OXY IR/ROXICODONE ) 5 MG immediate release tablet  Has the patient contacted their pharmacy? Yes   This is the patient's preferred pharmacy:  [facility] [address] [facility] - [place], [place] - [address] [facility] [address] [facility] [address] [place] [address] Phone: [phone] Fax: [phone]   Is this the correct pharmacy for this prescription? Yes   Has the prescription been filled recently? Yes. Last refilled on 04/24/24  Is the patient out of the medication? No. Patient has a day or two worth of medicine  Has the patient been seen for an appointment in the last year OR does the patient have an upcoming appointment? Yes. Last ov with Dr. Gasper was on 03/19/24   Can we respond through MyChart? No. Contact patient by phone at [phone]  Agent: Please be advised that Rx refills may take up to 3 business days. We ask that you follow-up with your pharmacy.

## 2024-05-24 NOTE — Telephone Encounter (Unsigned)
 Copied from CRM [id number]. Topic: Clinical - Medication Refill >> May 24, 2024  2:30 PM Ameerah G wrote: Medication: ALPRAZolam  (XANAX ) 0.5 MG tablet [[id number]]  Has the patient contacted their pharmacy? Yes (Agent: If no, request that the patient contact the pharmacy for the refill. If patient does not wish to contact the pharmacy document the reason why and proceed with request.) (Agent: If yes, when and what did the pharmacy advise?)  This is the patient's preferred pharmacy:   [facility] DRUG STORE #[id number] [place], [place] - [address] AT [facility] OF [address] [place] [address] Phone: [phone] Fax: [phone]  Is this the correct pharmacy for this prescription? Yes If no, delete pharmacy and type the correct one.   Has the prescription been filled recently? No  Is the patient out of the medication? Yes  Has the patient been seen for an appointment in the last year OR does the patient have an upcoming appointment? Yes  Can we respond through MyChart? Yes  Agent: Please be advised that Rx refills may take up to 3 business days. We ask that you follow-up with your pharmacy.

## 2024-05-25 ENCOUNTER — Other Ambulatory Visit

## 2024-05-25 ENCOUNTER — Other Ambulatory Visit
Admission: RE | Admit: 2024-05-25 | Discharge: 2024-05-25 | Disposition: A | Discharge status: Home / Self Care | Payer: Self-pay | Source: Ambulatory Visit | Attending: Medical Genetics | Admitting: Medical Genetics

## 2024-05-26 NOTE — Telephone Encounter (Signed)
 Requested medication (s) are due for refill today: yes  Requested medication (s) are on the active medication list: yes  Last refill:  04/24/24 #135/0  Future visit scheduled: no  Notes to clinic:  Unable to refill per protocol, cannot delegate.      Requested Prescriptions  Pending Prescriptions Disp Refills   oxyCODONE  (OXY IR/ROXICODONE ) 5 MG immediate release tablet 135 tablet 0    Sig: Take 1.5 tablets (7.5 mg total) by mouth 3 (three) times daily.     Not Delegated - Analgesics:  Opioid Agonists Failed - 05/26/2024  8:34 AM      Failed - This refill cannot be delegated      Failed - Urine Drug Screen completed in last 360 days      Failed - Valid encounter within last 3 months    Recent Outpatient Visits           2 months ago Type 2 diabetes mellitus with hyperlipidemia [facility])   [facility] Gasper, Nancyann BRAVO, MD

## 2024-05-26 NOTE — Telephone Encounter (Signed)
 Requested medication (s) are due for refill today: na   Requested medication (s) are on the active medication list: yes   Last refill:  01/20/24 #30 3 refills  Future visit scheduled: no   Notes to clinic:  not delegated per protocol. Do you want to refill Rx?     Requested Prescriptions  Pending Prescriptions Disp Refills   ALPRAZolam  (XANAX ) 0.5 MG tablet 30 tablet 3     Not Delegated - Psychiatry: Anxiolytics/Hypnotics 2 Failed - 05/26/2024 10:47 AM      Failed - This refill cannot be delegated      Failed - Urine Drug Screen completed in last 360 days      Passed - Patient is not pregnant      Passed - Valid encounter within last 6 months    Recent Outpatient Visits           2 months ago Type 2 diabetes mellitus with hyperlipidemia [facility])    [facility] Gasper, Nancyann BRAVO, MD

## 2024-05-27 MED ORDER — OXYCODONE HCL 5 MG PO TABS: 7.5000 mg | ORAL_TABLET | Freq: Three times a day (TID) | ORAL | 0 refills | Status: DC

## 2024-05-27 MED ORDER — ALPRAZOLAM 0.5 MG PO TABS: 0.5000 mg | ORAL_TABLET | Freq: Every day | ORAL | 1 refills | Status: DC | PRN

## 2024-06-04 LAB — GENECONNECT MOLECULAR SCREEN: Genetic Analysis Overall Interpretation: NEGATIVE

## 2024-06-16 ENCOUNTER — Other Ambulatory Visit: Payer: Self-pay | Admitting: Family Medicine

## 2024-06-16 DIAGNOSIS — M25541 Pain in joints of right hand: Secondary | ICD-10-CM

## 2024-06-23 ENCOUNTER — Other Ambulatory Visit: Payer: Self-pay | Admitting: Family Medicine

## 2024-06-23 DIAGNOSIS — G8921 Chronic pain due to trauma: Secondary | ICD-10-CM

## 2024-06-23 DIAGNOSIS — E781 Pure hyperglyceridemia: Secondary | ICD-10-CM

## 2024-06-23 NOTE — Telephone Encounter (Signed)
 Copied from CRM [id number]. Topic: Clinical - Medication Refill >> Jun 23, 2024 10:38 AM Wess RAMAN wrote: Medication: oxyCODONE  (OXY IR/ROXICODONE ) 5 MG immediate release tablet   Has the patient contacted their pharmacy? Yes (Agent: If no, request that the patient contact the pharmacy for the refill. If patient does not wish to contact the pharmacy document the reason why and proceed with request.) (Agent: If yes, when and what did the pharmacy advise?) Can't fill controlled substance  This is the patient's preferred pharmacy:  [facility] [address] [facility] - [place], [place] - [address] [facility] AT [facility] [address] [place] [address] Phone: [phone] Fax: [phone]  Is this the correct pharmacy for this prescription? Yes If no, delete pharmacy and type the correct one.   Has the prescription been filled recently? Yes  Is the patient out of the medication? Yes  Has the patient been seen for an appointment in the last year OR does the patient have an upcoming appointment? Yes  Can we respond through MyChart? Yes  Agent: Please be advised that Rx refills may take up to 3 business days. We ask that you follow-up with your pharmacy.

## 2024-06-25 MED ORDER — OXYCODONE HCL 5 MG PO TABS: 7.5000 mg | ORAL_TABLET | Freq: Three times a day (TID) | ORAL | 0 refills | Status: DC

## 2024-06-25 NOTE — Telephone Encounter (Signed)
 Requested medication (s) are due for refill today: yes  Requested medication (s) are on the active medication list: yes  Last refill:  05/27/24  Future visit scheduled: no  Notes to clinic:  Unable to refill per protocol, cannot delegate.      Requested Prescriptions  Pending Prescriptions Disp Refills   oxyCODONE  (OXY IR/ROXICODONE ) 5 MG immediate release tablet 135 tablet 0    Sig: Take 1.5 tablets (7.5 mg total) by mouth 3 (three) times daily.     Not Delegated - Analgesics:  Opioid Agonists Failed - 06/25/2024  8:49 AM      Failed - This refill cannot be delegated      Failed - Urine Drug Screen completed in last 360 days      Failed - Valid encounter within last 3 months    Recent Outpatient Visits           3 months ago Type 2 diabetes mellitus with hyperlipidemia [facility])   [facility] Gasper, Nancyann BRAVO, MD

## 2024-06-27 ENCOUNTER — Other Ambulatory Visit: Payer: Self-pay | Admitting: Family Medicine

## 2024-07-26 ENCOUNTER — Other Ambulatory Visit: Payer: Self-pay | Admitting: Family Medicine

## 2024-07-26 DIAGNOSIS — G8921 Chronic pain due to trauma: Secondary | ICD-10-CM

## 2024-07-26 NOTE — Telephone Encounter (Unsigned)
 Copied from CRM #[id number]. Topic: Clinical - Medication Refill >> Jul 26, 2024  9:21 AM Fonda T wrote: Medication: oxyCODONE  (OXY IR/ROXICODONE ) 5 MG immediate release tablet  Has the patient contacted their pharmacy? Yes, advised to contact office   This is the patient's preferred pharmacy:  [facility] [address] [facility] - [place], [place] - [address] [facility] [address] [facility] [address] [place] [address] Phone: [phone] Fax: [phone]   Is this the correct pharmacy for this prescription? Yes If no, delete pharmacy and type the correct one.   Has the prescription been filled recently? Yes  Is the patient out of the medication? Yes  Has the patient been seen for an appointment in the last year OR does the patient have an upcoming appointment? Yes  Can we respond through MyChart? No, prefers a phone call   Agent: Please be advised that Rx refills may take up to 3 business days. We ask that you follow-up with your pharmacy.

## 2024-07-27 NOTE — Telephone Encounter (Signed)
 Requested medication (s) are due for refill today - no  Requested medication (s) are on the active medication list -yes  Future visit scheduled -no  Last refill: 06/25/24 #135  Notes to clinic: non delegated Rx  Requested Prescriptions  Pending Prescriptions Disp Refills   oxyCODONE  (OXY IR/ROXICODONE ) 5 MG immediate release tablet 135 tablet 0    Sig: Take 1.5 tablets (7.5 mg total) by mouth 3 (three) times daily.     Not Delegated - Analgesics:  Opioid Agonists Failed - 07/27/2024  1:54 PM      Failed - This refill cannot be delegated      Failed - Urine Drug Screen completed in last 360 days      Failed - Valid encounter within last 3 months    Recent Outpatient Visits           4 months ago Type 2 diabetes mellitus with hyperlipidemia (HCC)   [facility] Gasper Nancyann BRAVO, MD                 Requested Prescriptions  Pending Prescriptions Disp Refills   oxyCODONE  (OXY IR/ROXICODONE ) 5 MG immediate release tablet 135 tablet 0    Sig: Take 1.5 tablets (7.5 mg total) by mouth 3 (three) times daily.     Not Delegated - Analgesics:  Opioid Agonists Failed - 07/27/2024  1:54 PM      Failed - This refill cannot be delegated      Failed - Urine Drug Screen completed in last 360 days      Failed - Valid encounter within last 3 months    Recent Outpatient Visits           4 months ago Type 2 diabetes mellitus with hyperlipidemia [facility])   [facility] Gasper, Nancyann BRAVO, MD

## 2024-07-28 MED ORDER — OXYCODONE HCL 5 MG PO TABS: 7.5000 mg | ORAL_TABLET | Freq: Three times a day (TID) | ORAL | 0 refills | Status: DC

## 2024-07-31 ENCOUNTER — Other Ambulatory Visit: Payer: Self-pay | Admitting: Family Medicine

## 2024-08-01 ENCOUNTER — Other Ambulatory Visit: Payer: Self-pay | Admitting: Family Medicine

## 2024-08-03 ENCOUNTER — Other Ambulatory Visit: Payer: Self-pay

## 2024-08-03 ENCOUNTER — Telehealth: Payer: Self-pay

## 2024-08-03 MED ORDER — FLUCONAZOLE 150 MG PO TABS: 150.0000 mg | ORAL_TABLET | Freq: Once | ORAL | 0 refills | Status: AC

## 2024-08-03 NOTE — Telephone Encounter (Signed)
 Not on patient med list.     Copied from CRM [id number]. Topic: Clinical - Medication Question >> Aug 03, 2024 10:33 AM Carlatta H wrote: Reason for CRM: Patient would like a Prescription call in for Diflucan  prescription called in to the [facility]ORE #[id number] [place], [place] - [address] AT [facility] OF [address] [place] [address] Phone: [phone] Fax: [phone] Hours: Not open 24 hours

## 2024-08-03 NOTE — Addendum Note (Signed)
 Addended by: GASPER NANCYANN BRAVO on: 08/03/2024 01:12 PM   Modules accepted: Orders

## 2024-08-24 ENCOUNTER — Other Ambulatory Visit: Payer: Self-pay | Admitting: Family Medicine

## 2024-08-24 DIAGNOSIS — G8921 Chronic pain due to trauma: Secondary | ICD-10-CM

## 2024-08-24 NOTE — Telephone Encounter (Unsigned)
 Copied from CRM #[id number]. Topic: Clinical - Medication Refill >> Aug 24, 2024  1:20 PM Antwanette L wrote: Medication: oxyCODONE  (OXY IR/ROXICODONE ) 5 MG immediate release tablet  Has the patient contacted their pharmacy? Yes  This is the patient's preferred pharmacy:  [facility] [address] [facility] - [place], [place] - [address] [facility] AT [facility] [address] [place] [address] Phone: [phone] Fax: [phone]   Is this the correct pharmacy for this prescription? Yes   Has the prescription been filled recently? Yes. Last refilled was on 07/28/24  Is the patient out of the medication? No. Patient only has 2 days worth of medicine  Has the patient been seen for an appointment in the last year OR does the patient have an upcoming appointment? Yes. Last ov w/ Dr. Gasper was on 03/19/24  Can we respond through MyChart? No. Contact the patient by phone at [phone]  Agent: Please be advised that Rx refills may take up to 3 business days. We ask that you follow-up with your pharmacy.

## 2024-08-25 NOTE — Telephone Encounter (Signed)
 Requested medication (s) are due for refill today: yes  Requested medication (s) are on the active medication list: yes  Last refill:  07/28/24 #135  Future visit scheduled: yes  Notes to clinic:  med not delegated to NT to RF   Requested Prescriptions  Pending Prescriptions Disp Refills   oxyCODONE  (OXY IR/ROXICODONE ) 5 MG immediate release tablet 135 tablet 0    Sig: Take 1.5 tablets (7.5 mg total) by mouth 3 (three) times daily.     Not Delegated - Analgesics:  Opioid Agonists Failed - 08/25/2024  2:06 PM      Failed - This refill cannot be delegated      Failed - Urine Drug Screen completed in last 360 days      Failed - Valid encounter within last 3 months    Recent Outpatient Visits           5 months ago Type 2 diabetes mellitus with hyperlipidemia [facility])   [facility] Gasper Nancyann BRAVO, MD

## 2024-08-30 ENCOUNTER — Other Ambulatory Visit: Payer: Self-pay | Admitting: Family Medicine

## 2024-09-09 ENCOUNTER — Other Ambulatory Visit: Payer: Self-pay | Admitting: Family Medicine

## 2024-09-09 DIAGNOSIS — E781 Pure hyperglyceridemia: Secondary | ICD-10-CM

## 2024-09-21 ENCOUNTER — Other Ambulatory Visit: Payer: Self-pay | Admitting: Family Medicine

## 2024-09-21 DIAGNOSIS — E781 Pure hyperglyceridemia: Secondary | ICD-10-CM

## 2024-09-21 DIAGNOSIS — G8921 Chronic pain due to trauma: Secondary | ICD-10-CM

## 2024-09-21 NOTE — Telephone Encounter (Signed)
 Copied from CRM [id number]. Topic: Clinical - Medication Refill >> Sep 21, 2024  1:49 PM Sophia H wrote: Medication: tirzepatide  (MOUNJARO ) 10 MG/0.5ML Pen   Has the patient contacted their pharmacy? Yes, told to contact office. For some reason pharmacy requested fill on ozempic  which was not correct.   This is the patient's preferred pharmacy:    [facility] DR[facility] STORE #[id number] [place], [place] - [address] AT [facility] OF [address] [place] [address] Phone: [phone] Fax: [phone]  Is this the correct pharmacy for this prescription? Yes If no, delete pharmacy and type the correct one.   Has the prescription been filled recently? Yes  Is the patient out of the medication? Yes  Has the patient been seen for an appointment in the last year OR does the patient have an upcoming appointment? Yes, seen 03/2024 for A1C  Can we respond through MyChart? No, prefers phone call.  Agent: Please be advised that Rx refills may take up to 3 business days. We ask that you follow-up with your pharmacy.

## 2024-09-21 NOTE — Telephone Encounter (Signed)
 Copied from CRM #[id number]. Topic: Clinical - Medication Refill >> Sep 21, 2024  1:46 PM Sophia H wrote: Medication: oxyCODONE  (OXY IR/ROXICODONE ) 5 MG immediate release tablet   Has the patient contacted their pharmacy? Yes,  has to contact office.  This is the patient's preferred pharmacy:  [facility] [address] [facility] - [place], [place] - [address] [facility] AT [facility] [address] [place] [address] Phone: [phone] Fax: [phone]  Is this the correct pharmacy for this prescription? Yes If no, delete pharmacy and type the correct one.   Has the prescription been filled recently? Yes  Is the patient out of the medication? No, has 2 days left.   Has the patient been seen for an appointment in the last year OR does the patient have an upcoming appointment? Yes, last seen 03/2024.  Can we respond through MyChart? Yes  Agent: Please be advised that Rx refills may take up to 3 business days. We ask that you follow-up with your pharmacy.

## 2024-09-23 ENCOUNTER — Other Ambulatory Visit: Payer: Self-pay | Admitting: Family Medicine

## 2024-09-23 DIAGNOSIS — G8921 Chronic pain due to trauma: Secondary | ICD-10-CM

## 2024-09-23 DIAGNOSIS — E781 Pure hyperglyceridemia: Secondary | ICD-10-CM

## 2024-09-23 NOTE — Telephone Encounter (Signed)
 Requested medications are due for refill today.  yes  Requested medications are on the active medications list.  yes  Last refill. 07/28/2024 #135 0 rf  Future visit scheduled.   yes  Notes to clinic.  Refill not delegated.    Requested Prescriptions  Pending Prescriptions Disp Refills   oxyCODONE  (OXY IR/ROXICODONE ) 5 MG immediate release tablet 135 tablet 0    Sig: Take 1.5 tablets (7.5 mg total) by mouth 3 (three) times daily.     Not Delegated - Analgesics:  Opioid Agonists Failed - 09/23/2024 12:25 PM      Failed - This refill cannot be delegated      Failed - Urine Drug Screen completed in last 360 days      Failed - Valid encounter within last 3 months    Recent Outpatient Visits           6 months ago Type 2 diabetes mellitus with hyperlipidemia [facility])   [facility] Gasper Nancyann BRAVO, MD

## 2024-09-23 NOTE — Telephone Encounter (Signed)
 Requested medications are due for refill today.  yes  Requested medications are on the active medications list.  yes  Last refill. 09/09/2024 2mL 0 rf  Future visit scheduled.   yes  Notes to clinic.  Medication not assigned to a protocol. Please review for refill.    Requested Prescriptions  Pending Prescriptions Disp Refills   tirzepatide  (MOUNJARO ) 10 MG/0.5ML Pen 2 mL 0     Off-Protocol Failed - 09/23/2024 12:26 PM      Failed - Medication not assigned to a protocol, review manually.      Passed - Valid encounter within last 12 months    Recent Outpatient Visits           6 months ago Type 2 diabetes mellitus with hyperlipidemia [facility])   [facility] Gasper, Nancyann BRAVO, MD

## 2024-09-23 NOTE — Telephone Encounter (Unsigned)
 Copied from CRM [id number]. Topic: Clinical - Prescription Issue >> Sep 23, 2024 10:27 AM Tiffini S wrote: Reason for CRM: Patient called stating that she is completely out of her medication requested on 09/21/24 for oxyCODONE  (OXY IR/ROXICODONE ) 5 MG immediate release tablet and tirzepatide  (MOUNJARO ) 10 MG/0.5ML Pen- scheduled appointment for 10/15/24- waitlist She is asking for a medication refill until the appointment   oxyCODONE  (OXY IR/ROXICODONE ) 5 MG  [facility] [address]  [address] [place] [address]   tirzepatide  (MOUNJARO ) 10 MG/0.5ML Pen [facility] #[id number]   [address]  [place] [address]   Please call the patient at [phone] for update

## 2024-09-24 MED ORDER — OXYCODONE HCL 5 MG PO TABS: 7.5000 mg | ORAL_TABLET | Freq: Three times a day (TID) | ORAL | 0 refills | Status: DC

## 2024-09-24 NOTE — Telephone Encounter (Signed)
 Requested medications are due for refill today.  no  Requested medications are on the active medications list.  yes  Last refill. Oxycodone  09/24/2024  Future visit scheduled.   yes  Notes to clinic.  Refill not delegated.    Requested Prescriptions  Pending Prescriptions Disp Refills   tirzepatide  (MOUNJARO ) 10 MG/0.5ML Pen 2 mL 0     Off-Protocol Failed - 09/24/2024  3:20 PM      Failed - Medication not assigned to a protocol, review manually.      Passed - Valid encounter within last 12 months    Recent Outpatient Visits           6 months ago Type 2 diabetes mellitus with hyperlipidemia (HCC)   [place] [facility] Alison Nancyann BRAVO, MD               oxyCODONE  (OXY IR/ROXICODONE ) 5 MG immediate release tablet 135 tablet 0    Sig: Take 1.5 tablets (7.5 mg total) by mouth 3 (three) times daily.     Not Delegated - Analgesics:  Opioid Agonists Failed - 09/24/2024  3:20 PM      Failed - This refill cannot be delegated      Failed - Urine Drug Screen completed in last 360 days      Failed - Valid encounter within last 3 months    Recent Outpatient Visits           6 months ago Type 2 diabetes mellitus with hyperlipidemia [facility])   [facility] Alison, Nancyann BRAVO, MD

## 2024-09-27 ENCOUNTER — Telehealth (INDEPENDENT_AMBULATORY_CARE_PROVIDER_SITE_OTHER): Admitting: Family Medicine

## 2024-09-27 DIAGNOSIS — E781 Pure hyperglyceridemia: Secondary | ICD-10-CM | POA: Diagnosis not present

## 2024-09-27 DIAGNOSIS — I10 Essential (primary) hypertension: Secondary | ICD-10-CM | POA: Diagnosis not present

## 2024-09-27 DIAGNOSIS — G8929 Other chronic pain: Secondary | ICD-10-CM | POA: Diagnosis not present

## 2024-09-27 DIAGNOSIS — E559 Vitamin D deficiency, unspecified: Secondary | ICD-10-CM

## 2024-09-27 DIAGNOSIS — E538 Deficiency of other specified B group vitamins: Secondary | ICD-10-CM

## 2024-09-27 DIAGNOSIS — E1169 Type 2 diabetes mellitus with other specified complication: Secondary | ICD-10-CM

## 2024-09-27 DIAGNOSIS — G909 Disorder of the autonomic nervous system, unspecified: Secondary | ICD-10-CM

## 2024-09-27 DIAGNOSIS — Z7985 Long-term (current) use of injectable non-insulin antidiabetic drugs: Secondary | ICD-10-CM | POA: Diagnosis not present

## 2024-09-27 DIAGNOSIS — F410 Panic disorder [episodic paroxysmal anxiety] without agoraphobia: Secondary | ICD-10-CM

## 2024-09-27 DIAGNOSIS — E785 Hyperlipidemia, unspecified: Secondary | ICD-10-CM | POA: Diagnosis not present

## 2024-09-27 DIAGNOSIS — F1190 Opioid use, unspecified, uncomplicated: Secondary | ICD-10-CM

## 2024-09-27 MED ORDER — MOUNJARO 10 MG/0.5ML ~~LOC~~ SOAJ: 10.0000 mg | SUBCUTANEOUS | 1 refills | Status: AC
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
## 2024-09-29 NOTE — Progress Notes (Signed)
 Alison Landry                                          MRN: [id number]   09/29/2024   The VBCI Quality Team Specialist reviewed this patient medical record for the purposes of chart review for care gap closure. The following were reviewed: abstraction for care gap closure-glycemic status assessment.    VBCI Quality Team

## 2024-09-29 NOTE — Progress Notes (Signed)
 Ifeoma Vallin                                          MRN: [id number]   09/29/2024   The VBCI Quality Team Specialist reviewed this patient medical record for the purposes of chart review for care gap closure. The following were reviewed: chart review for care gap closure-kidney health evaluation for diabetes:eGFR  and uACR.    VBCI Quality Team

## 2024-09-30 ENCOUNTER — Other Ambulatory Visit: Payer: Self-pay | Admitting: Family Medicine

## 2024-09-30 DIAGNOSIS — K2100 Gastro-esophageal reflux disease with esophagitis, without bleeding: Secondary | ICD-10-CM

## 2024-10-14 ENCOUNTER — Other Ambulatory Visit: Payer: Self-pay | Admitting: Family Medicine

## 2024-10-15 ENCOUNTER — Ambulatory Visit: Admitting: Family Medicine

## 2024-10-19 ENCOUNTER — Ambulatory Visit: Payer: Self-pay | Admitting: Family Medicine

## 2024-10-19 DIAGNOSIS — E781 Pure hyperglyceridemia: Secondary | ICD-10-CM

## 2024-10-19 DIAGNOSIS — E7800 Pure hypercholesterolemia, unspecified: Secondary | ICD-10-CM

## 2024-10-19 MED ORDER — EZETIMIBE 10 MG PO TABS: 10.0000 mg | ORAL_TABLET | Freq: Every day | ORAL | 1 refills | Status: AC

## 2024-10-20 ENCOUNTER — Other Ambulatory Visit: Payer: Self-pay | Admitting: Family Medicine

## 2024-10-20 DIAGNOSIS — G8921 Chronic pain due to trauma: Secondary | ICD-10-CM

## 2024-10-20 LAB — COMPREHENSIVE METABOLIC PANEL WITH GFR
ALT: 23 IU/L (ref 0–32)
AST: 19 IU/L (ref 0–40)
Albumin: 4.6 g/dL (ref 3.8–4.9)
Alkaline Phosphatase: 141 IU/L — ABNORMAL HIGH (ref 49–135)
BUN/Creatinine Ratio: 22 (ref 9–23)
BUN: 13 mg/dL (ref 6–24)
Bilirubin Total: 0.3 mg/dL (ref 0.0–1.2)
CO2: 23 mmol/L (ref 20–29)
Calcium: 9.9 mg/dL (ref 8.7–10.2)
Chloride: 97 mmol/L (ref 96–106)
Creatinine, Ser: 0.6 mg/dL (ref 0.57–1.00)
Globulin, Total: 3.3 g/dL (ref 1.5–4.5)
Glucose: 209 mg/dL — ABNORMAL HIGH (ref 70–99)
Potassium: 4 mmol/L (ref 3.5–5.2)
Sodium: 138 mmol/L (ref 134–144)
Total Protein: 7.9 g/dL (ref 6.0–8.5)
eGFR: 109 mL/min/1.73 (ref >59)

## 2024-10-20 LAB — LIPID PANEL
Chol/HDL Ratio: 5.8 ratio — ABNORMAL HIGH (ref 0.0–4.4)
Cholesterol, Total: 232 mg/dL — ABNORMAL HIGH (ref 100–199)
HDL: 40 mg/dL (ref >39)
LDL Chol Calc (NIH): 133 mg/dL — ABNORMAL HIGH (ref 0–99)
Triglycerides: 326 mg/dL — ABNORMAL HIGH (ref 0–149)
VLDL Cholesterol Cal: 59 mg/dL — ABNORMAL HIGH (ref 5–40)

## 2024-10-20 LAB — VITAMIN B12: Vitamin B-12: 333 pg/mL (ref 232–1245)

## 2024-10-20 LAB — PAIN MGT SCRN (14 DRUGS), UR
Amphetamine Scrn, Ur: NEGATIVE ng/mL
BARBITURATE SCREEN URINE: NEGATIVE ng/mL
BENZODIAZEPINE SCREEN, URINE: NEGATIVE ng/mL
Buprenorphine, Urine: NEGATIVE ng/mL
CANNABINOIDS UR QL SCN: NEGATIVE ng/mL
Cocaine (Metab) Scrn, Ur: NEGATIVE ng/mL
Creatinine(Crt), U: 110.9 mg/dL (ref 20.0–300.0)
Fentanyl, Urine: NEGATIVE pg/mL
Meperidine Screen, Urine: NEGATIVE ng/mL
Methadone Screen, Urine: NEGATIVE ng/mL
OXYCODONE+OXYMORPHONE UR QL SCN: POSITIVE ng/mL — AB
Opiate Scrn, Ur: NEGATIVE ng/mL
Ph of Urine: 5.4 (ref 4.5–8.9)
Phencyclidine Qn, Ur: NEGATIVE ng/mL
Propoxyphene Scrn, Ur: NEGATIVE ng/mL
Tramadol Screen, Urine: NEGATIVE ng/mL

## 2024-10-20 LAB — MICROALBUMIN / CREATININE URINE RATIO
Creatinine, Urine: 119.8 mg/dL
Microalb/Creat Ratio: 10 mg/g{creat} (ref 0–29)
Microalbumin, Urine: 12.1 ug/mL

## 2024-10-20 LAB — HEMOGLOBIN A1C
Est. average glucose Bld gHb Est-mCnc: 189 mg/dL
Hgb A1c MFr Bld: 8.2 % — ABNORMAL HIGH (ref 4.8–5.6)

## 2024-10-20 LAB — CBC
Hematocrit: 43 % (ref 34.0–46.6)
Hemoglobin: 14.1 g/dL (ref 11.1–15.9)
MCH: 29 pg (ref 26.6–33.0)
MCHC: 32.8 g/dL (ref 31.5–35.7)
MCV: 88 fL (ref 79–97)
Platelets: 330 x10E3/uL (ref 150–450)
RBC: 4.87 x10E6/uL (ref 3.77–5.28)
RDW: 12.9 % (ref 11.7–15.4)
WBC: 7.9 x10E3/uL (ref 3.4–10.8)

## 2024-10-20 LAB — VITAMIN D 25 HYDROXY (VIT D DEFICIENCY, FRACTURES): Vit D, 25-Hydroxy: 23.5 ng/mL — ABNORMAL LOW (ref 30.0–100.0)

## 2024-10-20 NOTE — Telephone Encounter (Unsigned)
 Copied from CRM #[id number]. Topic: Clinical - Medication Refill >> Oct 20, 2024  5:08 PM Zebedee SAUNDERS wrote: Medication: Needles 29G, 8mm and alcohol swab   Has the patient contacted their pharmacy? Yes (Agent: If no, request that the patient contact the pharmacy for the refill. If patient does not wish to contact the pharmacy document the reason why and proceed with request.) (Agent: If yes, when and what did the pharmacy advise?)Pharmacy need script.   This is the patient's preferred pharmacy:   [facility]ORE #[id number] [place], [place] - [address] AT [facility] OF [address] [place] [address] Phone: [phone] Fax: [phone]  Is this the correct pharmacy for this prescription? Yes If no, delete pharmacy and type the correct one.   Has the prescription been filled recently? Yes  Is the patient out of the medication? Yes  Has the patient been seen for an appointment in the last year OR does the patient have an upcoming appointment? Yes  Can we respond through MyChart? Yes  Agent: Please be advised that Rx refills may take up to 3 business days. We ask that you follow-up with your pharmacy.

## 2024-10-21 MED ORDER — BD PEN NEEDLE NANO 2ND GEN 32G X 4 MM MISC: 1.0000 | Freq: Three times a day (TID) | 1 refills | Status: AC

## 2024-10-21 MED ORDER — ALCOHOL SWABS PADS: MEDICATED_PAD | 1 refills | Status: AC

## 2024-10-21 NOTE — Telephone Encounter (Signed)
 Please see requests below which are not on current medication list.

## 2024-10-26 NOTE — Telephone Encounter (Unsigned)
 Copied from CRM [id number]. Topic: Clinical - Medication Refill >> Oct 26, 2024  1:36 PM Olam RAMAN wrote: Medication: oxyCODONE  (OXY IR/ROXICODONE ) 5 MG immediate release tablet   Has the patient contacted their pharmacy? Yes (Agent: If no, request that the patient contact the pharmacy for the refill. If patient does not wish to contact the pharmacy document the reason why and proceed with request.) (Agent: If yes, when and what did the pharmacy advise?)  This is the patient's preferred pharmacy:  [facility] [address] [facility] - [place], [place] - [address] [facility] AT [facility] [address] [place] [address] Phone: [phone] Fax: [phone]  [facility]ORE #[id number] [place], [place] - [address] AT [facility] OF [address] [place] [address] Phone: [phone] Fax: [phone]  [facility] - [place], [place] - [address] [place] [address] Phone: [phone] Fax: [phone]  Is this the correct pharmacy for this prescription? Yes If no, delete pharmacy and type the correct one.   Has the prescription been filled recently? Yes  Is the patient out of the medication? Yes  Has the patient been seen for an appointment in the last year OR does the patient have an upcoming appointment? Yes  Can we respond through MyChart? Yes  Agent: Please be advised that Rx refills may take up to 3 business days. We ask that you follow-up with your pharmacy.

## 2024-10-26 NOTE — Addendum Note (Signed)
 Addended by: LONZO GARRE A on: 10/26/2024 04:39 PM   Modules accepted: Orders

## 2024-10-27 MED ORDER — OXYCODONE HCL 5 MG PO TABS: 7.5000 mg | ORAL_TABLET | Freq: Three times a day (TID) | ORAL | 0 refills | Status: DC

## 2024-10-27 NOTE — Addendum Note (Signed)
 Addended by: GASPER NANCYANN BRAVO on: 10/27/2024 07:29 AM   Modules accepted: Orders

## 2024-11-01 ENCOUNTER — Telehealth: Payer: Self-pay | Admitting: Family Medicine

## 2024-11-01 DIAGNOSIS — G8921 Chronic pain due to trauma: Secondary | ICD-10-CM

## 2024-11-01 NOTE — Telephone Encounter (Unsigned)
 Copied from CRM #[id number]. Topic: Clinical - Medication Refill >> Nov 01, 2024  9:39 AM Ahlexyia S wrote: Medication: traMADol  (ULTRAM ) 50 MG tablet  Has the patient contacted their pharmacy? Yes, pt was advised to contact clinic. (Agent: If no, request that the patient contact the pharmacy for the refill. If patient does not wish to contact the pharmacy document the reason why and proceed with request.) (Agent: If yes, when and what did the pharmacy advise?)  This is the patient's preferred pharmacy:   [facility] STORE #[id number] [place], [place] - [address] AT [facility] OF [address] [place] [address] Phone: [phone] Fax: [phone]  Is this the correct pharmacy for this prescription? Yes If no, delete pharmacy and type the correct one.   Has the prescription been filled recently? No  Is the patient out of the medication? Yes  Has the patient been seen for an appointment in the last year OR does the patient have an upcoming appointment? Yes  Can we respond through MyChart? Yes  Agent: Please be advised that Rx refills may take up to 3 business days. We ask that you follow-up with your pharmacy.

## 2024-11-04 NOTE — Telephone Encounter (Signed)
 Requested medication (s) are due for refill today: yes  Requested medication (s) are on the active medication list: yes  Last refill:  08/21/21  Future visit scheduled: yes  Notes to clinic:  Unable to refill per protocol, cannot delegate.      Requested Prescriptions  Pending Prescriptions Disp Refills   traMADol  (ULTRAM ) 50 MG tablet 90 tablet 2     Not Delegated - Analgesics:  Opioid Agonists Failed - 11/04/2024 11:37 AM      Failed - This refill cannot be delegated      Failed - Urine Drug Screen completed in last 360 days      Passed - Valid encounter within last 3 months    Recent Outpatient Visits           1 month ago Type 2 diabetes mellitus with hyperlipidemia [facility])   [facility] Gasper Nancyann BRAVO, MD   7 months ago Type 2 diabetes mellitus with hyperlipidemia [facility])   [facility] Gas[place] Nancyann BRAVO, MD

## 2024-11-05 MED ORDER — TRAMADOL HCL 50 MG PO TABS: 50.0000 mg | ORAL_TABLET | Freq: Three times a day (TID) | ORAL | 1 refills | Status: AC | PRN

## 2024-11-22 ENCOUNTER — Other Ambulatory Visit: Payer: Self-pay | Admitting: Family Medicine

## 2024-11-22 DIAGNOSIS — G8921 Chronic pain due to trauma: Secondary | ICD-10-CM

## 2024-11-22 NOTE — Telephone Encounter (Signed)
 Copied from CRM #[id number]. Topic: Clinical - Medication Refill >> Nov 22, 2024 10:52 AM Tiffany B wrote: Medication: oxyCODONE  (OXY IR/ROXICODONE ) 5 MG immediate release tablet  Has the patient contacted their pharmacy? No  (Agent: If yes, when and what did the pharmacy advise?) Contact PCP office   This is the patient's preferred pharmacy:    [facility] DR[facility] STORE #[id number] [place], [place] - [address] AT [facility] OF [address] [place] [address] Phone: [phone] Fax: [phone]    Has the prescription been filled recently? Yes  Is the patient out of the medication? No, will be out in 2 days  Has the patient been seen for an appointment in the last year OR does the patient have an upcoming appointment? Yes  Can we respond through MyChart? No   Agent: Please be advised that Rx refills may take up to 3 business days. We ask that you follow-up with your pharmacy.

## 2024-11-24 NOTE — Telephone Encounter (Signed)
 Requested medication (s) are due for refill today: yes  Requested medication (s) are on the active medication list: yes  Last refill:  10/27/24 #135  Future visit scheduled: yes  Notes to clinic:  Please review for refill. Refill not delegated per protocol.     Requested Prescriptions  Pending Prescriptions Disp Refills   oxyCODONE  (OXY IR/ROXICODONE ) 5 MG immediate release tablet 135 tablet 0    Sig: Take 1.5 tablets (7.5 mg total) by mouth 3 (three) times daily.     Not Delegated - Analgesics:  Opioid Agonists Failed - 11/24/2024 11:46 AM      Failed - This refill cannot be delegated      Failed - Urine Drug Screen completed in last 360 days      Passed - Valid encounter within last 3 months    Recent Outpatient Visits           1 month ago Type 2 diabetes mellitus with hyperlipidemia [facility])   [place] [facility] [place] Nancyann BRAVO, MD   8 months ago Type 2 diabetes mellitus with hyperlipidemia [facility])   [place] [facility] Gasper Nancyann BRAVO, MD

## 2024-11-26 MED ORDER — OXYCODONE HCL 5 MG PO TABS: 7.5000 mg | ORAL_TABLET | Freq: Three times a day (TID) | ORAL | 0 refills | Status: DC

## 2024-12-13 ENCOUNTER — Other Ambulatory Visit: Payer: Self-pay | Admitting: Family Medicine

## 2024-12-13 NOTE — Telephone Encounter (Unsigned)
 Copied from CRM #[id number]. Topic: Clinical - Medication Refill >> Dec 13, 2024  4:52 PM Delon T wrote: Medication: ALPRAZolam  (XANAX ) 0.5 MG tablet-  need full script this time, 8 not enough  Has the patient contacted their pharmacy? Yes (Agent: If no, request that the patient contact the pharmacy for the refill. If patient does not wish to contact the pharmacy document the reason why and proceed with request.) (Agent: If yes, when and what did the pharmacy advise?)  This is the patient's preferred pharmacy:    [facility] STORE #[id number] [place], [place] - [address] AT [facility] OF [address] [place] [address] Phone: [phone] Fax: [phone]    Is this the correct pharmacy for this prescription? Yes If no, delete pharmacy and type the correct one.   Has the prescription been filled recently? Yes  Is the patient out of the medication? Yes  Has the patient been seen for an appointment in the last year OR does the patient have an upcoming appointment? Yes  Can we respond through MyChart? Yes  Agent: Please be advised that Rx refills may take up to 3 business days. We ask that you follow-up with your pharmacy.

## 2024-12-14 ENCOUNTER — Other Ambulatory Visit (HOSPITAL_COMMUNITY): Payer: Self-pay

## 2024-12-14 ENCOUNTER — Telehealth: Payer: Self-pay

## 2024-12-14 NOTE — Telephone Encounter (Signed)
 Pharmacy Patient Advocate Encounter   Received notification from Physician's Office that prior authorization for MOUNJARO  is required/requested.   Insurance verification completed.   The patient is insured through [employer].   Per test claim: The current 28 day co-pay is, $12.65.  No PA needed at this time. This test claim was processed through [facility]- copay amounts may vary at other pharmacies due to pharmacy/plan contracts, or as the patient moves through the different stages of their insurance plan.

## 2024-12-14 NOTE — Telephone Encounter (Signed)
 Copied from CRM #[id number]. Topic: Clinical - Medication Prior Auth >> Dec 13, 2024  4:46 PM Delon DASEN wrote: Reason for CRM: tirzepatide  (MOUNJARO ) 10 MG/0.5ML Pen- need PA for new insurance plan

## 2024-12-14 NOTE — Telephone Encounter (Signed)
 Requested medication (s) are due for refill today: yes  Requested medication (s) are on the active medication list: yes  Last refill:  10/16/24  Future visit scheduled: {Yes  Notes to clinic:  Unable to refill per protocol, cannot delegate.      Requested Prescriptions  Pending Prescriptions Disp Refills   ALPRAZolam  (XANAX ) 0.5 MG tablet 8 tablet 0     Not Delegated - Psychiatry: Anxiolytics/Hypnotics 2 Failed - 12/14/2024  3:21 PM      Failed - This refill cannot be delegated      Failed - Urine Drug Screen completed in last 360 days      Passed - Patient is not pregnant      Passed - Valid encounter within last 6 months    Recent Outpatient Visits           2 months ago Type 2 diabetes mellitus with hyperlipidemia [facility])   [place] Nancyann BRAVO, MD   9 months ago Type 2 diabetes mellitus with hyperlipidemia [facility])   [facility] Gasper, Nancyann BRAVO, MD

## 2024-12-15 MED ORDER — ALPRAZOLAM 0.5 MG PO TABS: 0.5000 mg | ORAL_TABLET | Freq: Every day | ORAL | 1 refills | Status: AC | PRN

## 2024-12-21 NOTE — Telephone Encounter (Signed)
 Pharmacy Patient Advocate Encounter  Received notification from [facility] that Prior Authorization for  MOUNJARO  10MG /0.5ML has been APPROVED from 12/14/2024 to 12/01/2025.   PA #/Case ID/Reference #:[id number]

## 2024-12-22 ENCOUNTER — Telehealth: Payer: Self-pay

## 2024-12-22 DIAGNOSIS — G8921 Chronic pain due to trauma: Secondary | ICD-10-CM

## 2024-12-22 MED ORDER — OXYCODONE HCL 5 MG PO TABS: 7.5000 mg | ORAL_TABLET | Freq: Three times a day (TID) | ORAL | 0 refills | Status: AC

## 2024-12-22 NOTE — Telephone Encounter (Signed)
 Copied from CRM [id number]. Topic: Clinical - Medication Refill >> Dec 22, 2024 12:31 PM Everette C wrote: Medication: oxyCODONE  (OXY IR/ROXICODONE ) 5 MG immediate release tablet [[id number]]  Has the patient contacted their pharmacy? Yes (Agent: If no, request that the patient contact the pharmacy for the refill. If patient does not wish to contact the pharmacy document the reason why and proceed with request.) (Agent: If yes, when and what did the pharmacy advise?)  This is the patient's preferred pharmacy:  [facility]ORE #[id number] [place], [place] - [address] AT [facility] OF [address] [place] [address] Phone: [phone] Fax: [phone]   Is this the correct pharmacy for this prescription? Yes If no, delete pharmacy and type the correct one.   Has the prescription been filled recently? Yes  Is the patient out of the medication? Yes  Has the patient been seen for an appointment in the last year OR does the patient have an upcoming appointment? Yes  Can we respond through MyChart? No  Agent: Please be advised that Rx refills may take up to 3 business days. We ask that you follow-up with your pharmacy.

## 2025-01-02 ENCOUNTER — Emergency Department

## 2025-01-02 ENCOUNTER — Other Ambulatory Visit: Payer: Self-pay

## 2025-01-02 ENCOUNTER — Emergency Department
Admission: EM | Admit: 2025-01-02 | Discharge: 2025-01-02 | Disposition: A | Discharge status: Home / Self Care | Attending: Emergency Medicine | Admitting: Emergency Medicine

## 2025-01-02 DIAGNOSIS — S161XXA Strain of muscle, fascia and tendon at neck level, initial encounter: Secondary | ICD-10-CM | POA: Insufficient documentation

## 2025-01-02 DIAGNOSIS — Y92410 Unspecified street and highway as the place of occurrence of the external cause: Secondary | ICD-10-CM | POA: Insufficient documentation

## 2025-01-02 DIAGNOSIS — I10 Essential (primary) hypertension: Secondary | ICD-10-CM | POA: Insufficient documentation

## 2025-01-02 DIAGNOSIS — E119 Type 2 diabetes mellitus without complications: Secondary | ICD-10-CM | POA: Insufficient documentation

## 2025-01-02 DIAGNOSIS — R519 Headache, unspecified: Secondary | ICD-10-CM | POA: Insufficient documentation

## 2025-01-02 DIAGNOSIS — M25512 Pain in left shoulder: Secondary | ICD-10-CM | POA: Insufficient documentation

## 2025-01-02 DIAGNOSIS — R110 Nausea: Secondary | ICD-10-CM | POA: Insufficient documentation

## 2025-01-02 LAB — POC URINE PREG, ED: Preg Test, Ur: NEGATIVE

## 2025-01-02 MED ORDER — METHOCARBAMOL 500 MG PO TABS
500.0000 mg | ORAL_TABLET | Freq: Once | ORAL | Status: AC
  Administered 2025-01-02: 500 mg via ORAL
  Filled 2025-01-02: qty 1

## 2025-01-02 MED ORDER — ONDANSETRON 4 MG PO TBDP
4.0000 mg | ORAL_TABLET | Freq: Once | ORAL | Status: AC
  Administered 2025-01-02: 4 mg via ORAL
  Filled 2025-01-02: qty 1

## 2025-01-02 MED ORDER — IBUPROFEN 800 MG PO TABS
800.0000 mg | ORAL_TABLET | Freq: Once | ORAL | Status: AC
  Administered 2025-01-02: 800 mg via ORAL
  Filled 2025-01-02: qty 1

## 2025-01-02 MED ORDER — METHOCARBAMOL 500 MG PO TABS: 500.0000 mg | ORAL_TABLET | Freq: Three times a day (TID) | ORAL | 0 refills | Status: AC | PRN

## 2025-01-02 MED ORDER — METHOCARBAMOL 500 MG PO TABS: 500.0000 mg | ORAL_TABLET | Freq: Once | ORAL | Status: DC
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
## 2025-02-09 ENCOUNTER — Ambulatory Visit
